# Patient Record
Sex: Male | Born: 1935 | Race: White | Hispanic: No | Marital: Married | Smoking: Never smoker
Health system: Southern US, Community
[De-identification: ages and names within clinical notes are randomized; demographics above are authoritative.]

## PROBLEM LIST (undated history)

## (undated) DIAGNOSIS — I639 Cerebral infarction, unspecified: Secondary | ICD-10-CM

## (undated) DIAGNOSIS — E119 Type 2 diabetes mellitus without complications: Secondary | ICD-10-CM

## (undated) DIAGNOSIS — C61 Malignant neoplasm of prostate: Secondary | ICD-10-CM

## (undated) DIAGNOSIS — C801 Malignant (primary) neoplasm, unspecified: Secondary | ICD-10-CM

## (undated) DIAGNOSIS — F32A Depression, unspecified: Secondary | ICD-10-CM

## (undated) DIAGNOSIS — F329 Major depressive disorder, single episode, unspecified: Secondary | ICD-10-CM

## (undated) HISTORY — DX: Type 2 diabetes mellitus without complications: E11.9

## (undated) HISTORY — DX: Cerebral infarction, unspecified: I63.9

## (undated) HISTORY — DX: Major depressive disorder, single episode, unspecified: F32.9

## (undated) HISTORY — PX: APPENDECTOMY: SHX54

## (undated) HISTORY — PX: CHOLECYSTECTOMY: SHX55

## (undated) HISTORY — DX: Malignant (primary) neoplasm, unspecified: C80.1

## (undated) HISTORY — PX: PROSTATECTOMY: SHX69

## (undated) HISTORY — DX: Depression, unspecified: F32.A

---

## 2014-11-19 ENCOUNTER — Encounter: Payer: Self-pay | Admitting: Family Medicine

## 2015-02-02 ENCOUNTER — Encounter: Payer: Self-pay | Admitting: Family Medicine

## 2015-04-13 ENCOUNTER — Encounter: Payer: Self-pay | Admitting: Family Medicine

## 2015-04-13 DIAGNOSIS — E118 Type 2 diabetes mellitus with unspecified complications: Secondary | ICD-10-CM | POA: Diagnosis not present

## 2015-04-13 DIAGNOSIS — Z9181 History of falling: Secondary | ICD-10-CM | POA: Diagnosis not present

## 2015-04-13 LAB — GLUCOSE, POCT (MANUAL RESULT ENTRY): POC Glucose: 130 mg/dl — AB (ref 70–99)

## 2015-05-14 DIAGNOSIS — I7091 Generalized atherosclerosis: Secondary | ICD-10-CM | POA: Diagnosis not present

## 2015-05-14 DIAGNOSIS — L602 Onychogryphosis: Secondary | ICD-10-CM | POA: Diagnosis not present

## 2015-05-14 DIAGNOSIS — B351 Tinea unguium: Secondary | ICD-10-CM | POA: Diagnosis not present

## 2015-05-14 DIAGNOSIS — M79674 Pain in right toe(s): Secondary | ICD-10-CM | POA: Diagnosis not present

## 2015-05-14 DIAGNOSIS — E119 Type 2 diabetes mellitus without complications: Secondary | ICD-10-CM | POA: Diagnosis not present

## 2015-05-14 DIAGNOSIS — M79675 Pain in left toe(s): Secondary | ICD-10-CM | POA: Diagnosis not present

## 2015-06-18 DIAGNOSIS — K1379 Other lesions of oral mucosa: Secondary | ICD-10-CM | POA: Diagnosis not present

## 2015-07-08 ENCOUNTER — Encounter: Payer: Self-pay | Admitting: Family Medicine

## 2015-07-08 DIAGNOSIS — E118 Type 2 diabetes mellitus with unspecified complications: Secondary | ICD-10-CM | POA: Diagnosis not present

## 2015-07-14 DIAGNOSIS — E1165 Type 2 diabetes mellitus with hyperglycemia: Secondary | ICD-10-CM | POA: Diagnosis not present

## 2015-07-14 DIAGNOSIS — I10 Essential (primary) hypertension: Secondary | ICD-10-CM | POA: Diagnosis not present

## 2015-07-16 DIAGNOSIS — L602 Onychogryphosis: Secondary | ICD-10-CM | POA: Diagnosis not present

## 2015-07-16 DIAGNOSIS — I7091 Generalized atherosclerosis: Secondary | ICD-10-CM | POA: Diagnosis not present

## 2015-07-16 DIAGNOSIS — M79674 Pain in right toe(s): Secondary | ICD-10-CM | POA: Diagnosis not present

## 2015-07-16 DIAGNOSIS — E119 Type 2 diabetes mellitus without complications: Secondary | ICD-10-CM | POA: Diagnosis not present

## 2015-07-16 DIAGNOSIS — M79675 Pain in left toe(s): Secondary | ICD-10-CM | POA: Diagnosis not present

## 2015-07-16 DIAGNOSIS — B351 Tinea unguium: Secondary | ICD-10-CM | POA: Diagnosis not present

## 2015-09-18 DIAGNOSIS — K5909 Other constipation: Secondary | ICD-10-CM | POA: Diagnosis not present

## 2015-09-18 DIAGNOSIS — Z86010 Personal history of colonic polyps: Secondary | ICD-10-CM | POA: Diagnosis not present

## 2015-09-18 DIAGNOSIS — Z800 Family history of malignant neoplasm of digestive organs: Secondary | ICD-10-CM | POA: Diagnosis not present

## 2015-09-22 DIAGNOSIS — M79674 Pain in right toe(s): Secondary | ICD-10-CM | POA: Diagnosis not present

## 2015-09-22 DIAGNOSIS — B351 Tinea unguium: Secondary | ICD-10-CM | POA: Diagnosis not present

## 2015-09-22 DIAGNOSIS — L602 Onychogryphosis: Secondary | ICD-10-CM | POA: Diagnosis not present

## 2015-09-22 DIAGNOSIS — M79675 Pain in left toe(s): Secondary | ICD-10-CM | POA: Diagnosis not present

## 2015-09-22 DIAGNOSIS — I7091 Generalized atherosclerosis: Secondary | ICD-10-CM | POA: Diagnosis not present

## 2015-09-22 DIAGNOSIS — E119 Type 2 diabetes mellitus without complications: Secondary | ICD-10-CM | POA: Diagnosis not present

## 2015-10-06 ENCOUNTER — Encounter: Payer: Self-pay | Admitting: Family Medicine

## 2015-10-06 DIAGNOSIS — C61 Malignant neoplasm of prostate: Secondary | ICD-10-CM | POA: Diagnosis not present

## 2015-10-06 DIAGNOSIS — E1165 Type 2 diabetes mellitus with hyperglycemia: Secondary | ICD-10-CM | POA: Diagnosis not present

## 2015-10-13 ENCOUNTER — Encounter: Payer: Self-pay | Admitting: Family Medicine

## 2015-10-13 DIAGNOSIS — R5383 Other fatigue: Secondary | ICD-10-CM | POA: Diagnosis not present

## 2015-10-13 DIAGNOSIS — I10 Essential (primary) hypertension: Secondary | ICD-10-CM | POA: Diagnosis not present

## 2015-10-13 DIAGNOSIS — E1165 Type 2 diabetes mellitus with hyperglycemia: Secondary | ICD-10-CM | POA: Diagnosis not present

## 2015-10-28 DIAGNOSIS — S20379A Other superficial bite of unspecified front wall of thorax, initial encounter: Secondary | ICD-10-CM | POA: Diagnosis not present

## 2015-10-28 DIAGNOSIS — E119 Type 2 diabetes mellitus without complications: Secondary | ICD-10-CM | POA: Diagnosis not present

## 2015-10-28 DIAGNOSIS — Z7982 Long term (current) use of aspirin: Secondary | ICD-10-CM | POA: Diagnosis not present

## 2015-10-28 DIAGNOSIS — S80861A Insect bite (nonvenomous), right lower leg, initial encounter: Secondary | ICD-10-CM | POA: Diagnosis not present

## 2015-10-28 DIAGNOSIS — Z7952 Long term (current) use of systemic steroids: Secondary | ICD-10-CM | POA: Diagnosis not present

## 2015-10-28 DIAGNOSIS — S80862A Insect bite (nonvenomous), left lower leg, initial encounter: Secondary | ICD-10-CM | POA: Diagnosis not present

## 2015-10-29 DIAGNOSIS — S0086XA Insect bite (nonvenomous) of other part of head, initial encounter: Secondary | ICD-10-CM | POA: Diagnosis not present

## 2015-10-29 DIAGNOSIS — W57XXXA Bitten or stung by nonvenomous insect and other nonvenomous arthropods, initial encounter: Secondary | ICD-10-CM | POA: Diagnosis not present

## 2015-10-29 DIAGNOSIS — Z9181 History of falling: Secondary | ICD-10-CM | POA: Diagnosis not present

## 2015-11-17 ENCOUNTER — Encounter: Payer: Self-pay | Admitting: Family Medicine

## 2015-11-17 DIAGNOSIS — K5900 Constipation, unspecified: Secondary | ICD-10-CM | POA: Diagnosis not present

## 2015-11-17 DIAGNOSIS — Z800 Family history of malignant neoplasm of digestive organs: Secondary | ICD-10-CM | POA: Diagnosis not present

## 2015-11-17 DIAGNOSIS — E119 Type 2 diabetes mellitus without complications: Secondary | ICD-10-CM | POA: Diagnosis not present

## 2015-11-17 DIAGNOSIS — Z794 Long term (current) use of insulin: Secondary | ICD-10-CM | POA: Diagnosis not present

## 2015-11-17 DIAGNOSIS — K5730 Diverticulosis of large intestine without perforation or abscess without bleeding: Secondary | ICD-10-CM | POA: Diagnosis not present

## 2015-11-17 DIAGNOSIS — Z86010 Personal history of colonic polyps: Secondary | ICD-10-CM | POA: Diagnosis not present

## 2015-11-24 DIAGNOSIS — B351 Tinea unguium: Secondary | ICD-10-CM | POA: Diagnosis not present

## 2015-11-24 DIAGNOSIS — M79675 Pain in left toe(s): Secondary | ICD-10-CM | POA: Diagnosis not present

## 2015-11-24 DIAGNOSIS — L602 Onychogryphosis: Secondary | ICD-10-CM | POA: Diagnosis not present

## 2015-11-24 DIAGNOSIS — I7091 Generalized atherosclerosis: Secondary | ICD-10-CM | POA: Diagnosis not present

## 2015-11-24 DIAGNOSIS — E119 Type 2 diabetes mellitus without complications: Secondary | ICD-10-CM | POA: Diagnosis not present

## 2015-11-24 DIAGNOSIS — M79674 Pain in right toe(s): Secondary | ICD-10-CM | POA: Diagnosis not present

## 2016-01-13 ENCOUNTER — Encounter: Payer: Self-pay | Admitting: Family Medicine

## 2016-01-13 DIAGNOSIS — E1165 Type 2 diabetes mellitus with hyperglycemia: Secondary | ICD-10-CM | POA: Diagnosis not present

## 2016-01-20 DIAGNOSIS — E1165 Type 2 diabetes mellitus with hyperglycemia: Secondary | ICD-10-CM | POA: Diagnosis not present

## 2016-01-20 DIAGNOSIS — Z794 Long term (current) use of insulin: Secondary | ICD-10-CM | POA: Diagnosis not present

## 2016-01-20 DIAGNOSIS — I10 Essential (primary) hypertension: Secondary | ICD-10-CM | POA: Diagnosis not present

## 2016-01-20 DIAGNOSIS — L821 Other seborrheic keratosis: Secondary | ICD-10-CM | POA: Diagnosis not present

## 2016-01-25 DIAGNOSIS — E1165 Type 2 diabetes mellitus with hyperglycemia: Secondary | ICD-10-CM | POA: Diagnosis not present

## 2016-01-25 DIAGNOSIS — E118 Type 2 diabetes mellitus with unspecified complications: Secondary | ICD-10-CM | POA: Diagnosis not present

## 2016-01-25 DIAGNOSIS — H40003 Preglaucoma, unspecified, bilateral: Secondary | ICD-10-CM | POA: Diagnosis not present

## 2016-01-26 DIAGNOSIS — E119 Type 2 diabetes mellitus without complications: Secondary | ICD-10-CM | POA: Diagnosis not present

## 2016-01-26 DIAGNOSIS — I7091 Generalized atherosclerosis: Secondary | ICD-10-CM | POA: Diagnosis not present

## 2016-01-26 DIAGNOSIS — B351 Tinea unguium: Secondary | ICD-10-CM | POA: Diagnosis not present

## 2016-01-26 DIAGNOSIS — L602 Onychogryphosis: Secondary | ICD-10-CM | POA: Diagnosis not present

## 2016-01-26 DIAGNOSIS — M79675 Pain in left toe(s): Secondary | ICD-10-CM | POA: Diagnosis not present

## 2016-01-26 DIAGNOSIS — M79674 Pain in right toe(s): Secondary | ICD-10-CM | POA: Diagnosis not present

## 2016-04-05 DIAGNOSIS — B351 Tinea unguium: Secondary | ICD-10-CM | POA: Diagnosis not present

## 2016-04-05 DIAGNOSIS — M79675 Pain in left toe(s): Secondary | ICD-10-CM | POA: Diagnosis not present

## 2016-04-05 DIAGNOSIS — L602 Onychogryphosis: Secondary | ICD-10-CM | POA: Diagnosis not present

## 2016-04-05 DIAGNOSIS — I7091 Generalized atherosclerosis: Secondary | ICD-10-CM | POA: Diagnosis not present

## 2016-04-05 DIAGNOSIS — E119 Type 2 diabetes mellitus without complications: Secondary | ICD-10-CM | POA: Diagnosis not present

## 2016-04-05 DIAGNOSIS — M79674 Pain in right toe(s): Secondary | ICD-10-CM | POA: Diagnosis not present

## 2016-04-14 ENCOUNTER — Encounter: Payer: Self-pay | Admitting: Family Medicine

## 2016-04-14 DIAGNOSIS — E1165 Type 2 diabetes mellitus with hyperglycemia: Secondary | ICD-10-CM | POA: Diagnosis not present

## 2016-04-21 DIAGNOSIS — E1165 Type 2 diabetes mellitus with hyperglycemia: Secondary | ICD-10-CM | POA: Diagnosis not present

## 2016-04-21 DIAGNOSIS — E039 Hypothyroidism, unspecified: Secondary | ICD-10-CM | POA: Diagnosis not present

## 2016-06-07 DIAGNOSIS — L602 Onychogryphosis: Secondary | ICD-10-CM | POA: Diagnosis not present

## 2016-06-07 DIAGNOSIS — M79675 Pain in left toe(s): Secondary | ICD-10-CM | POA: Diagnosis not present

## 2016-06-07 DIAGNOSIS — M79674 Pain in right toe(s): Secondary | ICD-10-CM | POA: Diagnosis not present

## 2016-06-07 DIAGNOSIS — E119 Type 2 diabetes mellitus without complications: Secondary | ICD-10-CM | POA: Diagnosis not present

## 2016-06-07 DIAGNOSIS — B351 Tinea unguium: Secondary | ICD-10-CM | POA: Diagnosis not present

## 2016-06-07 DIAGNOSIS — I7091 Generalized atherosclerosis: Secondary | ICD-10-CM | POA: Diagnosis not present

## 2016-06-08 DIAGNOSIS — D485 Neoplasm of uncertain behavior of skin: Secondary | ICD-10-CM | POA: Diagnosis not present

## 2016-06-08 DIAGNOSIS — L659 Nonscarring hair loss, unspecified: Secondary | ICD-10-CM | POA: Diagnosis not present

## 2016-06-08 DIAGNOSIS — L817 Pigmented purpuric dermatosis: Secondary | ICD-10-CM | POA: Diagnosis not present

## 2016-06-08 DIAGNOSIS — L821 Other seborrheic keratosis: Secondary | ICD-10-CM | POA: Diagnosis not present

## 2016-06-08 DIAGNOSIS — D3610 Benign neoplasm of peripheral nerves and autonomic nervous system, unspecified: Secondary | ICD-10-CM | POA: Diagnosis not present

## 2016-06-08 DIAGNOSIS — L918 Other hypertrophic disorders of the skin: Secondary | ICD-10-CM | POA: Diagnosis not present

## 2016-06-08 DIAGNOSIS — L853 Xerosis cutis: Secondary | ICD-10-CM | POA: Diagnosis not present

## 2016-06-08 DIAGNOSIS — L851 Acquired keratosis [keratoderma] palmaris et plantaris: Secondary | ICD-10-CM | POA: Diagnosis not present

## 2016-06-08 DIAGNOSIS — L570 Actinic keratosis: Secondary | ICD-10-CM | POA: Diagnosis not present

## 2016-06-08 DIAGNOSIS — L814 Other melanin hyperpigmentation: Secondary | ICD-10-CM | POA: Diagnosis not present

## 2016-06-08 DIAGNOSIS — L812 Freckles: Secondary | ICD-10-CM | POA: Diagnosis not present

## 2016-06-08 DIAGNOSIS — L578 Other skin changes due to chronic exposure to nonionizing radiation: Secondary | ICD-10-CM | POA: Diagnosis not present

## 2016-07-20 ENCOUNTER — Encounter: Payer: Self-pay | Admitting: Family Medicine

## 2016-07-20 DIAGNOSIS — E1165 Type 2 diabetes mellitus with hyperglycemia: Secondary | ICD-10-CM | POA: Diagnosis not present

## 2016-07-27 DIAGNOSIS — E1165 Type 2 diabetes mellitus with hyperglycemia: Secondary | ICD-10-CM | POA: Diagnosis not present

## 2016-07-27 DIAGNOSIS — N5234 Erectile dysfunction following simple prostatectomy: Secondary | ICD-10-CM | POA: Diagnosis not present

## 2016-07-27 DIAGNOSIS — E039 Hypothyroidism, unspecified: Secondary | ICD-10-CM | POA: Diagnosis not present

## 2016-07-27 DIAGNOSIS — Z794 Long term (current) use of insulin: Secondary | ICD-10-CM | POA: Diagnosis not present

## 2016-08-16 DIAGNOSIS — B351 Tinea unguium: Secondary | ICD-10-CM | POA: Diagnosis not present

## 2016-08-16 DIAGNOSIS — I7091 Generalized atherosclerosis: Secondary | ICD-10-CM | POA: Diagnosis not present

## 2016-08-16 DIAGNOSIS — M79675 Pain in left toe(s): Secondary | ICD-10-CM | POA: Diagnosis not present

## 2016-08-16 DIAGNOSIS — E119 Type 2 diabetes mellitus without complications: Secondary | ICD-10-CM | POA: Diagnosis not present

## 2016-08-16 DIAGNOSIS — M79674 Pain in right toe(s): Secondary | ICD-10-CM | POA: Diagnosis not present

## 2016-08-16 DIAGNOSIS — L602 Onychogryphosis: Secondary | ICD-10-CM | POA: Diagnosis not present

## 2016-09-01 DIAGNOSIS — N5089 Other specified disorders of the male genital organs: Secondary | ICD-10-CM | POA: Diagnosis not present

## 2016-09-09 ENCOUNTER — Encounter: Payer: Self-pay | Admitting: Family Medicine

## 2016-09-09 DIAGNOSIS — N5089 Other specified disorders of the male genital organs: Secondary | ICD-10-CM | POA: Diagnosis not present

## 2016-09-09 DIAGNOSIS — N433 Hydrocele, unspecified: Secondary | ICD-10-CM | POA: Diagnosis not present

## 2016-09-13 DIAGNOSIS — N5231 Erectile dysfunction following radical prostatectomy: Secondary | ICD-10-CM | POA: Diagnosis not present

## 2016-09-13 DIAGNOSIS — Z8546 Personal history of malignant neoplasm of prostate: Secondary | ICD-10-CM | POA: Diagnosis not present

## 2016-10-18 DIAGNOSIS — E119 Type 2 diabetes mellitus without complications: Secondary | ICD-10-CM | POA: Diagnosis not present

## 2016-10-18 DIAGNOSIS — B351 Tinea unguium: Secondary | ICD-10-CM | POA: Diagnosis not present

## 2016-10-18 DIAGNOSIS — M79674 Pain in right toe(s): Secondary | ICD-10-CM | POA: Diagnosis not present

## 2016-10-18 DIAGNOSIS — I7091 Generalized atherosclerosis: Secondary | ICD-10-CM | POA: Diagnosis not present

## 2016-10-18 DIAGNOSIS — M79675 Pain in left toe(s): Secondary | ICD-10-CM | POA: Diagnosis not present

## 2016-10-18 DIAGNOSIS — L602 Onychogryphosis: Secondary | ICD-10-CM | POA: Diagnosis not present

## 2016-11-01 ENCOUNTER — Encounter: Payer: Self-pay | Admitting: Family Medicine

## 2016-11-01 DIAGNOSIS — E1165 Type 2 diabetes mellitus with hyperglycemia: Secondary | ICD-10-CM | POA: Diagnosis not present

## 2016-12-05 DIAGNOSIS — R918 Other nonspecific abnormal finding of lung field: Secondary | ICD-10-CM | POA: Diagnosis not present

## 2016-12-05 DIAGNOSIS — Z9079 Acquired absence of other genital organ(s): Secondary | ICD-10-CM | POA: Diagnosis not present

## 2016-12-05 DIAGNOSIS — C61 Malignant neoplasm of prostate: Secondary | ICD-10-CM | POA: Diagnosis not present

## 2016-12-05 DIAGNOSIS — Z79899 Other long term (current) drug therapy: Secondary | ICD-10-CM | POA: Diagnosis not present

## 2016-12-05 DIAGNOSIS — H409 Unspecified glaucoma: Secondary | ICD-10-CM | POA: Diagnosis not present

## 2016-12-05 DIAGNOSIS — I639 Cerebral infarction, unspecified: Secondary | ICD-10-CM | POA: Diagnosis not present

## 2016-12-05 DIAGNOSIS — E118 Type 2 diabetes mellitus with unspecified complications: Secondary | ICD-10-CM | POA: Diagnosis not present

## 2016-12-05 DIAGNOSIS — R51 Headache: Secondary | ICD-10-CM | POA: Diagnosis not present

## 2016-12-05 DIAGNOSIS — N401 Enlarged prostate with lower urinary tract symptoms: Secondary | ICD-10-CM | POA: Diagnosis not present

## 2016-12-05 DIAGNOSIS — I6782 Cerebral ischemia: Secondary | ICD-10-CM | POA: Diagnosis not present

## 2016-12-05 DIAGNOSIS — Z800 Family history of malignant neoplasm of digestive organs: Secondary | ICD-10-CM | POA: Diagnosis not present

## 2016-12-05 DIAGNOSIS — E119 Type 2 diabetes mellitus without complications: Secondary | ICD-10-CM | POA: Diagnosis not present

## 2016-12-05 DIAGNOSIS — Z8546 Personal history of malignant neoplasm of prostate: Secondary | ICD-10-CM | POA: Diagnosis not present

## 2016-12-05 DIAGNOSIS — Z7984 Long term (current) use of oral hypoglycemic drugs: Secondary | ICD-10-CM | POA: Diagnosis not present

## 2016-12-05 DIAGNOSIS — G459 Transient cerebral ischemic attack, unspecified: Secondary | ICD-10-CM | POA: Diagnosis not present

## 2016-12-05 DIAGNOSIS — I160 Hypertensive urgency: Secondary | ICD-10-CM | POA: Diagnosis not present

## 2016-12-05 DIAGNOSIS — M6281 Muscle weakness (generalized): Secondary | ICD-10-CM | POA: Diagnosis not present

## 2016-12-05 DIAGNOSIS — R55 Syncope and collapse: Secondary | ICD-10-CM | POA: Diagnosis not present

## 2016-12-05 DIAGNOSIS — I6300 Cerebral infarction due to thrombosis of unspecified precerebral artery: Secondary | ICD-10-CM | POA: Diagnosis not present

## 2016-12-05 DIAGNOSIS — I10 Essential (primary) hypertension: Secondary | ICD-10-CM | POA: Diagnosis not present

## 2016-12-05 DIAGNOSIS — Z833 Family history of diabetes mellitus: Secondary | ICD-10-CM | POA: Diagnosis not present

## 2016-12-05 DIAGNOSIS — Z9010 Acquired absence of unspecified breast and nipple: Secondary | ICD-10-CM | POA: Diagnosis not present

## 2016-12-05 DIAGNOSIS — R200 Anesthesia of skin: Secondary | ICD-10-CM | POA: Diagnosis not present

## 2016-12-05 DIAGNOSIS — J90 Pleural effusion, not elsewhere classified: Secondary | ICD-10-CM | POA: Diagnosis not present

## 2016-12-05 DIAGNOSIS — H53461 Homonymous bilateral field defects, right side: Secondary | ICD-10-CM | POA: Diagnosis present

## 2016-12-05 DIAGNOSIS — S2239XS Fracture of one rib, unspecified side, sequela: Secondary | ICD-10-CM | POA: Diagnosis not present

## 2016-12-05 DIAGNOSIS — Z823 Family history of stroke: Secondary | ICD-10-CM | POA: Diagnosis not present

## 2016-12-05 DIAGNOSIS — R29700 NIHSS score 0: Secondary | ICD-10-CM | POA: Diagnosis present

## 2016-12-05 DIAGNOSIS — R4781 Slurred speech: Secondary | ICD-10-CM | POA: Diagnosis not present

## 2016-12-05 DIAGNOSIS — T1490XA Injury, unspecified, initial encounter: Secondary | ICD-10-CM | POA: Diagnosis not present

## 2016-12-05 DIAGNOSIS — Z7982 Long term (current) use of aspirin: Secondary | ICD-10-CM | POA: Diagnosis not present

## 2016-12-05 DIAGNOSIS — R32 Unspecified urinary incontinence: Secondary | ICD-10-CM | POA: Diagnosis present

## 2016-12-06 ENCOUNTER — Encounter: Payer: Self-pay | Admitting: Family Medicine

## 2016-12-09 DIAGNOSIS — H409 Unspecified glaucoma: Secondary | ICD-10-CM | POA: Diagnosis not present

## 2016-12-09 DIAGNOSIS — D691 Qualitative platelet defects: Secondary | ICD-10-CM | POA: Diagnosis not present

## 2016-12-09 DIAGNOSIS — Z9889 Other specified postprocedural states: Secondary | ICD-10-CM | POA: Diagnosis not present

## 2016-12-09 DIAGNOSIS — E785 Hyperlipidemia, unspecified: Secondary | ICD-10-CM | POA: Diagnosis not present

## 2016-12-09 DIAGNOSIS — I63529 Cerebral infarction due to unspecified occlusion or stenosis of unspecified anterior cerebral artery: Secondary | ICD-10-CM | POA: Diagnosis not present

## 2016-12-09 DIAGNOSIS — Z7984 Long term (current) use of oral hypoglycemic drugs: Secondary | ICD-10-CM | POA: Diagnosis not present

## 2016-12-09 DIAGNOSIS — I10 Essential (primary) hypertension: Secondary | ICD-10-CM | POA: Diagnosis not present

## 2016-12-09 DIAGNOSIS — T1490XA Injury, unspecified, initial encounter: Secondary | ICD-10-CM | POA: Diagnosis not present

## 2016-12-09 DIAGNOSIS — R51 Headache: Secondary | ICD-10-CM | POA: Diagnosis not present

## 2016-12-09 DIAGNOSIS — E119 Type 2 diabetes mellitus without complications: Secondary | ICD-10-CM | POA: Diagnosis not present

## 2016-12-09 DIAGNOSIS — C61 Malignant neoplasm of prostate: Secondary | ICD-10-CM | POA: Diagnosis not present

## 2016-12-09 DIAGNOSIS — Z743 Need for continuous supervision: Secondary | ICD-10-CM | POA: Diagnosis not present

## 2016-12-09 DIAGNOSIS — R079 Chest pain, unspecified: Secondary | ICD-10-CM | POA: Diagnosis not present

## 2016-12-09 DIAGNOSIS — Z7902 Long term (current) use of antithrombotics/antiplatelets: Secondary | ICD-10-CM | POA: Diagnosis not present

## 2016-12-09 DIAGNOSIS — I6300 Cerebral infarction due to thrombosis of unspecified precerebral artery: Secondary | ICD-10-CM | POA: Diagnosis not present

## 2016-12-09 DIAGNOSIS — M6281 Muscle weakness (generalized): Secondary | ICD-10-CM | POA: Diagnosis not present

## 2016-12-09 DIAGNOSIS — I639 Cerebral infarction, unspecified: Secondary | ICD-10-CM | POA: Diagnosis not present

## 2016-12-09 DIAGNOSIS — R55 Syncope and collapse: Secondary | ICD-10-CM | POA: Diagnosis not present

## 2016-12-09 DIAGNOSIS — S2239XS Fracture of one rib, unspecified side, sequela: Secondary | ICD-10-CM | POA: Diagnosis not present

## 2016-12-10 DIAGNOSIS — I10 Essential (primary) hypertension: Secondary | ICD-10-CM | POA: Diagnosis not present

## 2016-12-10 DIAGNOSIS — I63529 Cerebral infarction due to unspecified occlusion or stenosis of unspecified anterior cerebral artery: Secondary | ICD-10-CM | POA: Diagnosis not present

## 2016-12-10 DIAGNOSIS — Z7902 Long term (current) use of antithrombotics/antiplatelets: Secondary | ICD-10-CM | POA: Diagnosis not present

## 2016-12-10 DIAGNOSIS — I6300 Cerebral infarction due to thrombosis of unspecified precerebral artery: Secondary | ICD-10-CM | POA: Diagnosis not present

## 2016-12-10 DIAGNOSIS — Z7984 Long term (current) use of oral hypoglycemic drugs: Secondary | ICD-10-CM | POA: Diagnosis not present

## 2016-12-10 DIAGNOSIS — R55 Syncope and collapse: Secondary | ICD-10-CM | POA: Diagnosis not present

## 2016-12-10 DIAGNOSIS — D691 Qualitative platelet defects: Secondary | ICD-10-CM | POA: Diagnosis not present

## 2016-12-10 DIAGNOSIS — R51 Headache: Secondary | ICD-10-CM | POA: Diagnosis not present

## 2016-12-10 DIAGNOSIS — E119 Type 2 diabetes mellitus without complications: Secondary | ICD-10-CM | POA: Diagnosis not present

## 2016-12-10 DIAGNOSIS — Z9889 Other specified postprocedural states: Secondary | ICD-10-CM | POA: Diagnosis not present

## 2016-12-10 DIAGNOSIS — T1490XA Injury, unspecified, initial encounter: Secondary | ICD-10-CM | POA: Diagnosis not present

## 2016-12-13 DIAGNOSIS — E785 Hyperlipidemia, unspecified: Secondary | ICD-10-CM | POA: Diagnosis not present

## 2016-12-13 DIAGNOSIS — I10 Essential (primary) hypertension: Secondary | ICD-10-CM | POA: Diagnosis not present

## 2016-12-13 DIAGNOSIS — I639 Cerebral infarction, unspecified: Secondary | ICD-10-CM | POA: Diagnosis not present

## 2016-12-13 DIAGNOSIS — E119 Type 2 diabetes mellitus without complications: Secondary | ICD-10-CM | POA: Diagnosis not present

## 2016-12-16 DIAGNOSIS — E785 Hyperlipidemia, unspecified: Secondary | ICD-10-CM | POA: Diagnosis not present

## 2016-12-16 DIAGNOSIS — E119 Type 2 diabetes mellitus without complications: Secondary | ICD-10-CM | POA: Diagnosis not present

## 2016-12-16 DIAGNOSIS — I10 Essential (primary) hypertension: Secondary | ICD-10-CM | POA: Diagnosis not present

## 2016-12-16 DIAGNOSIS — I639 Cerebral infarction, unspecified: Secondary | ICD-10-CM | POA: Diagnosis not present

## 2016-12-19 DIAGNOSIS — I639 Cerebral infarction, unspecified: Secondary | ICD-10-CM | POA: Diagnosis not present

## 2016-12-19 DIAGNOSIS — Z9181 History of falling: Secondary | ICD-10-CM | POA: Diagnosis not present

## 2016-12-19 DIAGNOSIS — E1165 Type 2 diabetes mellitus with hyperglycemia: Secondary | ICD-10-CM | POA: Diagnosis not present

## 2016-12-19 DIAGNOSIS — Z794 Long term (current) use of insulin: Secondary | ICD-10-CM | POA: Diagnosis not present

## 2016-12-20 DIAGNOSIS — B351 Tinea unguium: Secondary | ICD-10-CM | POA: Diagnosis not present

## 2016-12-20 DIAGNOSIS — M79674 Pain in right toe(s): Secondary | ICD-10-CM | POA: Diagnosis not present

## 2016-12-20 DIAGNOSIS — L602 Onychogryphosis: Secondary | ICD-10-CM | POA: Diagnosis not present

## 2016-12-20 DIAGNOSIS — I7091 Generalized atherosclerosis: Secondary | ICD-10-CM | POA: Diagnosis not present

## 2016-12-20 DIAGNOSIS — M79675 Pain in left toe(s): Secondary | ICD-10-CM | POA: Diagnosis not present

## 2016-12-20 DIAGNOSIS — E119 Type 2 diabetes mellitus without complications: Secondary | ICD-10-CM | POA: Diagnosis not present

## 2017-01-03 DIAGNOSIS — I639 Cerebral infarction, unspecified: Secondary | ICD-10-CM | POA: Diagnosis not present

## 2017-01-03 DIAGNOSIS — Z794 Long term (current) use of insulin: Secondary | ICD-10-CM | POA: Diagnosis not present

## 2017-01-03 DIAGNOSIS — E1165 Type 2 diabetes mellitus with hyperglycemia: Secondary | ICD-10-CM | POA: Diagnosis not present

## 2017-01-03 DIAGNOSIS — Z23 Encounter for immunization: Secondary | ICD-10-CM | POA: Diagnosis not present

## 2017-01-12 DIAGNOSIS — I69354 Hemiplegia and hemiparesis following cerebral infarction affecting left non-dominant side: Secondary | ICD-10-CM | POA: Diagnosis not present

## 2017-01-18 ENCOUNTER — Encounter: Payer: Self-pay | Admitting: Family Medicine

## 2017-01-18 DIAGNOSIS — E1165 Type 2 diabetes mellitus with hyperglycemia: Secondary | ICD-10-CM | POA: Diagnosis not present

## 2017-02-07 DIAGNOSIS — N393 Stress incontinence (female) (male): Secondary | ICD-10-CM | POA: Diagnosis not present

## 2017-02-07 DIAGNOSIS — R9721 Rising PSA following treatment for malignant neoplasm of prostate: Secondary | ICD-10-CM | POA: Diagnosis not present

## 2017-02-12 ENCOUNTER — Emergency Department
Admission: EM | Admit: 2017-02-12 | Discharge: 2017-02-12 | Disposition: A | Discharge status: Home / Self Care | Payer: Medicare Other | Attending: Emergency Medicine | Admitting: Emergency Medicine

## 2017-02-12 DIAGNOSIS — G629 Polyneuropathy, unspecified: Secondary | ICD-10-CM

## 2017-02-12 DIAGNOSIS — E118 Type 2 diabetes mellitus with unspecified complications: Secondary | ICD-10-CM

## 2017-02-12 DIAGNOSIS — E1140 Type 2 diabetes mellitus with diabetic neuropathy, unspecified: Secondary | ICD-10-CM | POA: Insufficient documentation

## 2017-02-12 DIAGNOSIS — Z9119 Patient's noncompliance with other medical treatment and regimen: Secondary | ICD-10-CM | POA: Insufficient documentation

## 2017-02-12 DIAGNOSIS — E1165 Type 2 diabetes mellitus with hyperglycemia: Secondary | ICD-10-CM | POA: Insufficient documentation

## 2017-02-12 DIAGNOSIS — R739 Hyperglycemia, unspecified: Secondary | ICD-10-CM

## 2017-02-12 DIAGNOSIS — Z91199 Patient's noncompliance with other medical treatment and regimen due to unspecified reason: Secondary | ICD-10-CM

## 2017-02-12 LAB — CBC AND DIFFERENTIAL
Basophils %: 0.3 % (ref 0.0–3.0)
Basophils Absolute: 0 10*3/uL (ref 0.0–0.3)
Eosinophils %: 5.6 % (ref 0.0–7.0)
Eosinophils Absolute: 0.4 10*3/uL (ref 0.0–0.8)
Hematocrit: 38.9 % — ABNORMAL LOW (ref 39.0–52.5)
Hemoglobin: 13.2 gm/dL (ref 13.0–17.5)
Lymphocytes Absolute: 2.1 10*3/uL (ref 0.6–5.1)
Lymphocytes: 26.7 % (ref 15.0–46.0)
MCH: 34 pg (ref 28–35)
MCHC: 34 gm/dL (ref 32–36)
MCV: 101 fL — ABNORMAL HIGH (ref 80–100)
MPV: 7.6 fL (ref 6.0–10.0)
Monocytes Absolute: 0.5 10*3/uL (ref 0.1–1.7)
Monocytes: 6.8 % (ref 3.0–15.0)
Neutrophils %: 60.6 % (ref 42.0–78.0)
Neutrophils Absolute: 4.7 10*3/uL (ref 1.7–8.6)
PLT CT: 159 10*3/uL (ref 130–440)
RBC: 3.86 10*6/uL — ABNORMAL LOW (ref 4.00–5.70)
RDW: 12.4 % (ref 11.0–14.0)
WBC: 7.8 10*3/uL (ref 4.0–11.0)

## 2017-02-12 LAB — COMPREHENSIVE METABOLIC PANEL
ALT: 30 U/L (ref 0–55)
AST (SGOT): 21 U/L (ref 10–42)
Albumin/Globulin Ratio: 0.97 Ratio (ref 0.70–1.50)
Albumin: 3.3 gm/dL — ABNORMAL LOW (ref 3.5–5.0)
Alkaline Phosphatase: 65 U/L (ref 40–145)
Anion Gap: 13.4 mMol/L (ref 7.0–18.0)
BUN / Creatinine Ratio: 17.5 Ratio (ref 10.0–30.0)
BUN: 22 mg/dL (ref 7–22)
Bilirubin, Total: 0.4 mg/dL (ref 0.1–1.2)
CO2: 22.7 mMol/L (ref 20.0–30.0)
Calcium: 9.3 mg/dL (ref 8.5–10.5)
Chloride: 109 mMol/L (ref 98–110)
Creatinine: 1.26 mg/dL (ref 0.80–1.30)
EGFR: 53 mL/min/{1.73_m2} — ABNORMAL LOW (ref 60–150)
Globulin: 3.4 gm/dL (ref 2.0–4.0)
Glucose: 172 mg/dL — ABNORMAL HIGH (ref 71–99)
Osmolality Calc: 289 mOsm/kg (ref 275–300)
Potassium: 4.1 mMol/L (ref 3.5–5.3)
Protein, Total: 6.7 gm/dL (ref 6.0–8.3)
Sodium: 141 mMol/L (ref 136–147)

## 2017-02-12 LAB — VH DEXTROSE STICK GLUCOSE: Glucose POCT: 251 mg/dL — ABNORMAL HIGH (ref 71–99)

## 2017-02-12 MED ORDER — SODIUM CHLORIDE 0.9 % IV BOLUS
1000.0000 mL | Freq: Once | INTRAVENOUS | Status: AC
Start: 2017-02-12 — End: 2017-02-12
  Administered 2017-02-12: 01:00:00 1000 mL via INTRAVENOUS

## 2017-02-12 NOTE — Discharge Instructions (Signed)
Exercise to Manage Your Blood Sugar    Being physically active every day can help you manage your blood sugar. That's because an active lifestyle can improve your body's ability to use insulin. Daily activity can also help delay or prevent complications of diabetes. And it's a great way to relieve stress. If you aren't normally active, be sure to consult your healthcare provider before getting started.  How much activity do you need?  If daily activity is new to you, start slow and steady. Begin with10minutes of activity each day. Then work up to at least 150 minutes a week of physical activity. Don't let more than 2 days go by without being active. When sitting for long periods of time, get up for short sessions of light activity every 30 minutes  Just move!  You don't have to join a gym or own pricey sports equipment. Just get out and walk. Walking is an aerobic exercise that makes your heart and lungs work hard. It helps your heart and blood vessels. Walking needs only a sturdy pair of sneakers and your own two feet. The more you walk, the easier it gets:   Schedule time every day to move your feet.   Make it part of your daily routine.   Walk with a friend or a group to keep it interesting and fun.   Try taking several short walks during the day to meet your daily activity goal.  A pedometer makes every step count  A pedometer is a small device that keeps track of how many steps you take. You can clip it to your belt (or a strap on your arm or leg) and go about your daily routine. "Smartphones" now also have apps to record your walking.At the end of the day, the pedometer shows the total number of steps you took. Use a pedometer to set daily goals for yourself. For instance, if you walk4,000 steps a day, try adding 200 more steps each day. Aim for a goal of7,500. With every step, you're doing a little more to help your body use insulin.   Adding resistance exercise  Resistance exercise (also called  strength training), makes muscles stronger. It also helps muscles use insulin better. Ask your healthcare provider whether this type of exercise is right for you. If it is, your healthcare provider can help you work it in to your activity plan.  Staying safe  Being active may cause blood sugar to drop faster than usual. This is especially true if you take medicine to manage your blood sugar. But there are things you can do to help reduce the risk of accidental lows. Keep these tips in mind:   Always carry identification when you exercise outside your home. Carry a cell phone to use in case of emergency.   If you can, include friends and family in your activities.   Wear a medical ID bracelet that says you have diabetes.   Use the right safety equipment for the activity you do (such as a bicycle helmet when you ride a bicycle outdoors). Wear closed-toed shoes that fit your feet well.   Drink plenty of water before and during activity.   Keep a fast-acting sugar (such as glucose tablets) on hand in case of low blood sugar.   Dress properly for the weather. Wear a hat if it's sunny, or wait until evening if it's too hot.   Avoid being active for long periods in very hot or very cold weather.     Skip activity if you're sick.    Notice how activity affects blood sugar  Physical activity is important when you have diabetes. But you need to keep an eye on your blood sugar level. Check often if you have been active for longer than usual, or if the activity was unplanned. Make it a habit to check your blood sugar before being active. And check again a few hours later. Use your log book to write down how activity affects your numbers. If you take insulin, you may be able to adjust your dose before a planned activity. This can help prevent lows. You may also need to take a small carbohydrate snack before the exercise. Talk to your healthcare provider to learn more.   Date Last Reviewed: 08/27/2014   2000-2018 The  [employer]. [address], [place] [address]. All rights reserved. This information is not intended as a substitute for professional medical care. Always follow your healthcare professional's instructions.          Using a Blood Sugar Log    You have diabetes. This means your body has troubleregulating a sugar called glucose. To help manage your diabetes, you'll need to check your blood sugar level as directed by your healthcare provider. Keeping a log of your blood sugar levels will help you track your blood sugar readings. It's a simple and easy way to see how well you are controlling your diabetes.  Checking your blood sugar level  You can check your blood sugar level with a blood glucose meter. You'll first prick the side of your finger with a tiny lancet to draw a tiny drop of blood onto the test strip. Some glucose meters let you use another place on your body to test. But these other places should not be used in some cases as they may be inaccurate. Follow the instructions for your glucose meter. And talk with your healthcare provider before doing the test on other places.  The strip goes into the meter first, then a drop of blood is placed on the tip of the strip. The meter then shows a reading that tells you the level of your blood sugar. Your readings should be in your target range as often as possible. This means not too high or too low. Staying in this range helps lower your risk for complications. Your healthcare provider will help you figure out the target range that is best for you.  Tracking your readings  Every time you check your blood sugar, use your log to keep track of your readings. Your meter will also probably have a memory feature that your healthcare provider can check at your next visit. You may be advised by your healthcare provider to check your blood sugar in the morning, at bedtime, and before and after meals. Be sure to write down all of your numbers. Also use  your log to record things that might have affected your blood sugar. Some examples include being sick, certain medicines, being physically active, feeling stressed, or skipping meals.  Lessons learned from your readings  Tracking your blood sugar readings helps you see patterns. These patterns tell you how your actions affect your blood sugar. For instance, you may have higher numbers after eating certain foods or lower numbers after exercise. They just help you understand how to stay in your target range more often, so that your diabetes remains in good control.  Sharing your log with your healthcare team  Bring your blood sugar  log and glucose meter with you to all of your healthcare appointments. This can help your healthcare team make changes to your treatment plan, if needed. This may involve making changes in what you eat, what medicines you take, or how much you exercise.  To learn more  The resources below can help you learn more:   American Diabetes Association800-342-2383www.diabetes.org   [employer]800-829-0500www.lighthouse.org   [employer]301-7540094553 [place]   [employer] [phone] www.hormone.org  Date Last Reviewed: 07/27/2014   2000-2018 [employer], [place]. [address] [place], [place] [address]. All rights reserved. This information is not intended as a substitute for professional medical care. Always follow your healthcare professional's instructions.

## 2017-02-12 NOTE — ED Notes (Signed)
Pt provided discharge instructions and education. No further questions, complaints, or concerns from pt. Pt states understanding. Breathing even/unlabored. Color appropriate. A & O X 4. Discharged via wheelchair. Speaking with complete sentences. Pt left ED with all belongings. IV removed and clear on inspection.

## 2017-02-12 NOTE — ED Provider Notes (Signed)
[facility] EMERGENCY DEPARTMENT History and Physical Exam      Patient Name: Devin Sawyer, Devin Sawyer  Encounter Date:  02/12/2017  Attending Physician: Joesphine Bare, MD  PCP: Christa See, MD  Patient DOB:  June 19, 1933  MRN:  [id number]  Room:  N1/N1-A      History of Presenting Illness   "I feel my sugar is up"  Chief complaint: Hyperglycemia    HPI/ROS is limited by: none  HPI/ROS given by: patient and family    Location generalized  Duration: Today  Severity: mild    Devin Sawyer is a 83 y.o. male who presents with concerns that "my sugar is up" patient's traveling from [place] to [place]. The patient is concerned his blood sugars up. His wife states "he never checks it" patient is a type II diabetic. He notes a previous stroke. He denies any focal deficits. At times he has some burning sensation in the right hand but this is unchanged since his stroke. She denies any dysarthria. This been no visual changes no diplopia no scotomata. He's had increased thirst. There's been no history of any odontophagia no dysphagia. No current headache. No chest pain or shortness of breath. He denies any leg pain or leg swelling. He's otherwise been able to perform his activities daily living. He states "it is only get my sugar checked"      Review of Systems     Review of Systems   Constitutional: Negative.  Negative for chills and fever.   HENT: Negative.    Eyes: Negative for blurred vision, double vision and photophobia.   Respiratory: Negative.  Negative for cough and shortness of breath.    Cardiovascular: Negative.  Negative for chest pain.   Gastrointestinal: Negative.  Negative for abdominal pain, blood in stool, constipation and diarrhea.   Genitourinary: Negative.  Negative for dysuria, frequency, hematuria and urgency.   Musculoskeletal: Negative.  Negative for joint pain and myalgias.   Skin: Negative.  Negative for rash.   Neurological: Negative for dizziness, sensory change, focal weakness,  loss of consciousness and headaches.   Endo/Heme/Allergies: Positive for polydipsia.   Psychiatric/Behavioral: Negative.    All other systems reviewed and are negative.      Allergies     [name] allergic to morphine; motrin [ibuprofen]; and sulfa antibiotics.    Medications     No current facility-administered medications for this encounter.   No current outpatient prescriptions on file.   Medications were reviewed by md  Past Medical History     Pt  Past Medical History:   Diagnosis Date   . Diabetes mellitus    . Stroke september 10th 2018     Past medical history was reviewed by md  Past Surgical History     Pt  Past Surgical History:   Procedure Laterality Date   . APPENDECTOMY     . CHOLECYSTECTOMY     . PROSTATE SURGERY       Past surgical history was reviewed by md  Family History   History reviewed. No pertinent family history.  The family history was reviewed by md  Social History     Pt  Social History     Social History   . Marital status: Married     Spouse name: N/A   . Number of children: N/A   . Years of education: N/A     Social History Main Topics   . Smoking status: Never Smoker   . Smokeless  tobacco: Never Used   . Alcohol use No   . Drug use: No   . Sexual activity: Not Asked     Other Topics Concern   . None     Social History Narrative   . None     Social history reviewed by md  Physical Exam     Blood pressure 137/79, pulse 72, temperature 97.5 F (36.4 C), temperature source Tympanic, resp. rate 17, weight 97 kg, SpO2 98 %.    Physical Exam   Constitutional: He is oriented to person, place, and time and well-developed, well-nourished, and in no distress. No distress.   Patient is sitting upright. They make good eye contact. They are interactive. They are conversing without difficulty and in no acute apparent distress.     HENT:   Head: Normocephalic and atraumatic.   Right Ear: External ear normal.   Left Ear: External ear normal.   Nose: Nose normal.   Slightly dry mucous membranes   Eyes:  Pupils are equal, round, and reactive to light. Conjunctivae and EOM are normal.   Neck: Normal range of motion. Neck supple. No JVD present. No tracheal deviation present. No thyromegaly present.   Cardiovascular: Normal rate, regular rhythm, normal heart sounds and intact distal pulses.  Exam reveals no gallop and no friction rub.    No murmur heard.  Regular rhythm without murmur rubs or gallops. No bruits. Good peripheral pulses. Good cap refill. Symmetrical pulses. No abdominal bruits     Pulmonary/Chest: Effort normal and breath sounds normal. No stridor. No respiratory distress. He has no wheezes. He has no rales. He exhibits no tenderness.   Abdominal: Soft. Bowel sounds are normal. He exhibits no distension and no mass. There is no tenderness. There is no rebound and no guarding.   Musculoskeletal: Normal range of motion. He exhibits no edema or tenderness.   Lymphadenopathy:     He has no cervical adenopathy.   Neurological: He is alert and oriented to person, place, and time. No cranial nerve deficit. He exhibits normal muscle tone. Gait normal. Coordination normal. GCS score is 15.   Good grip strength bilaterally. 5 out of 5 muscle tone bilateral upper and lower extremities   Skin: Skin is warm and dry. No rash noted. He is not diaphoretic. No erythema. No pallor.   Psychiatric: Mood, memory, affect and judgment normal.   Nursing note and vitals reviewed.    Orders Placed     Orders Placed This Encounter   Procedures   . CBC   . CMP   . Dextrose Stick Glucose       Diagnostic Results       The results of the diagnostic studies below have been reviewed by myself:    Labs  Results     Procedure Component Value Units Date/Time    CMP [[id number]]  (Abnormal) Collected:  02/12/17 0052    Specimen:  Plasma Updated:  02/12/17 0134     Sodium 141 mMol/L      Potassium 4.1 mMol/L      Chloride 109 mMol/L      CO2 22.7 mMol/L      Calcium 9.3 mg/dL      Glucose 119 (H) mg/dL      Creatinine 1.47 mg/dL      BUN 22  mg/dL      Protein, Total 6.7 gm/dL      Albumin 3.3 (L) gm/dL      Alkaline Phosphatase 65 U/L  ALT 30 U/L      AST (SGOT) 21 U/L      Bilirubin, Total 0.4 mg/dL      Albumin/Globulin Ratio 0.97 Ratio      Anion Gap 13.4 mMol/L      BUN/Creatinine Ratio 17.5 Ratio      EGFR 53 (L) mL/min/1.77m2      Osmolality Calc 289 mOsm/kg      Globulin 3.4 gm/dL     CBC [[id number]]  (Abnormal) Collected:  02/12/17 0052    Specimen:  Blood from Blood Updated:  02/12/17 0116     WBC 7.8 K/cmm      RBC 3.86 (L) M/cmm      Hemoglobin 13.2 gm/dL      Hematocrit 40.9 (L) %      MCV 101 (H) fL      MCH 34 pg      MCHC 34 gm/dL      RDW 81.1 %      PLT CT 159 K/cmm      MPV 7.6 fL      NEUTROPHIL % 60.6 %      Lymphocytes 26.7 %      Monocytes 6.8 %      Eosinophils % 5.6 %      Basophils % 0.3 %      Neutrophils Absolute 4.7 K/cmm      Lymphocytes Absolute 2.1 K/cmm      Monocytes Absolute 0.5 K/cmm      Eosinophils Absolute 0.4 K/cmm      BASO Absolute 0.0 K/cmm     Dextrose Stick Glucose [[id number]]  (Abnormal) Collected:  02/12/17 0030    Specimen:  Blood Updated:  02/12/17 0047     Glucose, POCT 251 (H) mg/dL           Radiologic Studies  Radiology Results (24 Hour)     ** No results found for the last 24 hours. **          EKG: none      MDM / Critical Care     Blood pressure 137/79, pulse 72, temperature 97.5 F (36.4 C), temperature source Tympanic, resp. rate 17, weight 97 kg, SpO2 98 %.    Patient's presentation is concerning for acutehyperglycemia.. In light of the patient's presentation I do feel this is consistent with their diabetes, TYPE 2.  There is no strong evidence to support CVA. No strong evidence to support other infectious etiologies. The patient will be monitored here in the emergency department. He be given IV fluids.. Blood sugar will be reevaluated. The patient is not currently on any oral agents. The blood sugar has been relatively poorly controlled.    Patient did remain stable while here number  department. They're tolerating by mouth fluids and a meal. They were discharged home. They're told to follow-up with her primary care physician for possible adjustment of their blood sugar medications. . The patient/family voice understanding to the instructions. They're told return for new symptoms new concerns worsening symptoms or concerns. Follow blood sugars regularly as prescribed per the physician. We asked if there are any further concerns or cannot addressed and they stated no.      Procedures             Diagnosis / Disposition     Clinical Impression  1. Hyperglycemia    2. Medically noncompliant    3. Type 2 diabetes mellitus with complication, unspecified whether long term insulin use    4.  Neuropathy        Disposition  ED Disposition     ED Disposition Condition Date/Time Comment    Discharge  Sun Feb 12, 2017  2:26 AM Vania Rea discharge to home/self care.    Condition at disposition: Stable          Prescriptions  There are no discharge medications for this patient.                   Joesphine Bare, MD  02/21/17 [phone]

## 2017-03-02 ENCOUNTER — Ambulatory Visit: Payer: Self-pay | Admitting: Family Medicine

## 2017-03-06 ENCOUNTER — Ambulatory Visit: Payer: Self-pay | Admitting: Family Medicine

## 2017-03-15 ENCOUNTER — Ambulatory Visit (INDEPENDENT_AMBULATORY_CARE_PROVIDER_SITE_OTHER): Payer: Medicare Other | Admitting: Family Medicine

## 2017-03-15 ENCOUNTER — Encounter: Payer: Self-pay | Admitting: Family Medicine

## 2017-03-15 VITALS — BP 120/80 | HR 84 | Temp 97.4°F | Ht 70.0 in | Wt 220.2 lb

## 2017-03-15 DIAGNOSIS — Z91199 Patient's noncompliance with other medical treatment and regimen due to unspecified reason: Secondary | ICD-10-CM

## 2017-03-15 DIAGNOSIS — Z9119 Patient's noncompliance with other medical treatment and regimen: Secondary | ICD-10-CM | POA: Insufficient documentation

## 2017-03-15 DIAGNOSIS — N5237 Erectile dysfunction following prostate ablative therapy: Secondary | ICD-10-CM | POA: Diagnosis not present

## 2017-03-15 DIAGNOSIS — E7800 Pure hypercholesterolemia, unspecified: Secondary | ICD-10-CM | POA: Diagnosis not present

## 2017-03-15 DIAGNOSIS — I69311 Memory deficit following cerebral infarction: Secondary | ICD-10-CM | POA: Diagnosis not present

## 2017-03-15 DIAGNOSIS — N3945 Continuous leakage: Secondary | ICD-10-CM | POA: Insufficient documentation

## 2017-03-15 DIAGNOSIS — E118 Type 2 diabetes mellitus with unspecified complications: Secondary | ICD-10-CM | POA: Diagnosis not present

## 2017-03-15 DIAGNOSIS — Z8546 Personal history of malignant neoplasm of prostate: Secondary | ICD-10-CM | POA: Diagnosis not present

## 2017-03-15 DIAGNOSIS — Z8673 Personal history of transient ischemic attack (TIA), and cerebral infarction without residual deficits: Secondary | ICD-10-CM

## 2017-03-15 DIAGNOSIS — Z794 Long term (current) use of insulin: Secondary | ICD-10-CM

## 2017-03-15 NOTE — Progress Notes (Signed)
Subjective:  Patient ID: Jeremiah Mccoy, male    DOB: 21-Feb-1934  Age: 83 y.o. MRN: [id number]  CC: Establish Care   HPI Ledon Weihe presents for establishment of care.  I also saw his wife today for establishment of care.  Her appointment was just prior to his.  They recently moved down here from [place].  Patient sustained a stroke back in September of this year.  The stroke has left him with memory issues and the inability to process numbers.  He was started on Plavix and high-dose atorvastatin but has not been taking these because they make him feel bad he says.  He has a past medical history also of type 2 diabetes.  He says that the [place] gave him Novolin 70/30 and told him to take 15 units twice daily.  Once in the morning and again at night.  He was also given metformin to take as well.  He is not taking either 1 of these medicines.  He is not checking his blood sugars.  He denies blurred vision, weight loss, and increased urinary frequency.  He underwent a radical prostatectomy for cancer back around 2010.  That procedure has left him with PD and urinary incontinence.  The last PSA that he and his wife remember was around 3.  History L[place]lann has a past medical history of Cancer ([place]), Depression, Diabetes mellitus without complication ([place]), and Stroke ([place]).   He has a past surgical history that includes Cholecystectomy and Appendectomy.   His family history includes Cancer in his mother; Diabetes in his brother; Stroke in his father.He reports that  has never smoked. he has never used smokeless tobacco. He reports that he does not drink alcohol or use drugs.  Outpatient Medications Prior to Visit  Medication Sig Dispense Refill  . atorvastatin (LIPITOR) 40 MG tablet Take 40 mg by mouth daily.    . clopidogrel (PLAVIX) 75 MG tablet Take 75 mg by mouth daily.    . Insulin Isophane & Regular Human (HUMULIN 70/30 KWIKPEN) (70-30) 100 UNIT/ML PEN Inject into the skin.    Marland Kitchen  lisinopril (PRINIVIL,ZESTRIL) 2.5 MG tablet Take 2.5 mg by mouth daily.     No facility-administered medications prior to visit.     ROS Review of Systems  Constitutional: Negative for activity change, appetite change, chills, fatigue, fever and unexpected weight change.  HENT: Negative.   Eyes: Negative for photophobia and visual disturbance.  Respiratory: Negative for chest tightness and shortness of breath.   Cardiovascular: Negative for chest pain and palpitations.  Gastrointestinal: Negative for abdominal pain, nausea and vomiting.  Endocrine: Negative for polyphagia and polyuria.  Genitourinary: Negative for decreased urine volume, difficulty urinating and frequency.  Musculoskeletal: Negative for gait problem.  Allergic/Immunologic: Negative for immunocompromised state.  Neurological: Negative for dizziness, speech difficulty, weakness, light-headedness, numbness and headaches.  Hematological: Does not bruise/bleed easily.  Psychiatric/Behavioral: Negative for agitation, behavioral problems, dysphoric mood, self-injury and suicidal ideas.    Objective:  BP 120/80 (BP Location: Left Arm, Patient Position: Sitting, Cuff Size: Normal)   Pulse 84   Temp (!) 97.4 F (36.3 C) (Oral)   Ht 5\' 10"  (1.778 m)   Wt 220 lb 4 oz (99.9 kg)   SpO2 96%   BMI 31.60 kg/m   Physical Exam  Constitutional: He is oriented to person, place, and time. He appears well-developed and well-nourished. No distress.  HENT:  Head: Normocephalic and atraumatic.  Right Ear: External ear normal.  Left Ear: External ear  normal.  Mouth/Throat: Oropharynx is clear and moist. No oropharyngeal exudate.  Eyes: Conjunctivae are normal. Pupils are equal, round, and reactive to light. Right eye exhibits no discharge. Left eye exhibits no discharge. No scleral icterus.  Neck: Neck supple. No JVD present. No tracheal deviation present. No thyromegaly present.  Cardiovascular: Normal rate, regular rhythm and normal  heart sounds.  Pulmonary/Chest: Effort normal and breath sounds normal. No stridor.  Abdominal: Bowel sounds are normal.  Lymphadenopathy:    He has no cervical adenopathy.  Neurological: He is alert and oriented to person, place, and time.  Unable to perform serial sevens.  Skin: Skin is warm and dry. He is not diaphoretic.  Psychiatric: He has a normal mood and affect. His behavior is normal.      Assessment & Plan:   Erling was seen today for establish care.  Diagnoses and all orders for this visit:  Type 2 diabetes mellitus with complication, with long-term current use of insulin (HCC) -     CBC -     Comprehensive metabolic panel -     Urinalysis, Routine w reflex microscopic -     Microalbumin / creatinine urine ratio -     Ambulatory referral to Endocrinology -     Fructosamine  Elevated cholesterol -     Comprehensive metabolic panel -     Lipid panel  History of stroke  Non-compliance  History of prostate cancer -     PSA -     Ambulatory referral to Urology  Continuous leakage of urine -     Ambulatory referral to Urology  Erectile dysfunction following prostate ablative therapy -     Ambulatory referral to Urology   I am having [name] maintain his Insulin Isophane & Regular Human, clopidogrel, atorvastatin, and lisinopril.  No orders of the defined types were placed in this encounter.    Follow-up: No Follow-up on file.  Libby Maw, MD

## 2017-03-15 NOTE — Addendum Note (Signed)
Addended by: Lynnea Ferrier on: 03/15/2017 03:28 PM   Modules accepted: Orders

## 2017-03-15 NOTE — Addendum Note (Signed)
Addended by: Lynnea Ferrier on: 03/15/2017 04:19 PM   Modules accepted: Orders

## 2017-03-16 ENCOUNTER — Other Ambulatory Visit (INDEPENDENT_AMBULATORY_CARE_PROVIDER_SITE_OTHER): Payer: Medicare Other

## 2017-03-16 DIAGNOSIS — E7800 Pure hypercholesterolemia, unspecified: Secondary | ICD-10-CM

## 2017-03-16 DIAGNOSIS — N5237 Erectile dysfunction following prostate ablative therapy: Secondary | ICD-10-CM

## 2017-03-16 DIAGNOSIS — Z794 Long term (current) use of insulin: Secondary | ICD-10-CM

## 2017-03-16 DIAGNOSIS — Z8546 Personal history of malignant neoplasm of prostate: Secondary | ICD-10-CM | POA: Diagnosis not present

## 2017-03-16 DIAGNOSIS — E118 Type 2 diabetes mellitus with unspecified complications: Secondary | ICD-10-CM | POA: Diagnosis not present

## 2017-03-16 LAB — LIPID PANEL
CHOL/HDL RATIO: 5
Cholesterol: 195 mg/dL (ref 0–200)
HDL: 40.7 mg/dL (ref >39.00)
LDL Cholesterol: 134 mg/dL — ABNORMAL HIGH (ref 0–99)
NONHDL: 154.17
Triglycerides: 102 mg/dL (ref 0.0–149.0)
VLDL: 20.4 mg/dL (ref 0.0–40.0)

## 2017-03-16 LAB — URINALYSIS, ROUTINE W REFLEX MICROSCOPIC
Bilirubin Urine: NEGATIVE
HGB URINE DIPSTICK: NEGATIVE
Ketones, ur: NEGATIVE
LEUKOCYTES UA: NEGATIVE
NITRITE: NEGATIVE
RBC / HPF: NONE SEEN (ref 0–?)
Specific Gravity, Urine: 1.02 (ref 1.000–1.030)
TOTAL PROTEIN, URINE-UPE24: NEGATIVE
URINE GLUCOSE: NEGATIVE
Urobilinogen, UA: 0.2 (ref 0.0–1.0)
pH: 5.5 (ref 5.0–8.0)

## 2017-03-16 LAB — COMPREHENSIVE METABOLIC PANEL
ALT: 25 U/L (ref 0–53)
AST: 19 U/L (ref 0–37)
Albumin: 3.7 g/dL (ref 3.5–5.2)
Alkaline Phosphatase: 61 U/L (ref 39–117)
BUN: 18 mg/dL (ref 6–23)
CO2: 25 meq/L (ref 19–32)
Calcium: 8.7 mg/dL (ref 8.4–10.5)
Chloride: 107 mEq/L (ref 96–112)
Creatinine, Ser: 1.04 mg/dL (ref 0.40–1.50)
GFR: 72.7 mL/min (ref >60.00)
GLUCOSE: 154 mg/dL — AB (ref 70–99)
POTASSIUM: 4.8 meq/L (ref 3.5–5.1)
SODIUM: 137 meq/L (ref 135–145)
Total Bilirubin: 0.5 mg/dL (ref 0.2–1.2)
Total Protein: 6.6 g/dL (ref 6.0–8.3)

## 2017-03-16 LAB — PSA: PSA: 0.46 ng/mL (ref 0.10–4.00)

## 2017-03-16 LAB — MICROALBUMIN / CREATININE URINE RATIO
Creatinine,U: 86.4 mg/dL
MICROALB UR: 7.7 mg/dL — AB (ref 0.0–1.9)
MICROALB/CREAT RATIO: 8.9 mg/g (ref 0.0–30.0)

## 2017-03-16 NOTE — Addendum Note (Signed)
Addended by: Lynnea Ferrier on: 03/16/2017 01:35 PM   Modules accepted: Orders

## 2017-03-17 NOTE — Addendum Note (Signed)
Addended by: Lynnea Ferrier on: 03/17/2017 10:50 AM   Modules accepted: Orders

## 2017-03-20 LAB — FRUCTOSAMINE: Fructosamine: 274 umol/L — ABNORMAL HIGH (ref 190–270)

## 2017-03-22 ENCOUNTER — Encounter: Payer: Self-pay | Admitting: Family Medicine

## 2017-03-22 ENCOUNTER — Ambulatory Visit (INDEPENDENT_AMBULATORY_CARE_PROVIDER_SITE_OTHER): Payer: Medicare Other | Admitting: Family Medicine

## 2017-03-22 VITALS — BP 136/76 | HR 70 | Ht 70.0 in | Wt 220.4 lb

## 2017-03-22 DIAGNOSIS — Z794 Long term (current) use of insulin: Secondary | ICD-10-CM | POA: Diagnosis not present

## 2017-03-22 DIAGNOSIS — E118 Type 2 diabetes mellitus with unspecified complications: Secondary | ICD-10-CM | POA: Diagnosis not present

## 2017-03-22 LAB — CBC
HCT: 43.3 % (ref 39.0–52.0)
HEMOGLOBIN: 14.2 g/dL (ref 13.0–17.0)
MCHC: 32.9 g/dL (ref 30.0–36.0)
MCV: 102.5 fl — ABNORMAL HIGH (ref 78.0–100.0)
PLATELETS: 163 10*3/uL (ref 150.0–400.0)
RBC: 4.22 Mil/uL (ref 4.22–5.81)
RDW: 14.3 % (ref 11.5–15.5)
WBC: 7.3 10*3/uL (ref 4.0–10.5)

## 2017-03-22 LAB — HEMOGLOBIN A1C: HEMOGLOBIN A1C: 7.6 % — AB (ref 4.6–6.5)

## 2017-03-22 MED ORDER — METFORMIN HCL 500 MG PO TABS: 500.0000 mg | ORAL_TABLET | Freq: Two times a day (BID) | ORAL | 2 refills | Status: DC

## 2017-03-22 NOTE — Progress Notes (Addendum)
Subjective:  Patient ID: Jeremiah Mccoy, male    DOB: 04-13-1933  Age: 83 y.o. MRN: [id number]  CC: Follow-up   HPI Jeremiah Mccoy presents for follow-up of his diabetes.  We were unable to obtain a hemoglobin A1c at his last visit.  His glucose being was almost in normal range.  He has not been taking insulin for some time now.  His wife is trying to help him with his Glucophage but she herself does not always remember to remind him to take it.  They have been through diabetic counseling and he refuses to follow the recommendations.  Patient's wife does not believe that further counseling will be of benefit.  They have not heard about a endocrinology consul. as of yet.  Outpatient Medications Prior to Visit  Medication Sig Dispense Refill  . atorvastatin (LIPITOR) 40 MG tablet Take 40 mg by mouth daily.    . clopidogrel (PLAVIX) 75 MG tablet Take 75 mg by mouth daily.    . Insulin Isophane & Regular Human (HUMULIN 70/30 KWIKPEN) (70-30) 100 UNIT/ML PEN Inject into the skin.    Marland Kitchen lisinopril (PRINIVIL,ZESTRIL) 2.5 MG tablet Take 2.5 mg by mouth daily.     No facility-administered medications prior to visit.     ROS Review of Systems  Constitutional: Negative.  Negative for chills, fatigue, fever and unexpected weight change.  Endocrine: Negative for polyphagia and polyuria.  Genitourinary: Negative for frequency.  Skin: Negative for pallor and rash.  Neurological: Negative for speech difficulty, weakness and light-headedness.  Psychiatric/Behavioral: Positive for confusion and decreased concentration.    Objective:  BP 136/76 (BP Location: Left Arm, Patient Position: Sitting, Cuff Size: Normal)   Pulse 70   Ht 5\' 10"  (1.778 m)   Wt 220 lb 6 oz (100 kg)   SpO2 90%   BMI 31.62 kg/m   BP Readings from Last 3 Encounters:  03/22/17 136/76  03/15/17 120/80    Wt Readings from Last 3 Encounters:  03/22/17 220 lb 6 oz (100 kg)  03/15/17 220 lb 4 oz (99.9 kg)    Physical Exam   Constitutional: He is oriented to person, place, and time. He appears well-developed and well-nourished. No distress.  HENT:  Head: Normocephalic and atraumatic.  Right Ear: External ear normal.  Left Ear: External ear normal.  Pulmonary/Chest: Effort normal.  Neurological: He is alert and oriented to person, place, and time.  Skin: He is not diaphoretic.  Psychiatric: He has a normal mood and affect. His behavior is normal.    Neuro: He was able to tell me the time.  He was unable to draw a clock face with that time on it.  1 out of 3 words remembered for immediate recall. Lab Results  Component Value Date   WBC 7.3 03/22/2017   HGB 14.2 03/22/2017   HCT 43.3 03/22/2017   PLT 163.0 03/22/2017   GLUCOSE 154 (H) 03/16/2017   CHOL 195 03/16/2017   TRIG 102.0 03/16/2017   HDL 40.70 03/16/2017   LDLCALC 134 (H) 03/16/2017   ALT 25 03/16/2017   AST 19 03/16/2017   NA 137 03/16/2017   K 4.8 03/16/2017   CL 107 03/16/2017   CREATININE 1.04 03/16/2017   BUN 18 03/16/2017   CO2 25 03/16/2017   PSA 0.46 03/16/2017   HGBA1C 7.6 (H) 03/22/2017   MICROALBUR 7.7 (H) 03/16/2017    Patient was never admitted.  Assessment & Plan:   Jeremiah Mccoy was seen today for follow-up.  Diagnoses and  all orders for this visit:  Type 2 diabetes mellitus with complication, with long-term current use of insulin (HCC) -     Hemoglobin A1c -     CBC -     metFORMIN (GLUCOPHAGE) 500 MG tablet; Take 1 tablet (500 mg total) by mouth 2 (two) times daily with a meal.   I am having Jeremiah Mccoy start on metFORMIN. I am also having him maintain his Insulin Isophane & Regular Human, clopidogrel, atorvastatin, and lisinopril.  Meds ordered this encounter  Medications  . metFORMIN (GLUCOPHAGE) 500 MG tablet    Sig: Take 1 tablet (500 mg total) by mouth 2 (two) times daily with a meal.    Dispense:  60 tablet    Refill:  2   At this time for now I have asked his wife to hold his insulin but to be sure to  give him the Glucophage nightly if possible we will try to obtain a hemoglobin A1c today.  He may not need an endocrinology consult.  If he does require insulin we may need visiting home health nurse to help.  Follow-up in 1 month.  Follow-up: Return in about 1 month (around 04/22/2017).  Libby Maw, MD

## 2017-03-22 NOTE — Addendum Note (Signed)
Addended by: Jon Billings on: 03/22/2017 05:02 PM   Modules accepted: Orders

## 2017-04-17 ENCOUNTER — Ambulatory Visit (INDEPENDENT_AMBULATORY_CARE_PROVIDER_SITE_OTHER): Payer: Medicare Other | Admitting: Family Medicine

## 2017-04-17 ENCOUNTER — Encounter: Payer: Self-pay | Admitting: Family Medicine

## 2017-04-17 VITALS — BP 124/70 | HR 106 | Ht 70.0 in | Wt 220.2 lb

## 2017-04-17 DIAGNOSIS — E118 Type 2 diabetes mellitus with unspecified complications: Secondary | ICD-10-CM | POA: Diagnosis not present

## 2017-04-17 DIAGNOSIS — E7800 Pure hypercholesterolemia, unspecified: Secondary | ICD-10-CM

## 2017-04-17 DIAGNOSIS — Z794 Long term (current) use of insulin: Secondary | ICD-10-CM | POA: Diagnosis not present

## 2017-04-17 DIAGNOSIS — Z8546 Personal history of malignant neoplasm of prostate: Secondary | ICD-10-CM

## 2017-04-17 DIAGNOSIS — R972 Elevated prostate specific antigen [PSA]: Secondary | ICD-10-CM | POA: Diagnosis not present

## 2017-04-17 DIAGNOSIS — Z9119 Patient's noncompliance with other medical treatment and regimen: Secondary | ICD-10-CM | POA: Diagnosis not present

## 2017-04-17 DIAGNOSIS — Z8673 Personal history of transient ischemic attack (TIA), and cerebral infarction without residual deficits: Secondary | ICD-10-CM | POA: Diagnosis not present

## 2017-04-17 DIAGNOSIS — Z91199 Patient's noncompliance with other medical treatment and regimen due to unspecified reason: Secondary | ICD-10-CM

## 2017-04-17 NOTE — Addendum Note (Signed)
Addended by: Jon Billings on: 04/17/2017 03:16 PM   Modules accepted: Orders

## 2017-04-17 NOTE — Progress Notes (Addendum)
Subjective:  Patient ID: Jeremiah Mccoy, male    DOB: 07-01-1933  Age: 84 y.o. MRN: [id number]  CC: Follow-up   HPI Jeremiah Mccoy presents for follow-up below listed problems.  He continues to be noncompliant with his medicines.  He does not want to see the urologist.  He remains incontinent and impotent.  He continues to cause his wife a lot of frustration.  Outpatient Medications Prior to Visit  Medication Sig Dispense Refill  . atorvastatin (LIPITOR) 40 MG tablet Take 40 mg by mouth daily.    . clopidogrel (PLAVIX) 75 MG tablet Take 75 mg by mouth daily.    Marland Kitchen lisinopril (PRINIVIL,ZESTRIL) 2.5 MG tablet Take 2.5 mg by mouth daily.    . metFORMIN (GLUCOPHAGE) 500 MG tablet Take 1 tablet (500 mg total) by mouth 2 (two) times daily with a meal. 60 tablet 2  . Insulin Isophane & Regular Human (HUMULIN 70/30 KWIKPEN) (70-30) 100 UNIT/ML PEN Inject into the skin.     No facility-administered medications prior to visit.     ROS Review of Systems  Constitutional: Negative.   HENT: Negative.   Eyes: Negative.   Respiratory: Negative.   Cardiovascular: Negative.   Gastrointestinal: Negative.   Endocrine: Negative for polyphagia and polyuria.  Musculoskeletal: Negative.   Skin: Negative.   Neurological: Negative for speech difficulty, weakness, numbness and headaches.  Psychiatric/Behavioral: Positive for confusion and decreased concentration. Negative for behavioral problems, dysphoric mood, hallucinations, self-injury and sleep disturbance. The patient is not nervous/anxious and is not hyperactive.     Objective:  BP 124/70 (BP Location: Left Arm, Patient Position: Sitting, Cuff Size: Normal)   Pulse (!) 106   Ht 5\' 10"  (1.778 m)   Wt 220 lb 4 oz (99.9 kg)   SpO2 95%   BMI 31.60 kg/m   BP Readings from Last 3 Encounters:  04/17/17 124/70  03/22/17 136/76  03/15/17 120/80    Wt Readings from Last 3 Encounters:  04/17/17 220 lb 4 oz (99.9 kg)  03/22/17 220 lb 6 oz (100  kg)  03/15/17 220 lb 4 oz (99.9 kg)    Physical Exam  Constitutional: He is oriented to person, place, and time. He appears well-developed and well-nourished.  HENT:  Head: Normocephalic and atraumatic.  Right Ear: External ear normal.  Left Ear: External ear normal.  Eyes: Conjunctivae are normal. Right eye exhibits no discharge. Left eye exhibits no discharge. No scleral icterus.  Neck: No JVD present. No tracheal deviation present.  Pulmonary/Chest: Effort normal. No stridor.  Neurological: He is alert and oriented to person, place, and time.  Skin: Skin is warm and dry.  Psychiatric: He has a normal mood and affect. His behavior is normal.    Lab Results  Component Value Date   WBC 7.3 03/22/2017   HGB 14.2 03/22/2017   HCT 43.3 03/22/2017   PLT 163.0 03/22/2017   GLUCOSE 154 (H) 03/16/2017   CHOL 195 03/16/2017   TRIG 102.0 03/16/2017   HDL 40.70 03/16/2017   LDLCALC 134 (H) 03/16/2017   ALT 25 03/16/2017   AST 19 03/16/2017   NA 137 03/16/2017   K 4.8 03/16/2017   CL 107 03/16/2017   CREATININE 1.04 03/16/2017   BUN 18 03/16/2017   CO2 25 03/16/2017   PSA 0.46 03/16/2017   HGBA1C 7.6 (H) 03/22/2017   MICROALBUR 7.7 (H) 03/16/2017    Patient was never admitted.  Assessment & Plan:   Jeremiah Mccoy was seen today for follow-up.  Diagnoses and  all orders for this visit:  Type 2 diabetes mellitus with complication, with long-term current use of insulin (Jeremiah Mccoy) -     Ambulatory referral to Podiatry  Elevated cholesterol  History of stroke  Non-compliance  History of prostate cancer  Elevated PSA, less than 10 ng/ml   I have discontinued Jaycee Dirk's Insulin Isophane & Regular Human. I am also having him maintain his clopidogrel, atorvastatin, lisinopril, and metFORMIN.  No orders of the defined types were placed in this encounter.  Patient does not wish to see the urologist at this time.  We will bring him back in 3 months and recheck his PSA.  I  strongly urged him to take his medicines as directed.  I said that his current medical therapy could go a long ways towards preventing that next stroke and prolonged quality of his life.  I reminded him that if these were to sustain another stroke his wife probably would not be able to care for him at home any longer.  Spent 20 minutes with this patient with more than 50% of the time spent in counseling.  Follow-up: Return in about 3 months (around 07/16/2017).  Libby Maw, MD

## 2017-04-20 ENCOUNTER — Telehealth: Payer: Self-pay | Admitting: Family Medicine

## 2017-04-20 NOTE — Telephone Encounter (Signed)
Spoke with Mr. Mcisaac wife [name]. She stated that her husband will scheduled his wellness appointment when he comes to his visit with Dr. Ethelene Hal on 07/17/17. SF

## 2017-04-25 ENCOUNTER — Telehealth: Payer: Self-pay

## 2017-04-25 NOTE — Telephone Encounter (Signed)
It looks like you tested his PSA back in December.   Copied from [place] [id number]. Topic: General - Other >> Apr 25, 2017 10:33 AM Valla Leaver wrote: Reason for CRM: Wife wants to know when Jeremiah Mccoy will need PSA test?

## 2017-04-26 NOTE — Telephone Encounter (Signed)
He should be scheduled to see me in April.

## 2017-04-26 NOTE — Telephone Encounter (Signed)
Patient's wife is aware that patient is scheduled to be seen in April & that a PSA was done in December.

## 2017-05-18 ENCOUNTER — Telehealth: Payer: Self-pay | Admitting: Family Medicine

## 2017-05-18 MED ORDER — CLOPIDOGREL BISULFATE 75 MG PO TABS: 75.0000 mg | ORAL_TABLET | Freq: Every day | ORAL | 1 refills | Status: DC

## 2017-05-18 NOTE — Telephone Encounter (Signed)
Okay to refill? 

## 2017-05-18 NOTE — Telephone Encounter (Signed)
Rx sent in

## 2017-05-18 NOTE — Telephone Encounter (Signed)
Refill  Request  Plavix  Last filled  - Historical  Provider  LOV  04-17-2017  Pharmacy  [facility]  [address]

## 2017-05-18 NOTE — Telephone Encounter (Signed)
Yes

## 2017-05-18 NOTE — Telephone Encounter (Signed)
Copied from [place]. Topic: Quick Communication - Rx Refill/Question >> May 18, 2017 10:58 AM Clack, Janett Billow D wrote: Medication:  clopidogrel (PLAVIX) 75 MG tablet [[id number]]    Has the patient contacted their pharmacy? Yes.     (Agent: If no, request that the patient contact the pharmacy for the refill.)   Preferred Pharmacy (with phone number or street name): [facility] - [place] AT [facility] [phone] (Phone) [phone] (Fax)  Pt wife [name] states Dr. Ethelene Hal know that he did not order this medication for the pt. But he would call in refills for him.    Agent: Please be advised that RX refills may take up to 3 business days. We ask that you follow-up with your pharmacy.

## 2017-05-19 DIAGNOSIS — H2513 Age-related nuclear cataract, bilateral: Secondary | ICD-10-CM | POA: Diagnosis not present

## 2017-05-19 DIAGNOSIS — H524 Presbyopia: Secondary | ICD-10-CM | POA: Diagnosis not present

## 2017-05-19 DIAGNOSIS — E113293 Type 2 diabetes mellitus with mild nonproliferative diabetic retinopathy without macular edema, bilateral: Secondary | ICD-10-CM | POA: Diagnosis not present

## 2017-05-30 ENCOUNTER — Encounter: Payer: Self-pay | Admitting: Podiatry

## 2017-05-30 ENCOUNTER — Encounter: Payer: Medicare Other | Admitting: Podiatry

## 2017-05-30 NOTE — Progress Notes (Signed)
   Subjective:    Patient ID: Jeremiah Mccoy, male    DOB: 04-02-1933, 84 y.o.   MRN: [id number]  HPI    Review of Systems  All other systems reviewed and are negative.      Objective:   Physical Exam        Assessment & Plan:

## 2017-05-30 NOTE — Progress Notes (Signed)
   Subjective:    Patient ID: Jeremiah Mccoy, male    DOB: [birth year], 84 y.o.   MRN: [id number]  HPI    Review of Systems  All other systems reviewed and are negative.      Objective:   Physical Exam        Assessment & Plan:

## 2017-05-31 NOTE — Progress Notes (Signed)
This encounter was created in error - please disregard.

## 2017-06-22 ENCOUNTER — Encounter: Payer: Self-pay | Admitting: Family Medicine

## 2017-06-22 ENCOUNTER — Ambulatory Visit (INDEPENDENT_AMBULATORY_CARE_PROVIDER_SITE_OTHER): Payer: Medicare Other | Admitting: Family Medicine

## 2017-06-22 VITALS — BP 136/70 | HR 106 | Ht 70.0 in | Wt 217.1 lb

## 2017-06-22 DIAGNOSIS — K219 Gastro-esophageal reflux disease without esophagitis: Secondary | ICD-10-CM | POA: Diagnosis not present

## 2017-06-22 DIAGNOSIS — R079 Chest pain, unspecified: Secondary | ICD-10-CM

## 2017-06-22 MED ORDER — RANITIDINE HCL 300 MG PO TABS: 300.0000 mg | ORAL_TABLET | Freq: Every day | ORAL | 1 refills | Status: DC

## 2017-06-22 NOTE — Progress Notes (Signed)
Subjective:  Patient ID: Jeremiah Mccoy, male    DOB: 09/04/1933  Age: 84 y.o. MRN: [id number]  CC: Gastroesophageal Reflux   HPI Jeremiah Mccoy presents for evaluation of an acute episode of severe in the indigestion that he had experienced last night.  It was finally relieved after taking 5 pounds.  He felt some slight nausea during this.  There was no shortness of breath vomiting or diaphoresis.  Patient has never smoked or drink alcohol.  He has no history of acid reflex reflux or peptic ulcer disease.  No recent weight gain.  He has not always compliant with taking his medications.  He is accompanied by his wife today.  He did not wake her up last night  When this occurred.   Outpatient Medications Prior to Visit  Medication Sig Dispense Refill  . atorvastatin (LIPITOR) 40 MG tablet Take 40 mg by mouth daily.    . clopidogrel (PLAVIX) 75 MG tablet Take 1 tablet (75 mg total) by mouth daily. 90 tablet 1  . lisinopril (PRINIVIL,ZESTRIL) 2.5 MG tablet Take 2.5 mg by mouth daily.    . metFORMIN (GLUCOPHAGE) 500 MG tablet Take 1 tablet (500 mg total) by mouth 2 (two) times daily with a meal. 60 tablet 2   No facility-administered medications prior to visit.     ROS Review of Systems  Constitutional: Negative.   HENT: Negative.   Eyes: Negative.   Respiratory: Negative for chest tightness, shortness of breath and wheezing.   Cardiovascular: Positive for chest pain. Negative for palpitations.  Gastrointestinal: Positive for nausea. Negative for abdominal pain and vomiting.  Musculoskeletal: Negative.  Negative for neck pain.  Allergic/Immunologic: Negative for immunocompromised state.  Psychiatric/Behavioral: Negative.     Objective:  BP 136/70 (BP Location: Left Arm, Patient Position: Sitting, Cuff Size: Normal)   Pulse (!) 106   Ht 5\' 10"  (1.778 m)   Wt 217 lb 2 oz (98.5 kg)   SpO2 97%   BMI 31.15 kg/m   BP Readings from Last 3 Encounters:  06/22/17 136/70  05/30/17 (!)  152/85  04/17/17 124/70    Wt Readings from Last 3 Encounters:  06/22/17 217 lb 2 oz (98.5 kg)  04/17/17 220 lb 4 oz (99.9 kg)  03/22/17 220 lb 6 oz (100 kg)    Physical Exam  Constitutional: He appears well-developed and well-nourished. No distress.  HENT:  Head: Normocephalic and atraumatic.  Right Ear: External ear normal.  Left Ear: External ear normal.  Mouth/Throat: Oropharynx is clear and moist. No oropharyngeal exudate.  Eyes: Pupils are equal, round, and reactive to light. Conjunctivae are normal. Right eye exhibits no discharge. Left eye exhibits no discharge. No scleral icterus.  Neck: Neck supple. No JVD present. No tracheal deviation present. No thyromegaly present.  Cardiovascular: Normal rate, regular rhythm and normal heart sounds.  Pulmonary/Chest: Effort normal and breath sounds normal. No stridor.  Abdominal: Soft. Bowel sounds are normal.  Lymphadenopathy:    He has no cervical adenopathy.  Skin: He is not diaphoretic.    Lab Results  Component Value Date   WBC 7.3 03/22/2017   HGB 14.2 03/22/2017   HCT 43.3 03/22/2017   PLT 163.0 03/22/2017   GLUCOSE 154 (H) 03/16/2017   CHOL 195 03/16/2017   TRIG 102.0 03/16/2017   HDL 40.70 03/16/2017   LDLCALC 134 (H) 03/16/2017   ALT 25 03/16/2017   AST 19 03/16/2017   NA 137 03/16/2017   K 4.8 03/16/2017   CL 107 03/16/2017  CREATININE 1.04 03/16/2017   BUN 18 03/16/2017   CO2 25 03/16/2017   PSA 0.46 03/16/2017   HGBA1C 7.6 (H) 03/22/2017   MICROALBUR 7.7 (H) 03/16/2017    Patient was never admitted.  Assessment & Plan:   Kaizer was seen today for gastroesophageal reflux.  Diagnoses and all orders for this visit:  Chest pain, unspecified type -     EKG 12-Lead -     Ambulatory referral to Cardiology  Gastroesophageal reflux disease, esophagitis presence not specified -     ranitidine (ZANTAC) 300 MG tablet; Take 1 tablet (300 mg total) by mouth at bedtime.   I am having [name]  start on ranitidine. I am also having him maintain his atorvastatin, lisinopril, metFORMIN, and clopidogrel.  His EKG showed regular rate and rhythm without ST segment elevation or inverted P waves.  Stressed the importance of compliance with his Plavix and atorvastatin.  Will add a trial of Zantac high dose.  With further episodes of chest pain or GERD advised him to call 911.  Meds ordered this encounter  Medications  . ranitidine (ZANTAC) 300 MG tablet    Sig: Take 1 tablet (300 mg total) by mouth at bedtime.    Dispense:  30 tablet    Refill:  1     Follow-up: Return in about 1 week (around 06/29/2017), or if symptoms worsen or fail to improve.  Libby Maw, MD

## 2017-06-22 NOTE — Patient Instructions (Signed)

## 2017-06-28 ENCOUNTER — Encounter: Payer: Self-pay | Admitting: Family Medicine

## 2017-06-28 ENCOUNTER — Ambulatory Visit (INDEPENDENT_AMBULATORY_CARE_PROVIDER_SITE_OTHER): Payer: Medicare Other | Admitting: Family Medicine

## 2017-06-28 DIAGNOSIS — K219 Gastro-esophageal reflux disease without esophagitis: Secondary | ICD-10-CM | POA: Diagnosis not present

## 2017-06-28 NOTE — Progress Notes (Signed)
Subjective:  Patient ID: Jeremiah Mccoy, male    DOB: 1933-09-19  Age: 84 y.o. MRN: [id number]  CC: Follow-up   HPI Jeremiah Mccoy presents for follow-up of his indigestion.  He does not seem to think that the Zantac is helping much but he is only taking it  Intermittently much like he does for his other medicines.  He had another episode of indigestion and EMS did come to the house and obtained what looks like a normal EKG. vital signs were stable and his blood sugar was 122.  Outpatient Medications Prior to Visit  Medication Sig Dispense Refill  . atorvastatin (LIPITOR) 40 MG tablet Take 40 mg by mouth daily.    . clopidogrel (PLAVIX) 75 MG tablet Take 1 tablet (75 mg total) by mouth daily. 90 tablet 1  . lisinopril (PRINIVIL,ZESTRIL) 2.5 MG tablet Take 2.5 mg by mouth daily.    . metFORMIN (GLUCOPHAGE) 500 MG tablet Take 1 tablet (500 mg total) by mouth 2 (two) times daily with a meal. 60 tablet 2  . ranitidine (ZANTAC) 300 MG tablet Take 1 tablet (300 mg total) by mouth at bedtime. 30 tablet 1   No facility-administered medications prior to visit.     ROS Review of Systems  Constitutional: Negative.   HENT: Negative.   Eyes: Negative.   Respiratory: Negative.   Cardiovascular: Negative for chest pain.  Gastrointestinal: Negative.     Objective:  BP 130/80 (BP Location: Left Arm, Patient Position: Sitting, Cuff Size: Normal)   Pulse 81   Ht 5\' 10"  (1.778 m)   Wt 217 lb (98.4 kg)   SpO2 98%   BMI 31.14 kg/m   BP Readings from Last 3 Encounters:  06/28/17 130/80  06/22/17 136/70  05/30/17 (!) 152/85    Wt Readings from Last 3 Encounters:  06/28/17 217 lb (98.4 kg)  06/22/17 217 lb 2 oz (98.5 kg)  04/17/17 220 lb 4 oz (99.9 kg)    Physical Exam  Constitutional: He is oriented to person, place, and time. He appears well-developed and well-nourished. No distress.  HENT:  Head: Normocephalic and atraumatic.  Right Ear: External ear normal.  Left Ear: External  ear normal.  Eyes: Right eye exhibits no discharge. Left eye exhibits no discharge. No scleral icterus.  Neck: No JVD present. No tracheal deviation present.  Pulmonary/Chest: Effort normal. No stridor.  Neurological: He is alert and oriented to person, place, and time.  Skin: Skin is warm and dry. He is not diaphoretic.  Psychiatric: He has a normal mood and affect. His behavior is normal.    Lab Results  Component Value Date   WBC 7.3 03/22/2017   HGB 14.2 03/22/2017   HCT 43.3 03/22/2017   PLT 163.0 03/22/2017   GLUCOSE 154 (H) 03/16/2017   CHOL 195 03/16/2017   TRIG 102.0 03/16/2017   HDL 40.70 03/16/2017   LDLCALC 134 (H) 03/16/2017   ALT 25 03/16/2017   AST 19 03/16/2017   NA 137 03/16/2017   K 4.8 03/16/2017   CL 107 03/16/2017   CREATININE 1.04 03/16/2017   BUN 18 03/16/2017   CO2 25 03/16/2017   PSA 0.46 03/16/2017   HGBA1C 7.6 (H) 03/22/2017   MICROALBUR 7.7 (H) 03/16/2017    Patient was never admitted.  Assessment & Plan:   Jeremiah Mccoy was seen today for follow-up.  Diagnoses and all orders for this visit:  Gastroesophageal reflux disease, esophagitis presence not specified   I am having Jeremiah Mccoy maintain his atorvastatin,  lisinopril, metFORMIN, clopidogrel, and ranitidine.  No orders of the defined types were placed in this encounter.   patient will return on the 14th.  Stressed the importance of taking the Zantac daily.  Rather or not he works for him is much needed information.  Stressed the importance of taking the atorvastatin and the Plavix daily in case this may be his presentation of angina.  Follow-up: Return in about 2 weeks (around 07/12/2017).  Jeremiah Maw, MD

## 2017-06-29 ENCOUNTER — Ambulatory Visit: Payer: Medicare Other | Admitting: Family Medicine

## 2017-06-30 ENCOUNTER — Telehealth: Payer: Self-pay | Admitting: Family Medicine

## 2017-06-30 NOTE — Telephone Encounter (Signed)
Spoke with patient wife concerning a bill Mr. Leitzel received. Dawn with [employer] coding stated everything looks correct our office/billing have 2 insurances filed for patient and the bill was his copay.

## 2017-07-13 ENCOUNTER — Encounter: Payer: Self-pay | Admitting: Podiatry

## 2017-07-13 ENCOUNTER — Ambulatory Visit (INDEPENDENT_AMBULATORY_CARE_PROVIDER_SITE_OTHER): Payer: Medicare Other | Admitting: Podiatry

## 2017-07-13 DIAGNOSIS — B351 Tinea unguium: Secondary | ICD-10-CM | POA: Diagnosis not present

## 2017-07-13 DIAGNOSIS — E1151 Type 2 diabetes mellitus with diabetic peripheral angiopathy without gangrene: Secondary | ICD-10-CM

## 2017-07-13 NOTE — Progress Notes (Signed)
  Subjective:  Patient ID: Jeremiah Mccoy, male    DOB: 1934/03/18,  MRN: [id number]  Chief Complaint  Patient presents with  . Nail Problem    left second toenail, ripped off yesterday   84 y.o. male returns for diabetic foot care. Last AMBS was unknown. Reports he hit his L 2nd toe yesterday and the nail started to rip off. Denies numbness and tingling in their feet. Denies cramping in legs and thighs.  Objective:  There were no vitals filed for this visit. General AA&O x3. Normal mood and affect.  Vascular Dorsalis pedis pulses present 1+ bilaterally  Posterior tibial pulses absent bilaterally  Capillary refill normal to all digits. Pedal hair growth diminished.  Neurologic Epicritic sensation present bilaterally. Protective sensation present bilat.  Dermatologic No open lesions. Interspaces clear of maceration.  Normal skin temperature and turgor. Hyperkeratotic lesions: none bilaterally. Nails: L 2nd toenail with lysis, partial avulsion, scant bleeding. No signs of infection.. Remainder of nails dystrophic  Orthopedic: No history of amputation. MMT 5/5 in dorsiflexion, plantarflexion, inversion, and eversion. Normal lower extremity joint ROM without pain or crepitus.   Assessment & Plan:  Patient was evaluated and treated and all questions answered.  Diabetes with PAD, Onychomycosis; Traumatic Lysis of toenail L 2nd toe -Educated on diabetic footcare. Diabetic risk level 1 -At risk foot care provided as below. -Abx ointment and band-aid applied to L 2nd toe. Educated on continued soaking. Watch for signs of ingrown nail.  Procedure: Nail Debridement Rationale: Patient meets criteria for routine foot care due to 1 Class B, 2 Class C findings. Type of Debridement: manual, sharp debridement. Instrumentation: Nail nipper, rotary burr. Number of Nails: 10  Return in about 3 months (around 10/12/2017) for Diabetic Foot Care.

## 2017-07-17 ENCOUNTER — Ambulatory Visit (INDEPENDENT_AMBULATORY_CARE_PROVIDER_SITE_OTHER): Payer: Medicare Other | Admitting: Family Medicine

## 2017-07-17 ENCOUNTER — Encounter: Payer: Self-pay | Admitting: Family Medicine

## 2017-07-17 VITALS — BP 128/80 | HR 103 | Ht 70.0 in | Wt 210.1 lb

## 2017-07-17 DIAGNOSIS — Z794 Long term (current) use of insulin: Secondary | ICD-10-CM | POA: Diagnosis not present

## 2017-07-17 DIAGNOSIS — R972 Elevated prostate specific antigen [PSA]: Secondary | ICD-10-CM | POA: Diagnosis not present

## 2017-07-17 DIAGNOSIS — Z8546 Personal history of malignant neoplasm of prostate: Secondary | ICD-10-CM

## 2017-07-17 DIAGNOSIS — E118 Type 2 diabetes mellitus with unspecified complications: Secondary | ICD-10-CM

## 2017-07-17 DIAGNOSIS — D7589 Other specified diseases of blood and blood-forming organs: Secondary | ICD-10-CM | POA: Diagnosis not present

## 2017-07-17 NOTE — Progress Notes (Signed)
Subjective:  Patient ID: Jeremiah Mccoy, male    DOB: 16-Oct-1933  Age: 84 y.o. MRN: [id number]  CC: Follow-up   HPI Jeremiah Mccoy presents for follow-up of his diabetes and measurable PSA status post prostatectomy for prostate cancer.  He has declined urology referral in the past.  Consult with cardiology for possible stress test is tomorrow.  As far as I can tell his compliance with his medications is still plus or minus.  Outpatient Medications Prior to Visit  Medication Sig Dispense Refill  . atorvastatin (LIPITOR) 40 MG tablet Take 40 mg by mouth daily.    . clopidogrel (PLAVIX) 75 MG tablet Take 1 tablet (75 mg total) by mouth daily. 90 tablet 1  . lisinopril (PRINIVIL,ZESTRIL) 2.5 MG tablet Take 2.5 mg by mouth daily.    . metFORMIN (GLUCOPHAGE) 500 MG tablet Take 1 tablet (500 mg total) by mouth 2 (two) times daily with a meal. 60 tablet 2  . ranitidine (ZANTAC) 300 MG tablet Take 1 tablet (300 mg total) by mouth at bedtime. 30 tablet 1   No facility-administered medications prior to visit.     ROS Review of Systems  Constitutional: Negative.   HENT: Negative.   Eyes: Negative.   Respiratory: Negative.   Cardiovascular: Negative.   Gastrointestinal: Negative.   Endocrine: Negative for polyphagia and polyuria.  Genitourinary: Negative for difficulty urinating and hematuria.  Allergic/Immunologic: Negative for immunocompromised state.  Neurological: Negative for weakness and headaches.  Hematological: Does not bruise/bleed easily.  Psychiatric/Behavioral: Negative.     Objective:  BP 128/80 (BP Location: Left Arm, Patient Position: Sitting, Cuff Size: Normal)   Pulse (!) 103   Ht 5\' 10"  (1.778 m)   Wt 210 lb 2 oz (95.3 kg)   SpO2 96%   BMI 30.15 kg/m   BP Readings from Last 3 Encounters:  07/17/17 128/80  06/28/17 130/80  06/22/17 136/70    Wt Readings from Last 3 Encounters:  07/17/17 210 lb 2 oz (95.3 kg)  06/28/17 217 lb (98.4 kg)  06/22/17 217 lb 2  oz (98.5 kg)    Physical Exam  Constitutional: He appears well-developed and well-nourished. No distress.  HENT:  Head: Normocephalic and atraumatic.  Right Ear: External ear normal.  Left Ear: External ear normal.  Eyes: Right eye exhibits no discharge. Left eye exhibits no discharge. No scleral icterus.  Neck: No JVD present. No tracheal deviation present.  Pulmonary/Chest: Effort normal.  Skin: Skin is warm and dry. He is not diaphoretic.  Psychiatric: He has a normal mood and affect. His behavior is normal.   Diabetic Foot Exam - Simple   Simple Foot Form  Visual Inspection  No deformities, no ulcerations, no other skin breakdown bilaterally: Yes  Sensation Testing  Intact to touch and monofilament testing bilaterally: Yes  Pulse Check  Posterior Tibialis and Dorsalis pulse intact bilaterally: Yes  Comments woody discoloration of shins consistent of a diagnosis of dm.  Lab Results  Component Value Date   WBC 7.3 03/22/2017   HGB 14.2 03/22/2017   HCT 43.3 03/22/2017   PLT 163.0 03/22/2017   GLUCOSE 154 (H) 03/16/2017   CHOL 195 03/16/2017   TRIG 102.0 03/16/2017   HDL 40.70 03/16/2017   LDLCALC 134 (H) 03/16/2017   ALT 25 03/16/2017   AST 19 03/16/2017   NA 137 03/16/2017   K 4.8 03/16/2017   CL 107 03/16/2017   CREATININE 1.04 03/16/2017   BUN 18 03/16/2017   CO2 25 03/16/2017  PSA 0.46 03/16/2017   HGBA1C 7.6 (H) 03/22/2017   MICROALBUR 7.7 (H) 03/16/2017    Patient was never admitted.  Assessment & Plan:   Jeremiah Mccoy was seen today for follow-up.  Diagnoses and all orders for this visit:  Type 2 diabetes mellitus with complication, with long-term current use of insulin ([place]) -     Hemoglobin A1c  History of prostate cancer -     PSA  Elevated PSA, less than 10 ng/ml -     PSA  Macrocytosis -     CBC   I am having Jeremiah Mccoy maintain his atorvastatin, lisinopril, metFORMIN, clopidogrel, and ranitidine.  No orders of the defined types  were placed in this encounter.    Follow-up: Return in about 3 months (around 10/16/2017), or if symptoms worsen or fail to improve.  Jeremiah Maw, MD

## 2017-07-18 ENCOUNTER — Ambulatory Visit (INDEPENDENT_AMBULATORY_CARE_PROVIDER_SITE_OTHER): Payer: Medicare Other | Admitting: Adult Health

## 2017-07-18 ENCOUNTER — Encounter: Payer: Self-pay | Admitting: Adult Health

## 2017-07-18 VITALS — BP 126/72 | HR 67 | Ht 70.0 in | Wt 210.0 lb

## 2017-07-18 DIAGNOSIS — R9431 Abnormal electrocardiogram [ECG] [EKG]: Secondary | ICD-10-CM | POA: Diagnosis not present

## 2017-07-18 DIAGNOSIS — I639 Cerebral infarction, unspecified: Secondary | ICD-10-CM | POA: Diagnosis not present

## 2017-07-18 DIAGNOSIS — I2510 Atherosclerotic heart disease of native coronary artery without angina pectoris: Secondary | ICD-10-CM

## 2017-07-18 DIAGNOSIS — E7800 Pure hypercholesterolemia, unspecified: Secondary | ICD-10-CM

## 2017-07-18 LAB — CBC
HCT: 42.3 % (ref 39.0–52.0)
HEMOGLOBIN: 14.3 g/dL (ref 13.0–17.0)
MCHC: 33.7 g/dL (ref 30.0–36.0)
MCV: 99.5 fl (ref 78.0–100.0)
PLATELETS: 162 10*3/uL (ref 150.0–400.0)
RBC: 4.25 Mil/uL (ref 4.22–5.81)
RDW: 13.9 % (ref 11.5–15.5)
WBC: 8.5 10*3/uL (ref 4.0–10.5)

## 2017-07-18 LAB — PSA: PSA: 0.52 ng/mL (ref 0.10–4.00)

## 2017-07-18 LAB — HEMOGLOBIN A1C: Hgb A1c MFr Bld: 7.7 % — ABNORMAL HIGH (ref 4.6–6.5)

## 2017-07-18 NOTE — Progress Notes (Signed)
Cardiology Office Note   Date:  07/18/2017   ID:  Jeremiah Mccoy, DOB 18-Jan-1934, MRN [id number]  PCP:  Libby Maw, MD  Cardiologist: [place] Chief Complaint  Patient presents with  . Cerebrovascular Accident  . New Patient (Initial Visit)     History of Present Illness: Jeremiah Mccoy is a friendly 84 y.o. male who presents for cardiology evaluation at the request of Dr. Ethelene Hal, for patient complaints of chest discomfort, other history includes type 2 diabetes, history of prostate cancer status post prostatectomy, the patient has had episodes of indigestion occurring intermittently. Due to cardiovascular risk factors request stress test was made on referral.  The patient is a retired Psychologist, educational, he is also a English as a second language teacher.  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  He is married, his daughter is a Public house manager in pediatrics at [employer], Her name is Jeremiah Luster, NP.  He has a family history of CVA, colon cancer, and diabetes.  His wife states that in the past he has been medically noncompliant as he does not like to take medications on a regular basis.  The patient does not drink alcohol or smoke nor has he had a history of same.  The patient was admitted in September 2018 to [facility] in [place] in the setting of acute CVA, nonhemorrhagic.  He was treated with what he describes although I do not have records, with TPA therapy, and also spent time in rehabilitation for strengthening post CVA.  Since having CVA he has been medically compliant as his wife provides him with his medications.  He is not very active, but does do some yard work and is not exactly sedentary.  He does not exercise regularly however.  The patient is currently residing in [place] after retiring.  The patient is being followed by PCP and due to multiple cardiovascular risk  factors and some complaints of vague chest discomfort he was referred to cardiology for further workup and also because of abnormal EKG which revealed some lateral T wave abnormalities and a incomplete right bundle branch block.  Past Medical History:  Diagnosis Date  . Cancer ([place])   . Depression   . Diabetes mellitus without complication ([place])   . Stroke [facility])     Past Surgical History:  Procedure Laterality Date  . APPENDECTOMY    . CHOLECYSTECTOMY       Current Outpatient Medications  Medication Sig Dispense Refill  . atorvastatin (LIPITOR) 40 MG tablet Take 40 mg by mouth daily.    . clopidogrel (PLAVIX) 75 MG tablet Take 1 tablet (75 mg total) by mouth daily. 90 tablet 1  . lisinopril (PRINIVIL,ZESTRIL) 2.5 MG tablet Take 2.5 mg by mouth daily.    . metFORMIN (GLUCOPHAGE) 500 MG tablet Take 1 tablet (500 mg total) by mouth 2 (two) times daily with a meal. 60 tablet 2  . ranitidine (ZANTAC) 300 MG tablet Take 1 tablet (300 mg total) by mouth at bedtime. 30 tablet 1   No current facility-administered medications for this visit.     Allergies:   Morphine and related; Motrin [ibuprofen]; and Sulfa antibiotics    Social History:  The patient  reports that he has never smoked. He has never used smokeless tobacco. He reports that he does not drink alcohol or use drugs.   Family History:  The patient's family history includes Cancer in his mother; Diabetes in  his brother; Stroke in his father.    ROS: All other systems are reviewed and negative. Unless otherwise mentioned in H&P    PHYSICAL EXAM: VS:  BP 126/72   Pulse 67   Ht 5\' 10"  (1.778 m)   Wt 210 lb (95.3 kg)   BMI 30.13 kg/m  , BMI Body mass index is 30.13 kg/m. GEN: Well nourished, well developed, in no acute distress obese HEENT: normal  Neck: no JVD, carotid bruits, or masses Cardiac: RRR; 1/6 systolic murmurs, rubs, or gallops,no edema  Respiratory:  Clear to auscultation bilaterally, normal work of  breathing GI: soft, nontender, nondistended, + BS MS: no deformity or atrophy  Skin: warm and dry, no rash Neuro:  Strength and sensation are intact Psych: euthymic mood, full affect   EKG: Normal sinus rhythm, incomplete right bundle branch block with a nonspecific T wave abnormality laterally, heart rate of 67 bpm.  Recent Labs: 03/16/2017: ALT 25; BUN 18; Creatinine, Ser 1.04; Potassium 4.8; Sodium 137 07/17/2017: Hemoglobin 14.3; Platelets 162.0    Lipid Panel    Component Value Date/Time   CHOL 195 03/16/2017 1353   TRIG 102.0 03/16/2017 1353   HDL 40.70 03/16/2017 1353   CHOLHDL 5 03/16/2017 1353   VLDL 20.4 03/16/2017 1353   LDLCALC 134 (H) 03/16/2017 1353      Wt Readings from Last 3 Encounters:  07/18/17 210 lb (95.3 kg)  07/17/17 210 lb 2 oz (95.3 kg)  06/28/17 217 lb (98.4 kg)      Other studies Reviewed: We are requesting records from [facility], [place], [place].  We have contacted them by phone and also faxed request.  ASSESSMENT AND PLAN:  1.  History of nonhemorrhagic CVA: The patient continues on Plavix.  He has not been followed by neurology since being retired here in [place].  He is followed by his primary care and has had no further symptoms.  I am requesting records from hospitalization in September 2018 where he was treated for CVA.  At that time an echocardiogram was completed, and records are requested concerning this as well.  If we do not receive records in a timely manner, we will plan to repeat echo.  2.  Abnormal EKG: Due to multiple cardiovascular risk factors of diabetes, hypertension, CVA, obesity, will plan a Lexiscan Myoview for diagnostic prognostic purposes.  I have explained to the patient that if his stress test is abnormal we will need to discuss further interventional testing to include cardiac catheterization.  He verbalizes understanding and is ready to proceed with any further testing if necessary.  3.  Diabetes:  Followed by PCP.  Had not been well controlled in the past.  Most recent hemoglobin A1c dated 07/17/2017 was 7.7.  4. Hypercholesterolemia: The patient is currently on atorvastatin 40 mg daily.  Labs per PCP.  Most recent labs were on 03/16/2017 with a total cholesterol of 195, LDL 134, triglycerides of 102, HDL of 40.7.  On next office visit we will need to repeat these labs with an LDL goal of less than 70.  Will likely need to increase atorvastatin to 80 mg daily.  Current medicines are reviewed at length with the patient today.  I have also discussed this patient with Dr. Ellyn Hack, who is DOD in the office today.  He is in agreement with my assessment and plan.  Labs/ tests ordered today include: Lexiscan Myoview   Phill Myron. West Pugh, ANP, AACC   07/18/2017 5:26 PM  [facility] [address] [place], [place] [address] Phone: [phone]; Fax: [phone]

## 2017-07-18 NOTE — Patient Instructions (Addendum)
Testing/Procedures: Your physician has requested that you have a lexiscan myoview. A cardiac stress test is a cardiological test that measures the heart's ability to respond to external stress in a controlled clinical environment. The stress response is induced byintravenous pharmacological stimulation. For further information please visit [facility]. Please follow instructions below. How to prepare for your Myocardial Perfusion Test:  Hold: nothing to hold the day of the testing  Do not eat or drink 3 hours prior to your test, except you may have water.  Do not consume products containing caffeine (regular or decaffeinated) 12 hours prior to your test. (ex: coffee, chocolate, sodas, tea).  Do wear comfortable clothes (no dresses or overalls) and walking shoes, tennis shoes preferred (No heels or open toe shoes are allowed).  Do NOT wear cologne, perfume, aftershave, or lotions (deodorant is allowed).  If these instructions are not followed, your test will have to be rescheduled.  If you have questions or concerns about your appointment, you can call the Nuclear Lab at [phone].  Follow-Up: Your physician wants you to follow-up in: after testing with Dr Ellyn Hack -OR- KATHRYN LAWRENCE ([place]), DNP,AACC IF PRIMARY CARDIOLOGIST IS UNAVAILABLE.    Thank you for choosing [facility] at [facility]!!

## 2017-07-20 ENCOUNTER — Other Ambulatory Visit: Payer: Self-pay | Admitting: Family Medicine

## 2017-07-20 DIAGNOSIS — Z794 Long term (current) use of insulin: Principal | ICD-10-CM

## 2017-07-20 DIAGNOSIS — E118 Type 2 diabetes mellitus with unspecified complications: Secondary | ICD-10-CM

## 2017-07-24 ENCOUNTER — Telehealth: Payer: Self-pay | Admitting: Adult Health

## 2017-07-24 NOTE — Telephone Encounter (Signed)
New Message   Patients wife called because her husband wanted to cancel the stress test that was schedule. Reason being is because he does not want the chemical induce stress test. She wants to know if he can do the exercise tolerance test walking on the treadmill. Please call to discuss.

## 2017-07-24 NOTE — Telephone Encounter (Signed)
Message sent to scheduling to call back and reschedule

## 2017-07-24 NOTE — Addendum Note (Signed)
Addended by: Waylan Rocher on: 07/24/2017 02:21 PM   Modules accepted: Orders

## 2017-07-25 ENCOUNTER — Telehealth: Payer: Self-pay | Admitting: *Deleted

## 2017-07-25 NOTE — Telephone Encounter (Signed)
Called to schedule exercise myoview -----Jeremiah Mccoy states her husband is refusing to have any testing done.

## 2017-07-28 ENCOUNTER — Encounter (HOSPITAL_COMMUNITY): Payer: Medicare Other

## 2017-07-28 NOTE — Telephone Encounter (Signed)
Call was returned to pt he states that he does not want to have any procedure done. Jeremiah Mccoy notified she will talk to him aga at scheduled appt

## 2017-08-14 NOTE — Progress Notes (Deleted)
Cardiology Office Note   Date:  08/14/2017   ID:  Severn Goddard, DOB 06-Sep-1933, MRN [id number]  PCP:  Libby Maw, MD  Cardiologist: Dr. Ellyn Hack   No chief complaint on file.    History of Present Illness: Jeremiah Mccoy is a 84 y.o. male who presents for ongoing assessment and management of hx of non-hemorrhagic CVA,who was seen for the first time in our practice 07/18/2017, due to multiple CVRF and abnormal EKG. He was scheduled for a NM stress test for diagnostic/prognostic purposes. He has refused this test.     Past Medical History:  Diagnosis Date  . Cancer ([place])   . Depression   . Diabetes mellitus without complication ([place])   . Stroke [facility])     Past Surgical History:  Procedure Laterality Date  . APPENDECTOMY    . CHOLECYSTECTOMY       Current Outpatient Medications  Medication Sig Dispense Refill  . atorvastatin (LIPITOR) 40 MG tablet Take 40 mg by mouth daily.    . clopidogrel (PLAVIX) 75 MG tablet Take 1 tablet (75 mg total) by mouth daily. 90 tablet 1  . lisinopril (PRINIVIL,ZESTRIL) 2.5 MG tablet Take 2.5 mg by mouth daily.    . metFORMIN (GLUCOPHAGE) 500 MG tablet TAKE 1 TABLET BY MOUTH TWICE DAILY WITH MEAL 60 tablet 2  . ranitidine (ZANTAC) 300 MG tablet Take 1 tablet (300 mg total) by mouth at bedtime. 30 tablet 1   No current facility-administered medications for this visit.     Allergies:   Morphine and related; Motrin [ibuprofen]; and Sulfa antibiotics    Social History:  The patient  reports that he has never smoked. He has never used smokeless tobacco. He reports that he does not drink alcohol or use drugs.   Family History:  The patient's family history includes Cancer in his mother; Diabetes in his brother; Stroke in his father.    ROS: All other systems are reviewed and negative. Unless otherwise mentioned in H&P    PHYSICAL EXAM: VS:  There were no vitals taken for this visit. , BMI There is no height or weight on file to  calculate BMI. GEN: Well nourished, well developed, in no acute distress  HEENT: normal  Neck: no JVD, carotid bruits, or masses Cardiac: ***RRR; no murmurs, rubs, or gallops,no edema  Respiratory:  clear to auscultation bilaterally, normal work of breathing GI: soft, nontender, nondistended, + BS MS: no deformity or atrophy  Skin: warm and dry, no rash Neuro:  Strength and sensation are intact Psych: euthymic mood, full affect   EKG:  EKG {ACTION; IS/IS EPP:[id number]} ordered today. The ekg ordered today demonstrates ***   Recent Labs: 03/16/2017: ALT 25; BUN 18; Creatinine, Ser 1.04; Potassium 4.8; Sodium 137 07/17/2017: Hemoglobin 14.3; Platelets 162.0    Lipid Panel    Component Value Date/Time   CHOL 195 03/16/2017 1353   TRIG 102.0 03/16/2017 1353   HDL 40.70 03/16/2017 1353   CHOLHDL 5 03/16/2017 1353   VLDL 20.4 03/16/2017 1353   LDLCALC 134 (H) 03/16/2017 1353      Wt Readings from Last 3 Encounters:  07/18/17 210 lb (95.3 kg)  07/17/17 210 lb 2 oz (95.3 kg)  06/28/17 217 lb (98.4 kg)      Other studies Reviewed: Additional studies/ records that were reviewed today include: ***. Review of the above records demonstrates: ***   ASSESSMENT AND PLAN:  1.  ***   Current medicines are reviewed at length with the patient today.  Labs/ tests ordered today include: *** Phill Myron. West Pugh, ANP, AACC   08/14/2017 4:15 PM    [facility] [address] [place], [place] [address] Phone: [phone]; Fax: [phone]

## 2017-08-16 ENCOUNTER — Ambulatory Visit: Payer: Medicare Other | Admitting: Adult Health

## 2017-09-21 DIAGNOSIS — N393 Stress incontinence (female) (male): Secondary | ICD-10-CM | POA: Diagnosis not present

## 2017-09-21 DIAGNOSIS — R9721 Rising PSA following treatment for malignant neoplasm of prostate: Secondary | ICD-10-CM | POA: Diagnosis not present

## 2017-09-21 DIAGNOSIS — C61 Malignant neoplasm of prostate: Secondary | ICD-10-CM | POA: Diagnosis not present

## 2017-09-25 ENCOUNTER — Other Ambulatory Visit: Payer: Self-pay | Admitting: Urology

## 2017-09-25 DIAGNOSIS — C61 Malignant neoplasm of prostate: Secondary | ICD-10-CM

## 2017-10-05 ENCOUNTER — Encounter: Payer: Self-pay | Admitting: Family Medicine

## 2017-10-10 ENCOUNTER — Encounter (HOSPITAL_COMMUNITY)
Admission: RE | Admit: 2017-10-10 | Discharge: 2017-10-10 | Disposition: A | Discharge status: Home / Self Care | Payer: Medicare Other | Source: Ambulatory Visit | Attending: Urology | Admitting: Urology

## 2017-10-10 DIAGNOSIS — C61 Malignant neoplasm of prostate: Secondary | ICD-10-CM | POA: Diagnosis not present

## 2017-10-12 ENCOUNTER — Encounter: Payer: Self-pay | Admitting: Podiatry

## 2017-10-12 ENCOUNTER — Ambulatory Visit (INDEPENDENT_AMBULATORY_CARE_PROVIDER_SITE_OTHER): Payer: Medicare Other | Admitting: Podiatry

## 2017-10-12 DIAGNOSIS — B351 Tinea unguium: Secondary | ICD-10-CM | POA: Diagnosis not present

## 2017-10-12 DIAGNOSIS — E1151 Type 2 diabetes mellitus with diabetic peripheral angiopathy without gangrene: Secondary | ICD-10-CM

## 2017-10-16 NOTE — Progress Notes (Signed)
  Subjective:  Patient ID: Jeremiah Mccoy, male    DOB: [birth year],  MRN: [id number]  Chief Complaint  Patient presents with  . Nail Problem    i have some toenails that need to be trimmed up    84 y.o. male returns for diabetic foot care.  States he lost the left great toenail in September 2018 after falling from a stroke but it grew back.  Objective:  There were no vitals filed for this visit. General AA&O x3. Normal mood and affect.  Vascular Dorsalis pedis pulses present 1+ bilaterally  Posterior tibial pulses absent bilaterally  Capillary refill normal to all digits. Pedal hair growth diminished.  Neurologic Epicritic sensation present bilaterally. Protective sensation present bilat.  Dermatologic No open lesions. Interspaces clear of maceration.  Normal skin temperature and turgor. Hyperkeratotic lesions: none bilaterally. Nails: Elongated dystrophic  Orthopedic: No history of amputation. MMT 5/5 in dorsiflexion, plantarflexion, inversion, and eversion. Normal lower extremity joint ROM without pain or crepitus.   Assessment & Plan:  Patient was evaluated and treated and all questions answered.  Diabetes with PAD, Onychomycosis  -Educated on diabetic footcare. Diabetic risk level 1 -At risk foot care provided as below.  Procedure: Nail Debridement Rationale: Patient meets criteria for routine foot care due to 1 class B, 2 class C findings. Type of Debridement: manual, sharp debridement. Instrumentation: Nail nipper, rotary burr. Number of Nails: 10     Return in about 3 months (around 01/12/2018) for Diabetic Foot Care.

## 2017-10-19 ENCOUNTER — Ambulatory Visit (INDEPENDENT_AMBULATORY_CARE_PROVIDER_SITE_OTHER): Payer: Medicare Other | Admitting: Family Medicine

## 2017-10-19 ENCOUNTER — Encounter: Payer: Self-pay | Admitting: Family Medicine

## 2017-10-19 VITALS — BP 128/80 | HR 77 | Ht 70.0 in | Wt 207.4 lb

## 2017-10-19 DIAGNOSIS — Z91199 Patient's noncompliance with other medical treatment and regimen due to unspecified reason: Secondary | ICD-10-CM

## 2017-10-19 DIAGNOSIS — Z8673 Personal history of transient ischemic attack (TIA), and cerebral infarction without residual deficits: Secondary | ICD-10-CM

## 2017-10-19 DIAGNOSIS — E118 Type 2 diabetes mellitus with unspecified complications: Secondary | ICD-10-CM | POA: Diagnosis not present

## 2017-10-19 DIAGNOSIS — Z794 Long term (current) use of insulin: Secondary | ICD-10-CM | POA: Diagnosis not present

## 2017-10-19 DIAGNOSIS — Z9119 Patient's noncompliance with other medical treatment and regimen: Secondary | ICD-10-CM | POA: Diagnosis not present

## 2017-10-19 DIAGNOSIS — S301XXA Contusion of abdominal wall, initial encounter: Secondary | ICD-10-CM | POA: Diagnosis not present

## 2017-10-19 DIAGNOSIS — S3011XA Contusion of abdominal wall, initial encounter: Secondary | ICD-10-CM

## 2017-10-19 NOTE — Progress Notes (Signed)
Subjective:  Patient ID: Jeremiah Mccoy, male    DOB: 1933-07-31  Age: 84 y.o. MRN: [id number]  CC: Follow-up   HPI Kelsey Edman presents for evaluation of a lump in his abdominal wall status post falling against the corner of the TV.  Patient's wife reports that he is only taking his metformin.  He is actually taking it intermittently.  The rest of his medicines he is discontinued.  He did follow-up with the urologist and is scheduled to see the oncologist for follow-up of his prostate cancer.  He is status post prostatectomy.  Review of chart shows a negative pet scan regarding recurrence of prostate cancer.  Over the last several months he has had a mild increase in his PSA from 0.47-0.52.  His wife tells me that it is been close to 0 in the past.  Patient admits that he would forego treatment even if disease were found.  Outpatient Medications Prior to Visit  Medication Sig Dispense Refill  . atorvastatin (LIPITOR) 40 MG tablet Take 40 mg by mouth daily.    . clopidogrel (PLAVIX) 75 MG tablet Take 1 tablet (75 mg total) by mouth daily. 90 tablet 1  . lisinopril (PRINIVIL,ZESTRIL) 2.5 MG tablet Take 2.5 mg by mouth daily.    . metFORMIN (GLUCOPHAGE) 500 MG tablet TAKE 1 TABLET BY MOUTH TWICE DAILY WITH MEAL 60 tablet 2  . ranitidine (ZANTAC) 300 MG tablet Take 1 tablet (300 mg total) by mouth at bedtime. 30 tablet 1   No facility-administered medications prior to visit.     ROS Review of Systems  Constitutional: Negative.   Respiratory: Negative.   Cardiovascular: Negative.   Gastrointestinal: Negative.   Skin: Negative.     Objective:  BP 128/80   Pulse 77   Ht 5\' 10"  (1.778 m)   Wt 207 lb 6 oz (94.1 kg)   SpO2 95%   BMI 29.76 kg/m   BP Readings from Last 3 Encounters:  10/19/17 128/80  07/18/17 126/72  07/17/17 128/80    Wt Readings from Last 3 Encounters:  10/19/17 207 lb 6 oz (94.1 kg)  07/18/17 210 lb (95.3 kg)  07/17/17 210 lb 2 oz (95.3 kg)    Physical  Exam  Lab Results  Component Value Date   WBC 8.5 07/17/2017   HGB 14.3 07/17/2017   HCT 42.3 07/17/2017   PLT 162.0 07/17/2017   GLUCOSE 154 (H) 03/16/2017   CHOL 195 03/16/2017   TRIG 102.0 03/16/2017   HDL 40.70 03/16/2017   LDLCALC 134 (H) 03/16/2017   ALT 25 03/16/2017   AST 19 03/16/2017   NA 137 03/16/2017   K 4.8 03/16/2017   CL 107 03/16/2017   CREATININE 1.04 03/16/2017   BUN 18 03/16/2017   CO2 25 03/16/2017   PSA 0.52 07/17/2017   HGBA1C 7.7 (H) 07/17/2017   MICROALBUR 7.7 (H) 03/16/2017    Nm Pet (axumin) Skull Base To Mid Thigh  Result Date: 10/11/2017 CLINICAL DATA:  Prostate carcinoma with biochemical recurrence. Status post prostatectomy. PSA equal 0.52 on 07/17/2017 EXAM: NUCLEAR MEDICINE PET SKULL BASE TO THIGH TECHNIQUE: 10.5 mCi F-18 Fluciclovine was injected intravenously. Full-ring PET imaging was performed from the skull base to thigh after the radiotracer. CT data was obtained and used for attenuation correction and anatomic localization. COMPARISON:  None. FINDINGS: NECK No radiotracer activity in neck lymph nodes. Incidental CT finding: None CHEST No radiotracer accumulation within mediastinal or hilar lymph nodes. No suspicious pulmonary nodules on the CT  scan. Loculated LEFT pleural effusion with thin rim in the LEFT lower lobe. Effusion measures 2.8 cm in depth posteriorly. Mild round atelectasis in the LEFT lower lobe. Incidental CT finding: Coronary artery calcification and aortic atherosclerotic calcification. ABDOMEN/PELVIS Prostate: No focal activity in the prostate bed. Lymph nodes: No abnormal radiotracer accumulation within pelvic or abdominal nodes. Liver: No evidence of liver metastasis Incidental CT finding: None SKELETON No focal  activity to suggest skeletal metastasis. IMPRESSION: 1. No evidence of prostate cancer local recurrence in the prostate bed. 2. No evidence metastatic adenopathy in the abdomen pelvis. 3. No evidence of distant  metastatic disease or skeletal disease. 4. Loculated pleural fluid in the LEFT lower lobe with associated rounded atelectasis. Electronically Signed   By: Suzy Bouchard M.D.   On: 10/11/2017 08:19    Assessment & Plan:   Random was seen today for follow-up.  Diagnoses and all orders for this visit:  Type 2 diabetes mellitus with complication, with long-term current use of insulin ([facility])  History of stroke  Non-compliance  Traumatic ecchymosis of abdominal wall, initial encounter   I am having [name] maintain his atorvastatin, lisinopril, clopidogrel, ranitidine, and metFORMIN.  No orders of the defined types were placed in this encounter.  Advised patient and his wife that the abdominal wall ecchymosis with hematoma will resolve over time.  No treatment is needed.  Reminded him that the Plavix and atorvastatin are approved prescribed to prevent another stroke.   Follow-up: Return in about 3 months (around 01/19/2018).  Libby Maw, MD

## 2017-10-24 ENCOUNTER — Encounter: Payer: Self-pay | Admitting: Radiation Oncology

## 2017-10-24 DIAGNOSIS — C61 Malignant neoplasm of prostate: Secondary | ICD-10-CM | POA: Insufficient documentation

## 2017-10-24 NOTE — Progress Notes (Signed)
GU Location of Tumor / Histology: biochemical recurrent prostatic adenocarcinoma s/p 2011 prostatectomy. Gleason Score is (3 + 3).  07/17/2017 PSA  0.52 01/2017 PSA  0.46    Past/Anticipated interventions by urology, if any: prostatectomy by Dr. Burton Apley in [place], PSA, [place] PET (negative), referral to radiation oncology for consideration of salvage radiation therapy  Past/Anticipated interventions by medical oncology, if any: no  Weight changes, if any: no  Bowel/Bladder complaints, if any: Urinary frequency, urinary urgency, nocturia, urinary leakage, and ED. Denies dysuria or hematuria. IPSS 13  Nausea/Vomiting, if any: no  Pain issues, if any:  Denies new pain.  SAFETY ISSUES:  Prior radiation? no  Pacemaker/ICD? no  Possible current pregnancy? no  Is the patient on methotrexate? no  Current Complaints / other details:  84 year old male. Retired. Married. Mother with hx of colon ca. Patient fell and hit the side of the TV a little more than a week ago and has a hematoma on his right side.

## 2017-10-25 ENCOUNTER — Encounter: Payer: Self-pay | Admitting: Radiation Oncology

## 2017-10-25 ENCOUNTER — Ambulatory Visit
Admission: RE | Admit: 2017-10-25 | Discharge: 2017-10-25 | Disposition: A | Discharge status: Home / Self Care | Payer: Medicare Other | Source: Ambulatory Visit | Attending: Radiation Oncology | Admitting: Radiation Oncology

## 2017-10-25 ENCOUNTER — Other Ambulatory Visit: Payer: Self-pay

## 2017-10-25 DIAGNOSIS — Z79899 Other long term (current) drug therapy: Secondary | ICD-10-CM | POA: Insufficient documentation

## 2017-10-25 DIAGNOSIS — Z9079 Acquired absence of other genital organ(s): Secondary | ICD-10-CM | POA: Diagnosis not present

## 2017-10-25 DIAGNOSIS — Z882 Allergy status to sulfonamides status: Secondary | ICD-10-CM | POA: Insufficient documentation

## 2017-10-25 DIAGNOSIS — C61 Malignant neoplasm of prostate: Secondary | ICD-10-CM | POA: Diagnosis not present

## 2017-10-25 DIAGNOSIS — E119 Type 2 diabetes mellitus without complications: Secondary | ICD-10-CM | POA: Diagnosis not present

## 2017-10-25 DIAGNOSIS — Z885 Allergy status to narcotic agent status: Secondary | ICD-10-CM | POA: Diagnosis not present

## 2017-10-25 DIAGNOSIS — Z7984 Long term (current) use of oral hypoglycemic drugs: Secondary | ICD-10-CM | POA: Insufficient documentation

## 2017-10-25 DIAGNOSIS — Z7902 Long term (current) use of antithrombotics/antiplatelets: Secondary | ICD-10-CM | POA: Diagnosis not present

## 2017-10-25 DIAGNOSIS — Z8673 Personal history of transient ischemic attack (TIA), and cerebral infarction without residual deficits: Secondary | ICD-10-CM | POA: Diagnosis not present

## 2017-10-25 DIAGNOSIS — Z886 Allergy status to analgesic agent status: Secondary | ICD-10-CM | POA: Diagnosis not present

## 2017-10-25 DIAGNOSIS — R9721 Rising PSA following treatment for malignant neoplasm of prostate: Secondary | ICD-10-CM | POA: Diagnosis not present

## 2017-10-25 HISTORY — DX: Malignant neoplasm of prostate: C61

## 2017-10-25 NOTE — Progress Notes (Signed)
See progress note under physician encounter. 

## 2017-10-25 NOTE — Progress Notes (Signed)
Radiation Oncology         [phone] ________________________________  Initial outpatient Consultation  Name: Jeremiah Mccoy MRN: [id number]  Date: 10/25/2017  DOB: 1933/06/24  PR:FFMBWG, Mortimer Fries, MD  Lucas Mallow, MD   REFERRING PHYSICIAN: Lucas Mallow, MD  DIAGNOSIS: 84 y.o. gentleman with a rising, detectable PSA of 0.52 s/p RALP for cT1c, Gleason 4+4 adenocarcinoma of the prostate with preop PSA of 8.08.     ICD-10-CM   1. Recurrent prostate cancer ([place]) C61     HISTORY OF PRESENT ILLNESS: Jeremiah Mccoy is a 84 y.o. male with a rising, detectable PSA of 0.52 s/p RALP for cT1c, Gleason 4+4 adenocarcinoma of the prostate with pretreatment PSA of 8.08.  His prostate cancer was initially diagnosed and treated by Dr. Burton Apley in [place], [place] in 01/2010.  He reports that his initial postop PSA was undetectable.  Since that time, he and his wife have relocated to [place] in 2018 and established care with a local primary care physician, Dr. Ethelene Hal.  PSA report from 10/06/2015 noted a PSA of 0.15.  He had a repeat PSA with Dr. Ethelene Hal in December 2018 which was 0.46 and more recently, had a PSA on 07/17/2017 which was 0.52.  He was therefore referred to Dr. Gloriann Loan for further urologic evaluation.  An Axumin PET scan was performed on 10/10/2017 and failed to reveal any evidence of prostate cancer local recurrence in the prostate bed, evidence of metastatic adenopathy in the abdomen or pelvis or distant metastatic or skeletal disease.    The patient reviewed the PSA and imaging results with his urologist and he has kindly been referred today for discussion of potential salvage radiation treatment options.   PREVIOUS RADIATION THERAPY: No  PAST MEDICAL HISTORY:  Past Medical History:  Diagnosis Date  . Cancer ([place])   . Depression   . Diabetes mellitus without complication ([place])   . Prostate cancer ([place])   . Stroke [facility])       PAST SURGICAL HISTORY: Past  Surgical History:  Procedure Laterality Date  . APPENDECTOMY    . CHOLECYSTECTOMY    . PROSTATECTOMY      FAMILY HISTORY:  Family History  Problem Relation Age of Onset  . Colon cancer Mother   . Stroke Father   . Diabetes Brother   . Breast cancer Neg Hx   . Prostate cancer Neg Hx   . Pancreatic cancer Neg Hx     SOCIAL HISTORY:  Social History   Socioeconomic History  . Marital status: Married    Spouse name: Not on file  . Number of children: Not on file  . Years of education: Not on file  . Highest education level: Not on file  Occupational History  . Occupation: retired    Comment: traffic cop for 22 years  Social Needs  . Financial resource strain: Not on file  . Food insecurity:    Worry: Not on file    Inability: Not on file  . Transportation needs:    Medical: Not on file    Non-medical: Not on file  Tobacco Use  . Smoking status: Never Smoker  . Smokeless tobacco: Never Used  Substance and Sexual Activity  . Alcohol use: No    Frequency: Never  . Drug use: No  . Sexual activity: Never  Lifestyle  . Physical activity:    Days per week: Not on file    Minutes per session: Not on file  .  Stress: Not on file  Relationships  . Social connections:    Talks on phone: Not on file    Gets together: Not on file    Attends religious service: Not on file    Active member of club or organization: Not on file    Attends meetings of clubs or organizations: Not on file    Relationship status: Not on file  . Intimate partner violence:    Fear of current or ex partner: Not on file    Emotionally abused: Not on file    Physically abused: Not on file    Forced sexual activity: Not on file  Other Topics Concern  . Not on file  Social History Narrative   Married for 57 years (2019). Moved to [place] in November 2018 to be closer to their daughter. Patient from [facility]. 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     ALLERGIES: Morphine and related; Motrin  [ibuprofen]; and Sulfa antibiotics  MEDICATIONS:  Current Outpatient Medications  Medication Sig Dispense Refill  . atorvastatin (LIPITOR) 40 MG tablet Take 40 mg by mouth daily.    . clopidogrel (PLAVIX) 75 MG tablet Take 1 tablet (75 mg total) by mouth daily. 90 tablet 1  . lisinopril (PRINIVIL,ZESTRIL) 2.5 MG tablet Take 2.5 mg by mouth daily.    . metFORMIN (GLUCOPHAGE) 500 MG tablet TAKE 1 TABLET BY MOUTH TWICE DAILY WITH MEAL 60 tablet 2  . ranitidine (ZANTAC) 300 MG tablet Take 1 tablet (300 mg total) by mouth at bedtime. 30 tablet 1   No current facility-administered medications for this encounter.     REVIEW OF SYSTEMS:  On review of systems, the patient reports that he is doing well overall. He denies any chest pain, shortness of breath, cough, fevers, chills, night sweats, unintended weight changes. He notes urinary frequency, urinary urgency, nocturia, urinary leakage, and ED. He denies dysuria, hematuria, abdominal pain, nausea or vomiting. He denies any new musculoskeletal or joint aches or pains. His IPSS was 13, indicating moderate urinary symptoms. He is unable to complete sexual activity with most attempts. A complete review of systems is obtained and is otherwise negative.    PHYSICAL EXAM:  Wt Readings from Last 3 Encounters:  10/25/17 206 lb 12.8 oz (93.8 kg)  10/19/17 207 lb 6 oz (94.1 kg)  07/18/17 210 lb (95.3 kg)   Temp Readings from Last 3 Encounters:  10/25/17 97.8 F (36.6 C) (Oral)  03/15/17 (!) 97.4 F (36.3 C) (Oral)   BP Readings from Last 3 Encounters:  10/25/17 135/79  10/19/17 128/80  07/18/17 126/72   Pulse Readings from Last 3 Encounters:  10/25/17 72  10/19/17 77  07/18/17 67   Pain Assessment Pain Score: 0-No pain/10  In general this is a well appearing Caucasian man in no acute distress. He presents in a wheelchair. He is alert and oriented x4 and appropriate throughout the examination. HEENT reveals that the patient is normocephalic,  atraumatic. EOMs are intact. PERRLA. Skin is intact without any evidence of gross lesions. Cardiovascular exam reveals a regular rate and rhythm, no clicks rubs or murmurs are auscultated. Chest is clear to auscultation bilaterally. Lymphatic assessment is performed and does not reveal any adenopathy in the cervical, supraclavicular, axillary, or inguinal chains. Abdomen has active bowel sounds in all quadrants and is intact. The abdomen is soft, non tender, non distended. Lower extremities are negative for pretibial pitting edema, deep calf tenderness, cyanosis or clubbing.   KPS = 80  100 - Normal; no complaints; no evidence of disease. 90   - Able to carry on normal activity; minor signs or symptoms of disease. 80   - Normal activity with effort; some signs or symptoms of disease. 72   - Cares for self; unable to carry on normal activity or to do active work. 60   - Requires occasional assistance, but is able to care for most of his personal needs. 50   - Requires considerable assistance and frequent medical care. 21   - Disabled; requires special care and assistance. 98   - Severely disabled; hospital admission is indicated although death not imminent. 36   - Very sick; hospital admission necessary; active supportive treatment necessary. 10   - Moribund; fatal processes progressing rapidly. 0     - Dead  Karnofsky DA, Abelmann [place], Craver LS and Burchenal [facility] [phone]) The use of the nitrogen mustards in the palliative treatment of carcinoma: with particular reference to bronchogenic carcinoma Cancer 1 634-56  LABORATORY DATA:  Lab Results  Component Value Date   WBC 8.5 07/17/2017   HGB 14.3 07/17/2017   HCT 42.3 07/17/2017   MCV 99.5 07/17/2017   PLT 162.0 07/17/2017   Lab Results  Component Value Date   NA 137 03/16/2017   K 4.8 03/16/2017   CL 107 03/16/2017   CO2 25 03/16/2017   Lab Results  Component Value Date   ALT 25 03/16/2017   AST 19 03/16/2017   ALKPHOS 61  03/16/2017   BILITOT 0.5 03/16/2017     RADIOGRAPHY: Nm Pet (axumin) Skull Base To Mid Thigh  Result Date: 10/11/2017 CLINICAL DATA:  Prostate carcinoma with biochemical recurrence. Status post prostatectomy. PSA equal 0.52 on 07/17/2017 EXAM: NUCLEAR MEDICINE PET SKULL BASE TO THIGH TECHNIQUE: 10.5 mCi F-18 Fluciclovine was injected intravenously. Full-ring PET imaging was performed from the skull base to thigh after the radiotracer. CT data was obtained and used for attenuation correction and anatomic localization. COMPARISON:  None. FINDINGS: NECK No radiotracer activity in neck lymph nodes. Incidental CT finding: None CHEST No radiotracer accumulation within mediastinal or hilar lymph nodes. No suspicious pulmonary nodules on the CT scan. Loculated LEFT pleural effusion with thin rim in the LEFT lower lobe. Effusion measures 2.8 cm in depth posteriorly. Mild round atelectasis in the LEFT lower lobe. Incidental CT finding: Coronary artery calcification and aortic atherosclerotic calcification. ABDOMEN/PELVIS Prostate: No focal activity in the prostate bed. Lymph nodes: No abnormal radiotracer accumulation within pelvic or abdominal nodes. Liver: No evidence of liver metastasis Incidental CT finding: None SKELETON No focal  activity to suggest skeletal metastasis. IMPRESSION: 1. No evidence of prostate cancer local recurrence in the prostate bed. 2. No evidence metastatic adenopathy in the abdomen pelvis. 3. No evidence of distant metastatic disease or skeletal disease. 4. Loculated pleural fluid in the LEFT lower lobe with associated rounded atelectasis. Electronically Signed   By: Suzy Bouchard M.D.   On: 10/11/2017 08:19      IMPRESSION/PLAN: 1. 84 y.o. gentleman with a rising, detectable PSA of 0.52 s/p RALP for cT1c, Gleason 4+4 adenocarcinoma of the prostate with pretreatment PSA of 8.08.  Today, we talked to the patient and his wife about the findings and workup thus far. We discussed the  natural history of prostate carcinoma and general treatment, highlighting the role of salvage radiotherapy in the management. We discussed the available radiation techniques, and focused on the details of logistics and delivery. We reviewed the anticipated acute and late sequelae  associated with radiation in this setting. We also discussed the role of ADT in combination with salvage radiotherapy and outlined the associated side effects that could be expected with this therapy.  The recommendation is for ST-ADT combined with a 7.5 week course of daily treatment radiotherapy to the prostate fossa.  The patient was encouraged to ask questions that were answered to his satisfaction.  At the conclusion of our conversation, the patient elects to proceed with ST-ADT in combination with salvage radiotherapy to the prostate fossa as recommended.  He would like to start ADT in the near future and delay the start of salvage radiotherapy until they return from selling their house in [place] which is felt to be perfectly acceptable. We will share our discussion with Dr. Gloriann Loan and coordinate a follow-up visit in his office within the next week to initiate ADT.  I have provided the patient with my business card and he will contact our office once they return from [place] and are ready to start radiotherapy.  At that time, we will coordinate for CT simulation prior to starting salvage radiotherapy.  He appears to have a good understanding of his disease and our recommendations and is in agreement with the stated plan.  He knows to call at anytime with questions or concerns.  We enjoyed meeting the patient and his wife today and look forward to participating in his care.  We spent 60 minutes minutes face to face with the patient and more than 50% of that time was spent in counseling and/or coordination of care.   Nicholos Johns, PA-C    Tyler Pita, MD  [facility] Direct Dial: [phone]   Fax: [phone] [email or web address]  Skype  [email or web address]  This document serves as a record of services personally performed by Tyler Pita, MD and Freeman Caldron, PA-C. It was created on their behalf by Wilburn Mylar, a trained medical scribe. The creation of this record is based on the scribe's personal observations and the provider's statements to them. This document has been checked and approved by the attending provider.

## 2017-10-26 ENCOUNTER — Encounter: Payer: Self-pay | Admitting: Medical Oncology

## 2017-11-13 DIAGNOSIS — C61 Malignant neoplasm of prostate: Secondary | ICD-10-CM | POA: Diagnosis not present

## 2017-11-13 DIAGNOSIS — Z5111 Encounter for antineoplastic chemotherapy: Secondary | ICD-10-CM | POA: Diagnosis not present

## 2017-11-20 ENCOUNTER — Telehealth: Payer: Self-pay

## 2017-11-20 NOTE — Telephone Encounter (Signed)
I called and spoke with patient's wife. She is needing someone who can come stay with patient during the times that she has to go out and do stuff such as grocery shopping. I advised her that I would reach out to [facility] and see if they can help with this or see if they know of someone who can. I have sent a message to our rep, Darlina Guys, and am waiting for a response back. Patient's wife is aware that I will update her once I have some new information.    Copied from [place] [phone]. Topic: General - Other >> Nov 20, 2017  1:09 PM Yvette Rack wrote: Reason for CRM: Pt wife [name] states she needs to speak with a nurse or medical assistant about getting pt an aide or some type of helper as she is the only caregiver and it is starting to be a lot for her to handle. Cb# [phone] or [phone]

## 2017-11-22 NOTE — Telephone Encounter (Signed)
I left patient's wife a voicemail to call the office back. PEC - okay to provide patient's wife with the phone number to [facility] [phone]) they offer things such as coming to stay with patient while she goes to run errands.

## 2017-11-23 NOTE — Telephone Encounter (Signed)
I called and spoke with patient's wife. She was unable to write the phone number down, so I called her home phone and left the phone number for her. Nothing further needed.

## 2017-12-07 ENCOUNTER — Other Ambulatory Visit: Payer: Self-pay | Admitting: Family Medicine

## 2017-12-07 DIAGNOSIS — E118 Type 2 diabetes mellitus with unspecified complications: Secondary | ICD-10-CM

## 2017-12-07 DIAGNOSIS — Z794 Long term (current) use of insulin: Principal | ICD-10-CM

## 2017-12-07 DIAGNOSIS — Z23 Encounter for immunization: Secondary | ICD-10-CM | POA: Diagnosis not present

## 2018-01-04 ENCOUNTER — Other Ambulatory Visit: Payer: Self-pay

## 2018-01-04 ENCOUNTER — Encounter (HOSPITAL_COMMUNITY): Payer: Self-pay | Admitting: Radiology

## 2018-01-04 ENCOUNTER — Emergency Department (HOSPITAL_COMMUNITY): Payer: Medicare Other

## 2018-01-04 ENCOUNTER — Observation Stay (HOSPITAL_COMMUNITY)
Admission: EM | Admit: 2018-01-04 | Discharge: 2018-01-05 | Disposition: A | Discharge status: Home / Self Care | Payer: Medicare Other | Attending: Internal Medicine | Admitting: Internal Medicine

## 2018-01-04 ENCOUNTER — Ambulatory Visit: Payer: Medicare Other | Admitting: Podiatry

## 2018-01-04 ENCOUNTER — Observation Stay (HOSPITAL_COMMUNITY): Payer: Medicare Other

## 2018-01-04 DIAGNOSIS — R55 Syncope and collapse: Secondary | ICD-10-CM

## 2018-01-04 DIAGNOSIS — N179 Acute kidney failure, unspecified: Secondary | ICD-10-CM | POA: Diagnosis not present

## 2018-01-04 DIAGNOSIS — E118 Type 2 diabetes mellitus with unspecified complications: Secondary | ICD-10-CM | POA: Diagnosis not present

## 2018-01-04 DIAGNOSIS — Z7984 Long term (current) use of oral hypoglycemic drugs: Secondary | ICD-10-CM | POA: Diagnosis not present

## 2018-01-04 DIAGNOSIS — R000 Tachycardia, unspecified: Secondary | ICD-10-CM | POA: Diagnosis not present

## 2018-01-04 DIAGNOSIS — E785 Hyperlipidemia, unspecified: Secondary | ICD-10-CM | POA: Diagnosis not present

## 2018-01-04 DIAGNOSIS — R2681 Unsteadiness on feet: Secondary | ICD-10-CM | POA: Insufficient documentation

## 2018-01-04 DIAGNOSIS — R41841 Cognitive communication deficit: Secondary | ICD-10-CM | POA: Diagnosis not present

## 2018-01-04 DIAGNOSIS — D696 Thrombocytopenia, unspecified: Secondary | ICD-10-CM

## 2018-01-04 DIAGNOSIS — E119 Type 2 diabetes mellitus without complications: Secondary | ICD-10-CM | POA: Insufficient documentation

## 2018-01-04 DIAGNOSIS — R42 Dizziness and giddiness: Secondary | ICD-10-CM

## 2018-01-04 DIAGNOSIS — Z794 Long term (current) use of insulin: Secondary | ICD-10-CM

## 2018-01-04 DIAGNOSIS — R0902 Hypoxemia: Secondary | ICD-10-CM | POA: Diagnosis not present

## 2018-01-04 DIAGNOSIS — I639 Cerebral infarction, unspecified: Secondary | ICD-10-CM | POA: Diagnosis not present

## 2018-01-04 LAB — CBC WITH DIFFERENTIAL/PLATELET
ABS IMMATURE GRANULOCYTES: 0.02 10*3/uL (ref 0.00–0.07)
Basophils Absolute: 0.1 10*3/uL (ref 0.0–0.1)
Basophils Relative: 1 %
Eosinophils Absolute: 0.4 10*3/uL (ref 0.0–0.5)
Eosinophils Relative: 6 %
HEMATOCRIT: 41.4 % (ref 39.0–52.0)
Hemoglobin: 13.3 g/dL (ref 13.0–17.0)
IMMATURE GRANULOCYTES: 0 %
LYMPHS ABS: 1.2 10*3/uL (ref 0.7–4.0)
Lymphocytes Relative: 18 %
MCH: 32.2 pg (ref 26.0–34.0)
MCHC: 32.1 g/dL (ref 30.0–36.0)
MCV: 100.2 fL — AB (ref 80.0–100.0)
MONOS PCT: 5 %
Monocytes Absolute: 0.4 10*3/uL (ref 0.1–1.0)
Neutro Abs: 4.9 10*3/uL (ref 1.7–7.7)
Neutrophils Relative %: 70 %
Platelets: 146 10*3/uL — ABNORMAL LOW (ref 150–400)
RBC: 4.13 MIL/uL — ABNORMAL LOW (ref 4.22–5.81)
RDW: 13.1 % (ref 11.5–15.5)
WBC: 7 10*3/uL (ref 4.0–10.5)
nRBC: 0 % (ref 0.0–0.2)

## 2018-01-04 LAB — COMPREHENSIVE METABOLIC PANEL
ALT: 38 U/L (ref 0–44)
AST: 31 U/L (ref 15–41)
Albumin: 3.3 g/dL — ABNORMAL LOW (ref 3.5–5.0)
Alkaline Phosphatase: 54 U/L (ref 38–126)
Anion gap: 8 (ref 5–15)
BILIRUBIN TOTAL: 0.6 mg/dL (ref 0.3–1.2)
BUN: 22 mg/dL (ref 8–23)
CALCIUM: 9.2 mg/dL (ref 8.9–10.3)
CO2: 23 mmol/L (ref 22–32)
CREATININE: 1.41 mg/dL — AB (ref 0.61–1.24)
Chloride: 109 mmol/L (ref 98–111)
GFR calc Af Amer: 52 mL/min — ABNORMAL LOW (ref >60)
GFR calc non Af Amer: 45 mL/min — ABNORMAL LOW (ref >60)
Glucose, Bld: 220 mg/dL — ABNORMAL HIGH (ref 70–99)
Potassium: 4.2 mmol/L (ref 3.5–5.1)
Sodium: 140 mmol/L (ref 135–145)
TOTAL PROTEIN: 6.3 g/dL — AB (ref 6.5–8.1)

## 2018-01-04 LAB — I-STAT TROPONIN, ED
TROPONIN I, POC: 0 ng/mL (ref 0.00–0.08)
Troponin i, poc: 0 ng/mL (ref 0.00–0.08)

## 2018-01-04 MED ORDER — ASPIRIN EC 81 MG PO TBEC
81.0000 mg | DELAYED_RELEASE_TABLET | Freq: Every day | ORAL | Status: DC
  Administered 2018-01-05: 81 mg via ORAL
  Filled 2018-01-04: qty 1

## 2018-01-04 MED ORDER — INSULIN ASPART 100 UNIT/ML ~~LOC~~ SOLN
0.0000 [IU] | Freq: Three times a day (TID) | SUBCUTANEOUS | Status: DC
  Administered 2018-01-05: 1 [IU] via SUBCUTANEOUS

## 2018-01-04 MED ORDER — CLOPIDOGREL BISULFATE 75 MG PO TABS
75.0000 mg | ORAL_TABLET | Freq: Every day | ORAL | Status: DC
  Administered 2018-01-04 – 2018-01-05 (×2): 75 mg via ORAL
  Filled 2018-01-04 (×2): qty 1

## 2018-01-04 MED ORDER — SODIUM CHLORIDE 0.9 % IV SOLN
INTRAVENOUS | Status: DC
  Administered 2018-01-04 – 2018-01-05 (×2): via INTRAVENOUS

## 2018-01-04 MED ORDER — ACETAMINOPHEN 325 MG PO TABS
650.0000 mg | ORAL_TABLET | ORAL | Status: DC | PRN
  Administered 2018-01-05: 650 mg via ORAL
  Filled 2018-01-04: qty 2

## 2018-01-04 MED ORDER — ACETAMINOPHEN 160 MG/5ML PO SOLN: 650.0000 mg | ORAL | Status: DC | PRN

## 2018-01-04 MED ORDER — SODIUM CHLORIDE 0.9 % IV BOLUS
1000.0000 mL | Freq: Once | INTRAVENOUS | Status: AC
  Administered 2018-01-04: 1000 mL via INTRAVENOUS

## 2018-01-04 MED ORDER — ENOXAPARIN SODIUM 40 MG/0.4ML ~~LOC~~ SOLN
40.0000 mg | SUBCUTANEOUS | Status: DC
  Administered 2018-01-04: 40 mg via SUBCUTANEOUS
  Filled 2018-01-04: qty 0.4

## 2018-01-04 MED ORDER — STROKE: EARLY STAGES OF RECOVERY BOOK
Freq: Once | Status: AC
  Administered 2018-01-05: 01:00:00
  Filled 2018-01-04 (×2): qty 1

## 2018-01-04 MED ORDER — ACETAMINOPHEN 650 MG RE SUPP: 650.0000 mg | RECTAL | Status: DC | PRN

## 2018-01-04 NOTE — H&P (Signed)
History and Physical    Jeremiah Mccoy HAL:[id number] DOB: 02/28/1934 DOA: 01/04/2018  PCP: Libby Maw, MD Patient coming from: Home  Chief Complaint: Near syncope/dizziness  HPI: Jeremiah Mccoy is a 84 y.o. 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  Patient states he was walking and all of a sudden felt lightheaded and tired and had to sit down on the curbside.  Neighbors called EMS.  Daughter present at bedside states patient's speech is slow at baseline and he has problems with fine motor skills due to his previous stroke a year ago.  Patient denies having any slurring of speech, motor weakness, or numbness today.  He denies having any chest pain, shortness of breath, palpitations, or headache.  Does mention that 2 days ago he had diarrhea all day but it has now resolved.  Reports having chills at home.  States his appetite is good.  Home medications include Plavix but daughter states that patient is noncompliant with his medications.  ED Course: Vitals stable on arrival.  Blood glucose 220.  Creatinine 1.4 (baseline 1.0).  I-STAT troponin negative.  CT had showing no acute abnormality.  MRI brain showing a small acute white matter infarct in the anterior right frontal lobe.  Patient was seen by neurology Dr. Rory Percy who recommended ruling out cardioembolic etiology and doing a stroke risk factor work-up.  TR[facility] paged to admit.  Review of Systems: As per HPI otherwise 10 point review of systems negative.  Past Medical History:  Diagnosis Date  . Cancer ([place])   . Depression   . Diabetes mellitus without complication ([place])   . Prostate cancer ([place])   . Stroke [facility])     Past Surgical History:  Procedure Laterality Date  . APPENDECTOMY    . CHOLECYSTECTOMY    . PROSTATECTOMY        reports that he has never smoked. He has never used smokeless tobacco. He reports that he does not drink alcohol or use drugs.  Allergies  Allergen Reactions  . Morphine And Related Other (See Comments)  . Motrin [Ibuprofen] Other (See Comments)    Dry heaves  . Sulfa Antibiotics Other (See Comments)    Dry heaves    Family History  Problem Relation Age of Onset  . Colon cancer Mother   . Stroke Father   . Diabetes Brother   . Breast cancer Neg Hx   . Prostate cancer Neg Hx   . Pancreatic cancer Neg Hx     Prior to Admission medications   Medication Sig Start Date End Date Taking? Authorizing Provider  metFORMIN (GLUCOPHAGE) 500 MG tablet TAKE 1 TABLET BY MOUTH TWICE DAILY WITH MEAL Patient taking differently: Take 500 mg by mouth daily with breakfast.  12/08/17  Yes Libby Maw, MD  clopidogrel (PLAVIX) 75 MG tablet Take 1 tablet (75 mg total) by mouth daily. Patient not taking: Reported on 01/04/2018 05/18/17   Libby Maw, MD  ranitidine (ZANTAC) 300 MG tablet Take 1 tablet (300 mg total) by mouth at bedtime. Patient not taking: Reported on 01/04/2018 06/22/17   Libby Maw, MD    Physical Exam: Vitals:   01/04/18 2030 01/04/18 2101 01/04/18 2139 01/04/18 2340  BP: (!) 141/69 (!) 143/76 138/77 134/70  Pulse: 67 66 69 63  Resp: 11 20 20 20   Temp:  98.2 F (  36.8 C)  97.6 F (36.4 C)  TempSrc:  Oral  Oral  SpO2: 97% 98% 97% 95%  Weight:  102.2 kg    Height:  5\' 11"  (1.803 m)      Physical Exam  Constitutional: He appears well-developed and well-nourished. No distress.  Sitting up in a hospital stretcher eating dinner  HENT:  Head: Normocephalic and atraumatic.  Mouth/Throat: Oropharynx is clear and moist.  Eyes: Pupils are equal, round, and reactive to light. EOM are normal. Right eye exhibits no discharge. Left eye exhibits no discharge.  Neck: Neck supple. No tracheal deviation present.  Cardiovascular: Normal rate, regular  rhythm and intact distal pulses.  Pulmonary/Chest: Effort normal and breath sounds normal. No respiratory distress. He has no wheezes. He has no rales.  Abdominal: Soft. Bowel sounds are normal. He exhibits no distension. There is no tenderness.  Musculoskeletal: He exhibits no edema.  Neurological: He is alert. No cranial nerve deficit. Coordination normal.  Oriented to self and situation (baseline per daughter).  Answering questions appropriately and has no problem recalling events. Strength 5 out of 5 in bilateral upper and lower extremities.  Skin: Skin is warm and dry.  Psychiatric: He has a normal mood and affect. His behavior is normal.     Labs on Admission: I have personally reviewed following labs and imaging studies  CBC: Recent Labs  Lab 01/04/18 1210  WBC 7.0  NEUTROABS 4.9  HGB 13.3  HCT 41.4  MCV 100.2*  PLT 765*   Basic Metabolic Panel: Recent Labs  Lab 01/04/18 1210  NA 140  K 4.2  CL 109  CO2 23  GLUCOSE 220*  BUN 22  CREATININE 1.41*  CALCIUM 9.2   GFR: Estimated Creatinine Clearance: 49.2 mL/min (A) (by C-G formula based on SCr of 1.41 mg/dL (H)). Liver Function Tests: Recent Labs  Lab 01/04/18 1210  AST 31  ALT 38  ALKPHOS 54  BILITOT 0.6  PROT 6.3*  ALBUMIN 3.3*   No results for input(s): LIPASE, AMYLASE in the last 168 hours. No results for input(s): AMMONIA in the last 168 hours. Coagulation Profile: No results for input(s): INR, PROTIME in the last 168 hours. Cardiac Enzymes: No results for input(s): CKTOTAL, CKMB, CKMBINDEX, TROPONINI in the last 168 hours. BNP (last 3 results) No results for input(s): PROBNP in the last 8760 hours. HbA1C: No results for input(s): HGBA1C in the last 72 hours. CBG: No results for input(s): GLUCAP in the last 168 hours. Lipid Profile: No results for input(s): CHOL, HDL, LDLCALC, TRIG, CHOLHDL, LDLDIRECT in the last 72 hours. Thyroid Function Tests: No results for input(s): TSH, T4TOTAL, FREET4,  T3FREE, THYROIDAB in the last 72 hours. Anemia Panel: No results for input(s): VITAMINB12, FOLATE, FERRITIN, TIBC, IRON, RETICCTPCT in the last 72 hours. Urine analysis:    Component Value Date/Time   COLORURINE YELLOW 03/16/2017 [place] 03/16/2017 1353   LABSPEC 1.020 03/16/2017 1353   PHURINE 5.5 03/16/2017 1353   GLUCOSEU NEGATIVE 03/16/2017 1353   HGBUR NEGATIVE 03/16/2017 1353   BILIRUBINUR NEGATIVE 03/16/2017 1353   KETONESUR NEGATIVE 03/16/2017 1353   UROBILINOGEN 0.2 03/16/2017 1353   NITRITE NEGATIVE 03/16/2017 1353   LEUKOCYTESUR NEGATIVE 03/16/2017 1353    Radiological Exams on Admission: Ct Head Wo Contrast  Result Date: 01/04/2018 CLINICAL DATA:  Acute onset dizziness today. EXAM: CT HEAD WITHOUT CONTRAST TECHNIQUE: Contiguous axial images were obtained from the base of the skull through the vertex without intravenous contrast. COMPARISON:  None. FINDINGS:  Brain: No evidence of acute infarction, hemorrhage, hydrocephalus, extra-axial collection or mass lesion/mass effect. The brain is atrophic with chronic microvascular ischemic change and remote right parietal and PCA territory infarcts. Small, remote left parietal subcortical infarct is also seen. There is also a small lacunar infarction in right cerebellar hemisphere. Vascular: No hyperdense vessel or unexpected calcification. Skull: Normal. Negative for fracture or focal lesion. Sinuses/Orbits: Negative. Other: None. IMPRESSION: No acute abnormality. Atrophy, chronic microvascular ischemic change and remote infarcts as described above. Electronically Signed   By: Inge Rise M.D.   On: 01/04/2018 13:13   [name] Head Wo Contrast  Result Date: 01/04/2018 CLINICAL DATA:  Initial evaluation for acute stroke. EXAM: MRA HEAD WITHOUT CONTRAST MRA NECK WITHOUT CONTRAST TECHNIQUE: Angiographic images of the Circle of Willis were obtained using MRA technique without intravenous contrast. Angiographic images  of the neck were obtained using MRA technique without intravenous contrast. Carotid stenosis measurements (when applicable) are obtained utilizing NASCET criteria, using the distal internal carotid diameter as the denominator. COMPARISON:  Limited brain MRI performed earlier the same day. FINDINGS: MRA HEAD FINDINGS Distal cervical segments of the internal carotid arteries are patent with antegrade flow. Petrous, cavernous, and supraclinoid segments patent without hemodynamically significant stenosis. Origin of the ophthalmic arteries patent bilaterally. ICA termini widely patent. A1 segments, anterior communicating artery common anterior cerebral arteries widely patent and normal. No M1 stenosis or occlusion. Normal MCA bifurcations. Distal MCA branches well perfused and symmetric. Mild distal small vessel atheromatous irregularity. Vertebral arteries widely patent to the vertebrobasilar junction without stenosis. Posterior inferior cerebral arteries patent bilaterally. Basilar artery widely patent to its distal aspect. Superior cerebellar and posterior cerebral arteries widely patent bilaterally. Mild distal small vessel atheromatous irregularity. No aneurysm. MRA NECK FINDINGS Examination technically limited by lack of IV contrast. Aortic arch and origin of the great vessels not well assessed on this examination. Visualized right common carotid artery widely patent to the bifurcation without stenosis. Mild atheromatous narrowing about the proximal right ICA without significant stenosis. Right ICA otherwise widely patent to the skull base without flow-limiting stenosis or occlusion. Visualized left common carotid artery widely patent to the bifurcation. Mild smooth atheromatous narrowing at the origin of the left ICA. Left ICA otherwise widely patent to the skull base without stenosis or occlusion. Origin of the vertebral arteries not well assessed on this exam. Vertebral arteries otherwise widely patent to the  vertebrobasilar junction without stenosis or occlusion. IMPRESSION: MRA HEAD IMPRESSION: 1. Negative intracranial MRA. No large vessel occlusion. No hemodynamically significant or correctable stenosis. 2. Mild distal small vessel atheromatous irregularity. MRA NECK IMPRESSION: Negative MRA of the neck. No vascular occlusion or hemodynamically significant stenosis identified. Electronically Signed   By: Jeannine Boga M.D.   On: 01/04/2018 22:21   [name] Neck Wo Contrast  Result Date: 01/04/2018 CLINICAL DATA:  Initial evaluation for acute stroke. EXAM: MRA HEAD WITHOUT CONTRAST MRA NECK WITHOUT CONTRAST TECHNIQUE: Angiographic images of the Circle of Willis were obtained using MRA technique without intravenous contrast. Angiographic images of the neck were obtained using MRA technique without intravenous contrast. Carotid stenosis measurements (when applicable) are obtained utilizing NASCET criteria, using the distal internal carotid diameter as the denominator. COMPARISON:  Limited brain MRI performed earlier the same day. FINDINGS: MRA HEAD FINDINGS Distal cervical segments of the internal carotid arteries are patent with antegrade flow. Petrous, cavernous, and supraclinoid segments patent without hemodynamically significant stenosis. Origin of the ophthalmic arteries patent bilaterally. ICA termini widely patent. A1  segments, anterior communicating artery common anterior cerebral arteries widely patent and normal. No M1 stenosis or occlusion. Normal MCA bifurcations. Distal MCA branches well perfused and symmetric. Mild distal small vessel atheromatous irregularity. Vertebral arteries widely patent to the vertebrobasilar junction without stenosis. Posterior inferior cerebral arteries patent bilaterally. Basilar artery widely patent to its distal aspect. Superior cerebellar and posterior cerebral arteries widely patent bilaterally. Mild distal small vessel atheromatous irregularity. No aneurysm. MRA  NECK FINDINGS Examination technically limited by lack of IV contrast. Aortic arch and origin of the great vessels not well assessed on this examination. Visualized right common carotid artery widely patent to the bifurcation without stenosis. Mild atheromatous narrowing about the proximal right ICA without significant stenosis. Right ICA otherwise widely patent to the skull base without flow-limiting stenosis or occlusion. Visualized left common carotid artery widely patent to the bifurcation. Mild smooth atheromatous narrowing at the origin of the left ICA. Left ICA otherwise widely patent to the skull base without stenosis or occlusion. Origin of the vertebral arteries not well assessed on this exam. Vertebral arteries otherwise widely patent to the vertebrobasilar junction without stenosis or occlusion. IMPRESSION: MRA HEAD IMPRESSION: 1. Negative intracranial MRA. No large vessel occlusion. No hemodynamically significant or correctable stenosis. 2. Mild distal small vessel atheromatous irregularity. MRA NECK IMPRESSION: Negative MRA of the neck. No vascular occlusion or hemodynamically significant stenosis identified. Electronically Signed   By: Jeannine Boga M.D.   On: 01/04/2018 22:21   Mr Brain Wo Contrast  Result Date: 01/04/2018 CLINICAL DATA:  84 year old male with altered mental status, dizziness, near-syncope. EXAM: MRI HEAD WITHOUT CONTRAST TECHNIQUE: Multiplanar, multiecho pulse sequences of the brain and surrounding structures were obtained without intravenous contrast. COMPARISON:  Head CT 1252 hours today. FINDINGS: The examination was discontinued prior to completion by patient request. Only axial diffusion weighted imaging was obtained. There is a small 2-3 millimeter focus of restricted diffusion in the right anterior frontal lobe subcortical white matter on series 3, image 34. No other restricted diffusion or acute infarction. Chronic encephalomalacia in the left superior frontal  gyrus, bilateral parietal lobes, right occipital pole, and posterior inferior left temporal lobe. These areas demonstrate facilitated diffusion. Evidence of small chronic right cerebellar infarcts was more apparent by CT. Stable ventricle size and configuration. No intracranial mass effect. IMPRESSION: 1. Positive for a small acute white matter infarct in the anterior right frontal lobe. 2. The examination was discontinued prior to completion by patient request and only axial diffusion weighted imaging was obtained. 3. Scattered chronic cortical infarcts in both hemispheres with encephalomalacia. Electronically Signed   By: Genevie Ann M.D.   On: 01/04/2018 16:11    EKG: Independently reviewed.  Sinus/ectopic atrial rhythm.  Not suggestive of ACS.  Assessment/Plan Principal Problem:   Acute CVA (cerebrovascular accident) ([place]) Active Problems:   Type 2 diabetes mellitus with complication, with long-term current use of insulin ([facility])   Thrombocytopenia ([facility])   AKI (acute kidney injury) ([place])  Acute ischemic stroke of the anterior right frontal lobe -Symptoms included lightheadedness and near syncope -MRI brain showing a small acute white matter infarct in the anterior right frontal lobe.  Patient was seen by neurology Dr. Rory Percy who recommended ruling out cardioembolic etiology and doing a stroke risk factor work-up. -Telemetry -Aspirin and Plavix -Echo -Fasting lipid panel -A1c -Frequent neurochecks -MRA of head and neck without contrast and lower extremity Dopplers per neurology recommendation -PT, OT, and SLP -Check orthostatics -Permissive hypertension up to 220/120 -Ongoing short-term memory loss:  Patient will need outpatient neurology follow-up. Check TSH, B12, and RPR per neurology recommendation  Acute kidney injury -Creatinine 1.4 (baseline 1.0).  Likely prerenal due to dehydration. -Fluid resuscitation -Repeat BMP in a.m.  Borderline thrombocytopenia Platelets 146,000; previously  normal. -Repeat CBC in a.m. to confirm  Type 2 diabetes -Blood glucose 220 on arrival. -A1c pending -Sliding scale insulin sensitive -CBG checks  DVT prophylaxis: Lovenox Code Status: Patient wishes to be full code. Family Communication: Daughters at bedside updated. Disposition Plan: Anticipate discharge in 1 to 2 days to home. Consults called: Neurology (Dr. Rory Percy) Admission status: Observation   Shela Leff MD [facility] Pager [phone]  If 7PM-7AM, please contact night-coverage [email or web address] Password [email or web address]  01/05/2018, 1:16 AM

## 2018-01-04 NOTE — ED Notes (Signed)
Ordered diet tray for pt per rn

## 2018-01-04 NOTE — ED Triage Notes (Signed)
Patient was out walking and felt dry mouth, diaphoretic and sat down because he felt dizzy. Stated he could not stand back up.

## 2018-01-04 NOTE — ED Notes (Signed)
Patient transported to MRI 

## 2018-01-04 NOTE — ED Provider Notes (Signed)
  Physical Exam  BP 133/74   Pulse 66   Temp 98 F (36.7 C) (Oral)   Resp 20   Ht 5\' 11"  (1.803 m)   Wt 102.1 kg   SpO2 98%   BMI 31.38 kg/m   Physical Exam  ED Course/Procedures     Procedures  MDM   Assuming care of patient from Dr. Darl Householder   Patient in the ED for near syncope event / dizziness. He has  Hx of TIA/Stroke and DM. Workup thus far shows no concerning findings.  Important pending results are MRI to r/o stroke/  According to Dr. Darl Householder, plan is to d/c if the MRI is neg.   Patient had no complains, no concerns from the nursing side. Will continue to monitor.       Varney Biles, MD 01/04/18 1556

## 2018-01-04 NOTE — ED Provider Notes (Signed)
[facility] EMERGENCY DEPARTMENT Provider Note   CSN: [id number] Arrival date & time: 01/04/18  1132     History   Chief Complaint Chief Complaint  Patient presents with  . Near Syncope    HPI Jeremiah Mccoy is a 84 y.o. male hx of depression, DM, previous stroke here with dizziness, diaphoresis.  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  He states that he has sudden onset of dizziness.  He states that he was lightheaded and felt like he was going to pass out.  He states that he had a hard time getting up so had to lay down but did not hit his head.  Patient states that he has previous history of mini stroke back in [place].  He is not compliant with his Plavix.  Per patient and his wife, patient has baseline slurred speech that is unchanged. No chest pain or shortness of breath.   The history is provided by the patient.    Past Medical History:  Diagnosis Date  . Cancer ([place])   . Depression   . Diabetes mellitus without complication ([place])   . Prostate cancer ([place])   . Stroke [facility])     Patient Active Problem List   Diagnosis Date Noted  . Recurrent prostate cancer ([place]) 10/24/2017  . Traumatic ecchymosis of abdominal wall 10/19/2017  . Macrocytosis without anemia 07/17/2017  . Macrocytosis 07/17/2017  . GERD (gastroesophageal reflux disease) 06/28/2017  . Chest pain 06/22/2017  . Elevated PSA, less than 10 ng/ml 04/17/2017  . Type 2 diabetes mellitus with complication, with long-term current use of insulin ([place]) 03/15/2017  . Elevated cholesterol 03/15/2017  . History of stroke 03/15/2017  . Non-compliance 03/15/2017  . History of prostate cancer 03/15/2017  . Continuous leakage of urine 03/15/2017  . Erectile dysfunction following prostate ablative therapy 03/15/2017    Past Surgical History:  Procedure Laterality Date  . APPENDECTOMY    . CHOLECYSTECTOMY    . PROSTATECTOMY          Home  Medications    Prior to Admission medications   Medication Sig Start Date End Date Taking? Authorizing Provider  metFORMIN (GLUCOPHAGE) 500 MG tablet TAKE 1 TABLET BY MOUTH TWICE DAILY WITH MEAL Patient taking differently: Take 500 mg by mouth daily with breakfast.  12/08/17  Yes Libby Maw, MD  clopidogrel (PLAVIX) 75 MG tablet Take 1 tablet (75 mg total) by mouth daily. Patient not taking: Reported on 01/04/2018 05/18/17   Libby Maw, MD  ranitidine (ZANTAC) 300 MG tablet Take 1 tablet (300 mg total) by mouth at bedtime. Patient not taking: Reported on 01/04/2018 06/22/17   Libby Maw, MD    Family History Family History  Problem Relation Age of Onset  . Colon cancer Mother   . Stroke Father   . Diabetes Brother   . Breast cancer Neg Hx   . Prostate cancer Neg Hx   . Pancreatic cancer Neg Hx     Social History Social History   Tobacco Use  . Smoking status: Never Smoker  . Smokeless tobacco: Never Used  Substance Use Topics  . Alcohol use: No    Frequency: Never  . Drug use: No     Allergies   Morphine and related; Motrin [ibuprofen]; and Sulfa antibiotics   Review of Systems Review of Systems  Cardiovascular: Positive for near-syncope.  Neurological: Positive for dizziness and light-headedness.  All other systems reviewed and  are negative.    Physical Exam Updated Vital Signs BP 133/74   Pulse 66   Temp 98 F (36.7 C) (Oral)   Resp 20   Ht 5\' 11"  (1.803 m)   Wt 102.1 kg   SpO2 98%   BMI 31.38 kg/m   Physical Exam  Constitutional: He is oriented to person, place, and time. He appears well-developed and well-nourished.  Well appearing for age   HENT:  Head: Normocephalic.  Mouth/Throat: Oropharynx is clear and moist.  Eyes: Pupils are equal, round, and reactive to light. Conjunctivae and EOM are normal.  Neck: Normal range of motion. Neck supple.  Cardiovascular: Normal rate, regular rhythm and normal heart sounds.   Pulmonary/Chest: Effort normal and breath sounds normal. No stridor. No respiratory distress.  Abdominal: Soft. Bowel sounds are normal. He exhibits no distension. There is no tenderness.  Musculoskeletal: Normal range of motion.  Neurological: He is alert and oriented to person, place, and time. No cranial nerve deficit. Coordination normal.  CN 2- 12 intact. Nl strength throughout. Nl finger to nose. Able to ambulate with cane (baseline)   Skin: Skin is warm. Capillary refill takes less than 2 seconds.  Psychiatric: He has a normal mood and affect.  Nursing note and vitals reviewed.    ED Treatments / Results  Labs (all labs ordered are listed, but only abnormal results are displayed) Labs Reviewed  CBC WITH DIFFERENTIAL/PLATELET - Abnormal; Notable for the following components:      Result Value   RBC 4.13 (*)    MCV 100.2 (*)    Platelets 146 (*)    All other components within normal limits  COMPREHENSIVE METABOLIC PANEL - Abnormal; Notable for the following components:   Glucose, Bld 220 (*)    Creatinine, Ser 1.41 (*)    Total Protein 6.3 (*)    Albumin 3.3 (*)    GFR calc non Af Amer 45 (*)    GFR calc Af Amer 52 (*)    All other components within normal limits  I-STAT TROPONIN, ED  I-STAT TROPONIN, ED    EKG EKG Interpretation  Date/Time:  Thursday January 04 2018 12:07:51 EDT Ventricular Rate:  78 PR Interval:    QRS Duration: 110 QT Interval:  371 QTC Calculation: 423 R Axis:   19 Text Interpretation:  Sinus or ectopic atrial rhythm Short PR interval Low voltage, extremity and precordial leads No previous ECGs available Confirmed by Wandra Arthurs [phone]) on 01/04/2018 12:26:28 PM   Radiology Ct Head Wo Contrast  Result Date: 01/04/2018 CLINICAL DATA:  Acute onset dizziness today. EXAM: CT HEAD WITHOUT CONTRAST TECHNIQUE: Contiguous axial images were obtained from the base of the skull through the vertex without intravenous contrast. COMPARISON:  None.  FINDINGS: Brain: No evidence of acute infarction, hemorrhage, hydrocephalus, extra-axial collection or mass lesion/mass effect. The brain is atrophic with chronic microvascular ischemic change and remote right parietal and PCA territory infarcts. Small, remote left parietal subcortical infarct is also seen. There is also a small lacunar infarction in right cerebellar hemisphere. Vascular: No hyperdense vessel or unexpected calcification. Skull: Normal. Negative for fracture or focal lesion. Sinuses/Orbits: Negative. Other: None. IMPRESSION: No acute abnormality. Atrophy, chronic microvascular ischemic change and remote infarcts as described above. Electronically Signed   By: Inge Rise M.D.   On: 01/04/2018 13:13    Procedures Procedures (including critical care time)  Medications Ordered in ED Medications  sodium chloride 0.9 % bolus 1,000 mL (1,000 mLs Intravenous New Bag/Given  01/04/18 1231)     Initial Impression / Assessment and Plan / ED Course  I have reviewed the triage vital signs and the nursing notes.  Pertinent labs & imaging results that were available during my care of the patient were reviewed by me and considered in my medical decision making (see chart for details).    Jeremiah Mccoy is a 84 y.o. male here with dizziness. Had previous TIA in the past. Nl neuro exam now. He is not compliant with his plavix. Will get labs, MRI brain, Trop x 2.   4:04 PM Trop neg x 1. MRI brain pending. Signed out to Dr. Mariane Masters. If MRI negative and trop neg, anticipate dc home.    Final Clinical Impressions(s) / ED Diagnoses   Final diagnoses:  None    ED Discharge Orders    None       Drenda Freeze, MD 01/04/18 1606

## 2018-01-04 NOTE — Consult Note (Addendum)
Neurology Consultation  Reason for Consult: Stroke Referring Physician: Dr. Kathrynn Humble  CC: Lightheadedness, near-syncope  History is obtained from: Patient, wife  HPI: Jeremiah Mccoy is a 84 y.o. male past medical history of hypertension, diabetes, prostate cancer, stroke in 2018 with residual right sided paresthesias and aphasia for which she was hospitalized in [place] and since has moved to [place], was in usual state of health this morning around 11 AM when he was taking a walk and had a near syncopal episode and was brought in for evaluation. According to him and his wife, he usually is not very ambulatory and goes from bed to chair and chair to bed and does not walk around much but is a decided to take a walk.  As he was walking he became lightheaded and felt like he is going to pass out and lowered himself to the ground.  No head injury. He denies any acute speech changes at the time or feelings of room spinning. Denies any double vision at that time. He has had a stroke in 2018, while in [place], when he had difficulty using his right hand and his speech became garbled- best guess left MCA infarct, from which she recovered reasonably well with some residual tingling numbness in the right hand and occasional word finding difficulties that are off and on according to his wife. He is also non-compliant with his medications.  He was prescribed insulin at the [facility] while they were in [place] or [place] but he refuses to take that.  His primary doctor has prescribed him metformin, which he has questionable compliance with.  He also is not on any antiplatelets -he was prescribed Plavix after his last stroke which \\hhe  is not taking. Denies any current chest pain nausea vomiting shortness of breath. Wife reports significant short-term memory impairment that has been ongoing for a while. He also has history of prostate cancer and recurrence of that cancer right now for which  she is being treated at the cancer center.  LKW: 11 AM on 01/04/2018 tpa given?: no, presentation was consistent with syncope, stroke on MRI might be incidental Premorbid modified Rankin scale (mRS): 2  ROS: ROS was performed and is negative except as noted in the HPI.   Past Medical History:  Diagnosis Date  . Cancer ([place])   . Depression   . Diabetes mellitus without complication ([place])   . Prostate cancer ([place])   . Stroke [facility])    Family History  Problem Relation Age of Onset  . Colon cancer Mother   . Stroke Father   . Diabetes Brother   . Breast cancer Neg Hx   . Prostate cancer Neg Hx   . Pancreatic cancer Neg Hx    Social History:   reports that he has never smoked. He has never used smokeless tobacco. He reports that he does not drink alcohol or use drugs.  Denies tobacco alcohol or illicit drug use. He is a Solicitor and a retired traffic cop. Lives in [place] with his wife in an apartment.  Medications No current facility-administered medications for this encounter.   Current Outpatient Medications:  .  metFORMIN (GLUCOPHAGE) 500 MG tablet, TAKE 1 TABLET BY MOUTH TWICE DAILY WITH MEAL (Patient taking differently: Take 500 mg by mouth daily with breakfast. ), Disp: 60 tablet, Rfl: 0 .  clopidogrel (PLAVIX) 75 MG tablet, Take 1 tablet (75 mg total) by mouth daily. (Patient not taking: Reported on 01/04/2018), Disp: 90 tablet, Rfl: 1 .  ranitidine (ZANTAC) 300 MG tablet, Take 1 tablet (300 mg total) by mouth at bedtime. (Patient not taking: Reported on 01/04/2018), Disp: 30 tablet, Rfl: 1   Exam: Current vital signs: BP (!) 161/81   Pulse 74   Temp 98 F (36.7 C) (Oral)   Resp (!) 23   Ht 5\' 11"  (1.803 m)   Wt 102.1 kg   SpO2 97%   BMI 31.38 kg/m  Vital signs in last 24 hours: Temp:  [98 F (36.7 C)] 98 F (36.7 C) (10/10 1150) Pulse Rate:  [61-75] 74 (10/10 1445) Resp:  [10-23] 23 (10/10 1445) BP: (111-161)/(66-100) 161/81 (10/10 1445) SpO2:   [97 %-100 %] 97 % (10/10 1445) Weight:  [102.1 kg] 102.1 kg (10/10 1135) GENERAL: Awake, alert in NAD HEENT: - Normocephalic and atraumatic, dry mm, no LN++, no Thyromegally LUNGS - Clear to auscultation bilaterally with no wheezes CV - S1S2 RRR, no m/r/g, equal pulses bilaterally. ABDOMEN - Soft, nontender, nondistended with normoactive BS Ext: warm, well perfused, intact peripheral pulses, no edema  NEURO:  Mental Status: Awake, alert, oriented to self, place-hospital, and month.  Was unable to name the president-that President Obama and in no unpleasant descriptor for the current president but no name-he said he forgot who the current president was.  He could not tell me the name of the hospital but according to his wife he would not know that because they are not from [place] and have recently moved here. Language: speech is non-dysarthric.  Naming, repetition, fluency, and comprehension intact. Cranial Nerves: PERRL. EOMI, visual fields full, no facial asymmetry, facial sensation intact, hearing intact, tongue/uvula/soft palate midline, normal sternocleidomastoid and trapezius muscle strength. No evidence of tongue atrophy or fibrillations Motor: Symmetric antigravity in all 4 with no drift Tone: is normal and bulk is normal Sensation- Intact to light touch bilaterally with no extinction Coordination: FTN intact bilaterally, no ataxia in BLE. Gait- deferred  NIHSS-0   Labs I have reviewed labs in epic and the results pertinent to this consultation are: Creatinine 1.41, GFR 45, glucose 220 CBC    Component Value Date/Time   WBC 7.0 01/04/2018 1210   RBC 4.13 (L) 01/04/2018 1210   HGB 13.3 01/04/2018 1210   HCT 41.4 01/04/2018 1210   PLT 146 (L) 01/04/2018 1210   MCV 100.2 (H) 01/04/2018 1210   MCH 32.2 01/04/2018 1210   MCHC 32.1 01/04/2018 1210   RDW 13.1 01/04/2018 1210   LYMPHSABS 1.2 01/04/2018 1210   MONOABS 0.4 01/04/2018 1210   EOSABS 0.4 01/04/2018 1210    BASOSABS 0.1 01/04/2018 1210  CMP     Component Value Date/Time   NA 140 01/04/2018 1210   K 4.2 01/04/2018 1210   CL 109 01/04/2018 1210   CO2 23 01/04/2018 1210   GLUCOSE 220 (H) 01/04/2018 1210   BUN 22 01/04/2018 1210   CREATININE 1.41 (H) 01/04/2018 1210   CALCIUM 9.2 01/04/2018 1210   PROT 6.3 (L) 01/04/2018 1210   ALBUMIN 3.3 (L) 01/04/2018 1210   AST 31 01/04/2018 1210   ALT 38 01/04/2018 1210   ALKPHOS 54 01/04/2018 1210   BILITOT 0.6 01/04/2018 1210   GFRNONAA 45 (L) 01/04/2018 1210   GFRAA 52 (L) 01/04/2018 1210   Imaging I have reviewed the images obtained: CT-scan of the brain-no acute abnormality.  No bleed MRI examination of the brain-small acute white matter infarct in the anterior right frontal lobe-incomplete MRI study.  Assessment:  84 year old man with past medical history of  recurrent prostate cancer, hypertension, diabetes, stroke in 2018 with residual aphasia/word finding difficulty and intermittent right sided paresthesias presented for evaluation of an episode of near syncope which she described as dizziness. Detailed description of the event was negative for any spinning sensation.  Exam does not reveal any nystagmus or any other abnormality with an NIH stroke scale of 0. Patient's MRI did reveal a punctate stroke in the right frontal lobe, which could be incidental but given the fact that he has had an old stroke, the MRI today shows a stroke and he was lightheaded-cardioembolic etiology should be pursued and a stroke risk factor work-up including telemetry and echo should be performed.  Impression: Acute ischemic stroke-punctate right anterior frontal stroke Evaluate for underlying arrhythmia Evaluate for underlying hypercoagulable state due to the recent prostate cancer  Recommendations: --Hospitalist for observation admission --Telemetry --Echo --Fasting lipid panel --Hemoglobin A1c --Frequent neurochecks --We need to complete the MRI of the  brain.  Might require some sedation.  --MRA of the head and neck without contrast should be be performed to look at the head and neck vasculature as well. --PT OT and speech therapy --N.p.o. unless cleared by bedside swallow evaluation   --Orthostatic vitals -given the history of lightheadedness and near syncope  --Regarding ongoing short-term memory problems, outpatient work-up with outpatient neurology follow-up was recommended. --Can check TSH, B12 and RPR for reversible risk factor work-up for memory.  Stroke team will follow with you.  -- Amie [place], MD [facility] Pager: [phone] If 7pm to 7am, please call on call as listed on AMION.

## 2018-01-05 ENCOUNTER — Encounter (HOSPITAL_COMMUNITY): Payer: Self-pay

## 2018-01-05 ENCOUNTER — Observation Stay (HOSPITAL_BASED_OUTPATIENT_CLINIC_OR_DEPARTMENT_OTHER): Payer: Medicare Other

## 2018-01-05 DIAGNOSIS — I951 Orthostatic hypotension: Secondary | ICD-10-CM | POA: Diagnosis not present

## 2018-01-05 DIAGNOSIS — R413 Other amnesia: Secondary | ICD-10-CM

## 2018-01-05 DIAGNOSIS — N179 Acute kidney failure, unspecified: Secondary | ICD-10-CM

## 2018-01-05 DIAGNOSIS — Z794 Long term (current) use of insulin: Secondary | ICD-10-CM | POA: Diagnosis not present

## 2018-01-05 DIAGNOSIS — I340 Nonrheumatic mitral (valve) insufficiency: Secondary | ICD-10-CM

## 2018-01-05 DIAGNOSIS — E118 Type 2 diabetes mellitus with unspecified complications: Secondary | ICD-10-CM

## 2018-01-05 DIAGNOSIS — I639 Cerebral infarction, unspecified: Secondary | ICD-10-CM | POA: Diagnosis not present

## 2018-01-05 DIAGNOSIS — E785 Hyperlipidemia, unspecified: Secondary | ICD-10-CM | POA: Diagnosis not present

## 2018-01-05 DIAGNOSIS — D696 Thrombocytopenia, unspecified: Secondary | ICD-10-CM

## 2018-01-05 DIAGNOSIS — Z8673 Personal history of transient ischemic attack (TIA), and cerebral infarction without residual deficits: Secondary | ICD-10-CM | POA: Diagnosis not present

## 2018-01-05 LAB — LIPID PANEL
Cholesterol: 178 mg/dL (ref 0–200)
HDL: 38 mg/dL — ABNORMAL LOW (ref >40)
LDL Cholesterol: 112 mg/dL — ABNORMAL HIGH (ref 0–99)
TRIGLYCERIDES: 142 mg/dL (ref <150)
Total CHOL/HDL Ratio: 4.7 RATIO
VLDL: 28 mg/dL (ref 0–40)

## 2018-01-05 LAB — ECHOCARDIOGRAM COMPLETE
Height: 71 in
Weight: 3604.96 oz

## 2018-01-05 LAB — GLUCOSE, CAPILLARY: Glucose-Capillary: 134 mg/dL — ABNORMAL HIGH (ref 70–99)

## 2018-01-05 LAB — HEMOGLOBIN A1C
HEMOGLOBIN A1C: 7.2 % — AB (ref 4.8–5.6)
MEAN PLASMA GLUCOSE: 159.94 mg/dL

## 2018-01-05 LAB — CBC
HCT: 37 % — ABNORMAL LOW (ref 39.0–52.0)
Hemoglobin: 12.4 g/dL — ABNORMAL LOW (ref 13.0–17.0)
MCH: 33.2 pg (ref 26.0–34.0)
MCHC: 33.5 g/dL (ref 30.0–36.0)
MCV: 98.9 fL (ref 80.0–100.0)
NRBC: 0 % (ref 0.0–0.2)
Platelets: 137 10*3/uL — ABNORMAL LOW (ref 150–400)
RBC: 3.74 MIL/uL — ABNORMAL LOW (ref 4.22–5.81)
RDW: 13.2 % (ref 11.5–15.5)
WBC: 8 10*3/uL (ref 4.0–10.5)

## 2018-01-05 LAB — RPR: RPR: NONREACTIVE

## 2018-01-05 LAB — TSH: TSH: 5.474 u[IU]/mL — AB (ref 0.350–4.500)

## 2018-01-05 LAB — BASIC METABOLIC PANEL
ANION GAP: 8 (ref 5–15)
BUN: 25 mg/dL — ABNORMAL HIGH (ref 8–23)
CALCIUM: 8.4 mg/dL — AB (ref 8.9–10.3)
CO2: 23 mmol/L (ref 22–32)
CREATININE: 1.37 mg/dL — AB (ref 0.61–1.24)
Chloride: 110 mmol/L (ref 98–111)
GFR calc non Af Amer: 46 mL/min — ABNORMAL LOW (ref >60)
GFR, EST AFRICAN AMERICAN: 54 mL/min — AB (ref >60)
Glucose, Bld: 129 mg/dL — ABNORMAL HIGH (ref 70–99)
Potassium: 3.7 mmol/L (ref 3.5–5.1)
SODIUM: 141 mmol/L (ref 135–145)

## 2018-01-05 LAB — VITAMIN B12: VITAMIN B 12: 211 pg/mL (ref 180–914)

## 2018-01-05 MED ORDER — ASPIRIN 81 MG PO TBEC: 81.0000 mg | DELAYED_RELEASE_TABLET | Freq: Every day | ORAL | 0 refills | Status: DC

## 2018-01-05 MED ORDER — CYANOCOBALAMIN 1000 MCG PO TABS: 1000.0000 ug | ORAL_TABLET | Freq: Every day | ORAL | 0 refills | Status: AC

## 2018-01-05 MED ORDER — ATORVASTATIN CALCIUM 40 MG PO TABS: 40.0000 mg | ORAL_TABLET | Freq: Every day | ORAL | Status: DC

## 2018-01-05 MED ORDER — CLOPIDOGREL BISULFATE 75 MG PO TABS: 75.0000 mg | ORAL_TABLET | Freq: Every day | ORAL | 0 refills | Status: DC

## 2018-01-05 MED ORDER — CYANOCOBALAMIN 1000 MCG/ML IJ SOLN: 1000.0000 ug | Freq: Once | INTRAMUSCULAR | Status: DC

## 2018-01-05 MED ORDER — VITAMIN B-12 1000 MCG PO TABS: 1000.0000 ug | ORAL_TABLET | Freq: Every day | ORAL | Status: DC

## 2018-01-05 MED ORDER — PERFLUTREN LIPID MICROSPHERE
1.0000 mL | INTRAVENOUS | Status: AC | PRN
  Administered 2018-01-05: 2 mL via INTRAVENOUS
  Filled 2018-01-05: qty 10

## 2018-01-05 MED ORDER — ATORVASTATIN CALCIUM 40 MG PO TABS: 40.0000 mg | ORAL_TABLET | Freq: Every day | ORAL | 0 refills | Status: DC

## 2018-01-05 NOTE — Care Management Note (Signed)
Case Management Note  Patient Details  Name: Jeremiah Mccoy MRN: [id number] Date of Birth: 03/02/1934  Subjective/Objective:   Pt admitted with a stroke. He is from home with his spouse. Pt has: walker, cane, transfer chair and lift chair at home. Pt's spouse denies any issues with transportation and states they dont have issues obtaining his medications but she has trouble getting him to take his meds.     Wife has just set up for [facility] to assist her with care of her spouse at home.              Action/Plan: Pt discharging home with orders for [facility] services. CM provided choice and they selected [facility]. Dorian Pod with [facility] notified and accepted the referral.  Wife states she can provide transport home.  Expected Discharge Date:  01/06/18               Expected Discharge Plan:  [place]  In-House Referral:     Discharge planning Services  CM Consult  Post Acute Care Choice:  Home Health Choice offered to:  Patient  DME Arranged:    DME Agency:     HH Arranged:  PT, OT HH Agency:  [facility]  Status of Service:  Completed, signed off  If discussed at [place] of Stay Meetings, dates discussed:    Additional Comments:  Pollie Friar, RN 01/05/2018, 2:11 PM

## 2018-01-05 NOTE — Progress Notes (Signed)
  Echocardiogram 2D Echocardiogram has been performed with [facility].  Jeremiah Mccoy 01/05/2018, 10:29 AM

## 2018-01-05 NOTE — Progress Notes (Signed)
  Speech Language Pathology  Patient Details Name: Jeremiah Mccoy MRN: [id number] DOB: 04/10/1933 Today's Date: 01/05/2018 Time:  -       Attempted cognitive assessment earlier this am however pt out of room. Will continue efforts.     Orbie Pyo [place].Ed Risk analyst [phone] Office [phone]

## 2018-01-05 NOTE — Discharge Summary (Signed)
Physician Discharge Summary  Jeremiah Mccoy EXB:[id number] DOB: March 17, 1934 DOA: 01/04/2018  PCP: Libby Maw, MD  Admit date: 01/04/2018 Discharge date: 01/05/2018  Admitted From: home Discharge disposition: home   Recommendations for Outpatient Follow-Up:   1. Home health 2. ASA 81 mg daily and Plavix 75 mg daily. Continue DAPT for 3 weeks and then plavix alone 3. Monitor B12 levels 4. encourage compliance with medications 5. TED hose-- encouraged slow movements when changing positions 6. Needs 30 day event monitor to r/o a fib   Discharge Diagnosis:   Principal Problem:   Acute CVA (cerebrovascular accident) [facility]) Active Problems:   Type 2 diabetes mellitus with complication, with long-term current use of insulin (HCC)   Thrombocytopenia (HCC)   AKI (acute kidney injury) ([place])    Discharge Condition: Improved.  Diet recommendation: Low sodium, heart healthy.  Carbohydrate-modified.  Wound care: None.  Code status: Full.   History of Present Illness:   Jeremiah Mccoy is a 84 y.o. 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  Patient states he was walking and all of a sudden felt lightheaded and tired and had to sit down on the curbside.  Neighbors called EMS.  Daughter present at bedside states patient's speech is slow at baseline and he has problems with fine motor skills due to his previous stroke a year ago.  Patient denies having any slurring of speech, motor weakness, or numbness today.  He denies having any chest pain, shortness of breath, palpitations, or headache.  Does mention that 2 days ago he had diarrhea all day but it has now resolved.  Reports having chills at home.  States his appetite is good.  Home medications include Plavix but daughter  states that patient is noncompliant with his medications.   Hospital Course by Problem:   Stroke:  Punctate infarct anterior right frontal lobe - embolic source unknown vs. Small vessel disease  CT head - remote infarcts including right MCA, MCA/PCA, left parietal   MRI head - Positive for a punctate acute white matter infarct in the anterior right frontal lobe.   MRA head H&N - negative  B LE venous dopplers - neg for DVT  2D Echo - EF 60-65%  LDL - 112- on statin  HgbA1c - 7.2  ASA 81 mg daily and Plavix 75 mg daily. Continue DAPT for 3 weeks and then plavix alone  Home health  Orthostatic hypotension  Near syncope or syncope on admission  Orthostatic vital showed significant BP drop on standing  Likely the cause of near syncope  Place TED hose  Slow on standing up with supervision  Treatment as per primary team  Hx of stroke  11/2016 - right arm weakness with dysarthria - consider stroke at OSH - resolved in 3 hr  07/2017 - expressive aphasia - lasting 30min  CT head showed old right MCA, MCA/PCA, and left parietal infarcts  Given the recurrent stroke like episodes and old infarcts on CT/MRI, recommend 30 day cardiac event monitoring to rule out afib as outpt  Pt has cardiology follow up with Dr. Ellyn Hack  Hypertension  Stable  Long-term BP goal normotensive  Hyperlipidemia  LDL 112, goal < 70  Current lipid lowering medication: lipitor 40  Diabetes  HgbA1c 7.2, goal < 7.0  Uncontrolled  Encouraged diabetic diet as well as  medication compliance  Obesity Body mass index is 31.42 kg/m.    Medical Consultants:      Discharge Exam:   Vitals:   01/05/18 0305 01/05/18 0724  BP: 130/64 (!) 147/88  Pulse: 67 (!) 58  Resp: 18 20  Temp: 97.9 F (36.6 C) 98.2 F (36.8 C)  SpO2: 98% 97%   Vitals:   01/04/18 2139 01/04/18 2340 01/05/18 0305 01/05/18 0724  BP: 138/77 134/70 130/64 (!) 147/88  Pulse: 69 63 67 (!) 58  Resp: 20  20 18 20   Temp:  97.6 F (36.4 C) 97.9 F (36.6 C) 98.2 F (36.8 C)  TempSrc:  Oral Oral Oral  SpO2: 97% 95% 98% 97%  Weight:      Height:        General exam: Appears calm and comfortable.    The results of significant diagnostics from this hospitalization (including imaging, microbiology, ancillary and laboratory) are listed below for reference.     Procedures and Diagnostic Studies:   Ct Head Wo Contrast  Result Date: 01/04/2018 CLINICAL DATA:  Acute onset dizziness today. EXAM: CT HEAD WITHOUT CONTRAST TECHNIQUE: Contiguous axial images were obtained from the base of the skull through the vertex without intravenous contrast. COMPARISON:  None. FINDINGS: Brain: No evidence of acute infarction, hemorrhage, hydrocephalus, extra-axial collection or mass lesion/mass effect. The brain is atrophic with chronic microvascular ischemic change and remote right parietal and PCA territory infarcts. Small, remote left parietal subcortical infarct is also seen. There is also a small lacunar infarction in right cerebellar hemisphere. Vascular: No hyperdense vessel or unexpected calcification. Skull: Normal. Negative for fracture or focal lesion. Sinuses/Orbits: Negative. Other: None. IMPRESSION: No acute abnormality. Atrophy, chronic microvascular ischemic change and remote infarcts as described above. Electronically Signed   By: Inge Rise M.D.   On: 01/04/2018 13:13   [name] Head Wo Contrast  Result Date: 01/04/2018 CLINICAL DATA:  Initial evaluation for acute stroke. EXAM: MRA HEAD WITHOUT CONTRAST MRA NECK WITHOUT CONTRAST TECHNIQUE: Angiographic images of the Circle of Willis were obtained using MRA technique without intravenous contrast. Angiographic images of the neck were obtained using MRA technique without intravenous contrast. Carotid stenosis measurements (when applicable) are obtained utilizing NASCET criteria, using the distal internal carotid diameter as the denominator.  COMPARISON:  Limited brain MRI performed earlier the same day. FINDINGS: MRA HEAD FINDINGS Distal cervical segments of the internal carotid arteries are patent with antegrade flow. Petrous, cavernous, and supraclinoid segments patent without hemodynamically significant stenosis. Origin of the ophthalmic arteries patent bilaterally. ICA termini widely patent. A1 segments, anterior communicating artery common anterior cerebral arteries widely patent and normal. No M1 stenosis or occlusion. Normal MCA bifurcations. Distal MCA branches well perfused and symmetric. Mild distal small vessel atheromatous irregularity. Vertebral arteries widely patent to the vertebrobasilar junction without stenosis. Posterior inferior cerebral arteries patent bilaterally. Basilar artery widely patent to its distal aspect. Superior cerebellar and posterior cerebral arteries widely patent bilaterally. Mild distal small vessel atheromatous irregularity. No aneurysm. MRA NECK FINDINGS Examination technically limited by lack of IV contrast. Aortic arch and origin of the great vessels not well assessed on this examination. Visualized right common carotid artery widely patent to the bifurcation without stenosis. Mild atheromatous narrowing about the proximal right ICA without significant stenosis. Right ICA otherwise widely patent to the skull base without flow-limiting stenosis or occlusion. Visualized left common carotid artery widely patent to the bifurcation. Mild smooth atheromatous narrowing at the origin of the left ICA. Left ICA otherwise  widely patent to the skull base without stenosis or occlusion. Origin of the vertebral arteries not well assessed on this exam. Vertebral arteries otherwise widely patent to the vertebrobasilar junction without stenosis or occlusion. IMPRESSION: MRA HEAD IMPRESSION: 1. Negative intracranial MRA. No large vessel occlusion. No hemodynamically significant or correctable stenosis. 2. Mild distal small vessel  atheromatous irregularity. MRA NECK IMPRESSION: Negative MRA of the neck. No vascular occlusion or hemodynamically significant stenosis identified. Electronically Signed   By: Jeannine Boga M.D.   On: 01/04/2018 22:21   [name] Neck Wo Contrast  Result Date: 01/04/2018 CLINICAL DATA:  Initial evaluation for acute stroke. EXAM: MRA HEAD WITHOUT CONTRAST MRA NECK WITHOUT CONTRAST TECHNIQUE: Angiographic images of the Circle of Willis were obtained using MRA technique without intravenous contrast. Angiographic images of the neck were obtained using MRA technique without intravenous contrast. Carotid stenosis measurements (when applicable) are obtained utilizing NASCET criteria, using the distal internal carotid diameter as the denominator. COMPARISON:  Limited brain MRI performed earlier the same day. FINDINGS: MRA HEAD FINDINGS Distal cervical segments of the internal carotid arteries are patent with antegrade flow. Petrous, cavernous, and supraclinoid segments patent without hemodynamically significant stenosis. Origin of the ophthalmic arteries patent bilaterally. ICA termini widely patent. A1 segments, anterior communicating artery common anterior cerebral arteries widely patent and normal. No M1 stenosis or occlusion. Normal MCA bifurcations. Distal MCA branches well perfused and symmetric. Mild distal small vessel atheromatous irregularity. Vertebral arteries widely patent to the vertebrobasilar junction without stenosis. Posterior inferior cerebral arteries patent bilaterally. Basilar artery widely patent to its distal aspect. Superior cerebellar and posterior cerebral arteries widely patent bilaterally. Mild distal small vessel atheromatous irregularity. No aneurysm. MRA NECK FINDINGS Examination technically limited by lack of IV contrast. Aortic arch and origin of the great vessels not well assessed on this examination. Visualized right common carotid artery widely patent to the bifurcation without  stenosis. Mild atheromatous narrowing about the proximal right ICA without significant stenosis. Right ICA otherwise widely patent to the skull base without flow-limiting stenosis or occlusion. Visualized left common carotid artery widely patent to the bifurcation. Mild smooth atheromatous narrowing at the origin of the left ICA. Left ICA otherwise widely patent to the skull base without stenosis or occlusion. Origin of the vertebral arteries not well assessed on this exam. Vertebral arteries otherwise widely patent to the vertebrobasilar junction without stenosis or occlusion. IMPRESSION: MRA HEAD IMPRESSION: 1. Negative intracranial MRA. No large vessel occlusion. No hemodynamically significant or correctable stenosis. 2. Mild distal small vessel atheromatous irregularity. MRA NECK IMPRESSION: Negative MRA of the neck. No vascular occlusion or hemodynamically significant stenosis identified. Electronically Signed   By: Jeannine Boga M.D.   On: 01/04/2018 22:21   Mr Brain Wo Contrast  Result Date: 01/04/2018 CLINICAL DATA:  84 year old male with altered mental status, dizziness, near-syncope. EXAM: MRI HEAD WITHOUT CONTRAST TECHNIQUE: Multiplanar, multiecho pulse sequences of the brain and surrounding structures were obtained without intravenous contrast. COMPARISON:  Head CT 1252 hours today. FINDINGS: The examination was discontinued prior to completion by patient request. Only axial diffusion weighted imaging was obtained. There is a small 2-3 millimeter focus of restricted diffusion in the right anterior frontal lobe subcortical white matter on series 3, image 34. No other restricted diffusion or acute infarction. Chronic encephalomalacia in the left superior frontal gyrus, bilateral parietal lobes, right occipital pole, and posterior inferior left temporal lobe. These areas demonstrate facilitated diffusion. Evidence of small chronic right cerebellar infarcts was more apparent by CT.  Stable  ventricle size and configuration. No intracranial mass effect. IMPRESSION: 1. Positive for a small acute white matter infarct in the anterior right frontal lobe. 2. The examination was discontinued prior to completion by patient request and only axial diffusion weighted imaging was obtained. 3. Scattered chronic cortical infarcts in both hemispheres with encephalomalacia. Electronically Signed   By: Genevie Ann M.D.   On: 01/04/2018 16:11     Labs:   Basic Metabolic Panel: Recent Labs  Lab 01/04/18 1210 01/05/18 0217  NA 140 141  K 4.2 3.7  CL 109 110  CO2 23 23  GLUCOSE 220* 129*  BUN 22 25*  CREATININE 1.41* 1.37*  CALCIUM 9.2 8.4*   GFR Estimated Creatinine Clearance: 50.6 mL/min (A) (by C-G formula based on SCr of 1.37 mg/dL (H)). Liver Function Tests: Recent Labs  Lab 01/04/18 1210  AST 31  ALT 38  ALKPHOS 54  BILITOT 0.6  PROT 6.3*  ALBUMIN 3.3*   No results for input(s): LIPASE, AMYLASE in the last 168 hours. No results for input(s): AMMONIA in the last 168 hours. Coagulation profile No results for input(s): INR, PROTIME in the last 168 hours.  CBC: Recent Labs  Lab 01/04/18 1210 01/05/18 0217  WBC 7.0 8.0  NEUTROABS 4.9  --   HGB 13.3 12.4*  HCT 41.4 37.0*  MCV 100.2* 98.9  PLT 146* 137*   Cardiac Enzymes: No results for input(s): CKTOTAL, CKMB, CKMBINDEX, TROPONINI in the last 168 hours. BNP: Invalid input(s): POCBNP CBG: Recent Labs  Lab 01/05/18 1115  GLUCAP 134*   D-Dimer No results for input(s): DDIMER in the last 72 hours. Hgb A1c Recent Labs    01/05/18 0217  HGBA1C 7.2*   Lipid Profile Recent Labs    01/05/18 0217  CHOL 178  HDL 38*  LDLCALC 112*  TRIG 142  CHOLHDL 4.7   Thyroid function studies Recent Labs    01/05/18 0217  TSH 5.474*   Anemia work up Recent Labs    01/05/18 [place] 211   Microbiology No results found for this or any previous visit (from the past 240 hour(s)).   Discharge Instructions:    Discharge Instructions    Ambulatory referral to Neurology   Complete by:  As directed    Follow up with stroke clinic NP (Jessica Vanschaick or Cecille Rubin, if both not available, consider Zachery Dauer, or [facility]) at [facility] in about 4 weeks. Thanks.   Diet - low sodium heart healthy   Complete by:  As directed    Diet Carb Modified   Complete by:  As directed    Discharge instructions   Complete by:  As directed    ASA 81 mg daily and Plavix 75 mg daily. Continue DAPT for 3 weeks and then plavix alone 30 day event monitor with cardiology   Increase activity slowly   Complete by:  As directed      Allergies as of 01/05/2018      Reactions   Morphine And Related Other (See Comments)   Motrin [ibuprofen] Other (See Comments)   Dry heaves   Sulfa Antibiotics Other (See Comments)   Dry heaves      Medication List    STOP taking these medications   ranitidine 300 MG tablet Commonly known as:  ZANTAC     TAKE these medications   aspirin 81 MG EC tablet Take 1 tablet (81 mg total) by mouth daily. Start taking on:  01/06/2018   atorvastatin 40  MG tablet Commonly known as:  LIPITOR Take 1 tablet (40 mg total) by mouth daily at 6 PM.   clopidogrel 75 MG tablet Commonly known as:  PLAVIX Take 1 tablet (75 mg total) by mouth daily.   cyanocobalamin 1000 MCG tablet Take 1 tablet (1,000 mcg total) by mouth daily. Start taking on:  01/06/2018   metFORMIN 500 MG tablet Commonly known as:  GLUCOPHAGE TAKE 1 TABLET BY MOUTH TWICE DAILY WITH MEAL What changed:  See the new instructions.      Follow-up Information    [facility]. Schedule an appointment as soon as possible for a visit in 4 week(s).   Specialty:  Neurology Contact information: [address] [place], [facility] Follow up.   Specialty:  [place] Why:  they will contact you for the first visit Contact  information: [address] [place] [address] [phone]        Libby Maw, MD Follow up in 1 week(s).   Specialty:  Family Medicine Contact information: [place] [address] [phone]            Time coordinating discharge: 25 min  Signed:  Geradine Girt  [facility] 01/05/2018, 2:17 PM

## 2018-01-05 NOTE — Progress Notes (Signed)
OT Cancellation Note  Patient Details Name: Jeremiah Mccoy MRN: [id number] DOB: [birth year]   Cancelled Treatment:    Reason Eval/Treat Not Completed: Patient at procedure or test/ unavailable.  Will reattempt.  Lucille Passy, OTR/L Acute Rehabilitation Services Pager [phone] Office [phone]   Lucille Passy M 01/05/2018, 10:04 AM

## 2018-01-05 NOTE — Progress Notes (Signed)
Patient arrived in the unit at 2045pm in a bed escorted by ED nurse, pt seems very pleasant, alert and oriented but difficulty remembering date and time, denies of any discomfort, resting in a bed, set up tele monitor device, running SR, bed is on lowest position, call bell and personal belongings are within reach, and will continue to monitor closely.

## 2018-01-05 NOTE — Discharge Instructions (Signed)
Wear TED hose when getting up

## 2018-01-05 NOTE — Progress Notes (Signed)
Preliminary notes--Bilateral lower extremities venous duplex exam completed. Negative for DVT. Jeremiah Mccoy (RDMS RVT) 01/05/18 9:05 AM

## 2018-01-05 NOTE — Progress Notes (Signed)
Patient aware that he is being discharge today and wanted to leave now taking off TELE monitor and PIV. RN explained to him that he still needs to see PT and their recommendation. RN noted pt verbally disrespect wife and dressed, walking out of room. Pt discharge to home per order and leave per his request. RN reviewed discharge instruction with wife/pt.  Ave Filter, RN

## 2018-01-05 NOTE — Progress Notes (Signed)
STROKE TEAM PROGRESS NOTE   SUBJECTIVE (INTERVAL HISTORY) His wife is at the bedside.  [name] back to baseline, lying in chair without distress. He can not remember much of what happened yesterday. Wife recounted HPI with me. Pt was outside yesterday taking to person who was working on the light pool outside. When he tried to go back home, he had dizziness, sweating, nearly passed out and he was lying on the ground. Neighbor saw him and called 911. Wife said pt 72min aphasia back in 07/2017 and right side weakness with slurry speech in 11/2016. Since then he had significant cognitive impairment and very bad short memory loss.  Pt denies any heart palpitation  On this admission, orthostatic vital showed significant BP drop on standing up.   OBJECTIVE Vitals:   01/04/18 2139 01/04/18 2340 01/05/18 0305 01/05/18 0724  BP: 138/77 134/70 130/64 (!) 147/88  Pulse: 69 63 67 (!) 58  Resp: 20 20 18 20   Temp:  97.6 F (36.4 C) 97.9 F (36.6 C) 98.2 F (36.8 C)  TempSrc:  Oral Oral Oral  SpO2: 97% 95% 98% 97%  Weight:      Height:        CBC:  Recent Labs  Lab 01/04/18 1210 01/05/18 0217  WBC 7.0 8.0  NEUTROABS 4.9  --   HGB 13.3 12.4*  HCT 41.4 37.0*  MCV 100.2* 98.9  PLT 146* 137*    Basic Metabolic Panel:  Recent Labs  Lab 01/04/18 1210 01/05/18 0217  NA 140 141  K 4.2 3.7  CL 109 110  CO2 23 23  GLUCOSE 220* 129*  BUN 22 25*  CREATININE 1.41* 1.37*  CALCIUM 9.2 8.4*    Lipid Panel:     Component Value Date/Time   CHOL 178 01/05/2018 0217   TRIG 142 01/05/2018 0217   HDL 38 (L) 01/05/2018 0217   CHOLHDL 4.7 01/05/2018 0217   VLDL 28 01/05/2018 0217   LDLCALC 112 (H) 01/05/2018 0217   HgbA1c:  Lab Results  Component Value Date   HGBA1C 7.2 (H) 01/05/2018   Urine Drug Screen: No results found for: LABOPIA, COCAINSCRNUR, LABBENZ, AMPHETMU, THCU, LABBARB  Alcohol Level No results found for: ETH  IMAGING   Ct Head Wo Contrast 01/04/2018 IMPRESSION:  No acute  abnormality. Atrophy, chronic microvascular ischemic change and remote infarcts as described above.   [name] Head Wo Contrast 01/04/2018 IMPRESSION:   MRA HEAD IMPRESSION:  1. Negative intracranial MRA. No large vessel occlusion. No hemodynamically significant or correctable stenosis.  2. Mild distal small vessel atheromatous irregularity.  MRA NECK  IMPRESSION:  Negative MRA of the neck. No vascular occlusion or hemodynamically significant stenosis identified.     Mr Brain Wo Contrast 01/04/2018 IMPRESSION:  1. Positive for a small acute white matter infarct in the anterior right frontal lobe.  2. The examination was discontinued prior to completion by patient request and only axial diffusion weighted imaging was obtained.  3. Scattered chronic cortical infarcts in both hemispheres with encephalomalacia.    Bilateral LE Venous Dopplers  01/05/2018 Preliminary notes--Bilateral lower extremities venous duplex exam completed. Negative for DVT.     PHYSICAL EXAM  Temp:  [97.6 F (36.4 C)-98.2 F (36.8 C)] 98.2 F (36.8 C) (10/11 0724) Pulse Rate:  [58-74] 58 (10/11 0724) Resp:  [11-23] 20 (10/11 0724) BP: (112-171)/(64-100) 147/88 (10/11 0724) SpO2:  [95 %-100 %] 97 % (10/11 0724) Weight:  [102.2 kg] 102.2 kg (10/10 2101)  General - Well nourished,  well developed, in no apparent distress.  Ophthalmologic - fundi not visualized due to noncooperation.  Cardiovascular - Regular rate and rhythm.  Mental Status -  Level of arousal and orientation to people and place, but not orientated to age, time or situation. Language including expression, naming, repetition, comprehension was assessed and found intact. Attention span and concentration were impaired Recent and remote memory were significant impaired.  Cranial Nerves II - XII - II - Visual field intact OU. III, IV, VI - Extraocular movements intact. V - Facial sensation intact bilaterally. VII - Facial movement  intact bilaterally. VIII - Hearing & vestibular intact bilaterally. X - Palate elevates symmetrically. XI - Chin turning & shoulder shrug intact bilaterally. XII - Tongue protrusion intact.  Motor Strength - The patient's strength was normal in all extremities and pronator drift was absent.  Bulk was normal and fasciculations were absent.   Motor Tone - Muscle tone was assessed at the neck and appendages and was normal.  Reflexes - The patient's reflexes were symmetrical in all extremities and he had no pathological reflexes.  Sensory - Light touch, temperature/pinprick were assessed and were symmetrical.    Coordination - The patient had normal movements in the hands and feet with no ataxia or dysmetria.  Tremor was absent.  Gait and Station - deferred.    ASSESSMENT/PLAN [name] is a 84 y.o. male with history of hypertension, diabetes, prostate cancer, stroke in 2018 with residual right sided paresthesias and aphasia, medical non compliance, and short term memory problems  presenting with syncope. He did not receive IV t-PA due to no focal deficits.  Stroke:  Punctate infarct anterior right frontal lobe - embolic source unknown vs. Small vessel disease  Resultant  Back to baseline  CT head - remote infarcts including right MCA, MCA/PCA, left parietal    MRI head - Positive for a punctate acute white matter infarct in the anterior right frontal lobe.   MRA head H&N - negative  B LE venous dopplers - neg for DVT  2D Echo - EF 60-65%  LDL - 112  HgbA1c - 7.2  VTE prophylaxis - Lovenox  Diet - Heart healthy / carb modified with thin liquids.  Pt had been prescribed Plavix but was not taking prior to admission, now on ASA 81 mg daily and Plavix 75 mg daily. Continue DAPT for 3 weeks and then plavix alone  Patient counseled to be compliant with his antithrombotic medications  Ongoing aggressive stroke risk factor management  Therapy recommendations:   pending  Disposition:  Pending  Orthostatic hypotension  Near syncope or syncope on admission  Orthostatic vital showed significant BP drop on standing  Likely the cause of near syncope  Place TED hose  Slow on standing up with supervision  Treatment as per primary team  Hx of stroke  11/2016 - right arm weakness with dysarthria - consider stroke at OSH - resolved in 3 hr  07/2017 - expressive aphasia - lasting 63min  CT head showed old right MCA, MCA/PCA, and left parietal infarcts  Given the recurrent stroke like episodes and old infarcts on CT/MRI, recommend 30 day cardiac event monitoring to rule out afib as outpt  Pt has cardiology follow up with Dr. Ellyn Hack  Hypertension  Stable . Long-term BP goal normotensive  Hyperlipidemia  Lipid lowering medication PTA:  none  LDL 112, goal < 70  Current lipid lowering medication: lipitor 40  Continue statin at discharge  Diabetes  HgbA1c 7.2, goal <  7.0  Uncontrolled  Not compliant with meds at home - educated on medication compliance  SSI  CBG monitoring  Other Stroke Risk Factors  Advanced age  Obesity, Body mass index is 31.42 kg/m., recommend weight loss, diet and exercise as appropriate   Family hx stroke (father)  Medical non compliance  Other Active Problems  Creatinine elevated - 1.41->1.37  prostate cancer s/p sx 8 years ago - recent elevation of PSA - on hormonal therapy now  Short memory deficit - outpt neuro follow up  [facility] day # 0  Neurology will sign off. Please call with questions. Pt will follow up with stroke clinic NP at [facility] in about 4 weeks. Thanks for the consult.  Rosalin Hawking, MD PhD Stroke Neurology 01/05/2018 1:54 PM   To contact Stroke Continuity provider, please refer to [email or web address] After hours, contact General Neurology

## 2018-01-05 NOTE — Evaluation (Signed)
Occupational Therapy Evaluation Patient Details Name: Jeremiah Mccoy MRN: [id number] DOB: 1933/10/09 Today's Date: 01/05/2018    History of Present Illness This 84 y.o. male admitted after near syncopal event while taking a walk.  He lowered himself to the ground.  MRI of the brain showed small acute white matter infarct in the anterior Rt frontal lobe.  PMH includes:  recurrent prostate CA, DM, h/o CVA with Rt sided parasthesias,  h/o signficant memory deficits   Clinical Impression   the patient admitted with the above, and he demonstrates the below listed deficits.   He demonstrates impaired balance, decreased safety awareness, cognitive deficits including deficits with attention, problem solving, and memory.   He requires supervision - min A for ADLs.  He lives with spouse, who reports he is at, or close to baseline, but he had a stroke ~1 year ago, that caused many of the deficits he is currently demonstrating and he did not undergo therapy at that time.  [name] stubborn and does things his way.  After discussion re: benefits, both pt and wife agreeable to [facility] and PT.  No DME recommended.  All further OT needs can be addressed by [facility].      Follow Up Recommendations  Home health OT;Supervision/Assistance - 24 hour    Equipment Recommendations  None recommended by OT    Recommendations for Other Services       Precautions / Restrictions Precautions Precautions: Fall Precaution Comments: wife reports pt has had 3 falls since they moved to [place] in November 2018      Mobility Bed Mobility               General bed mobility comments: Pt up ambulating in the room   Transfers Overall transfer level: Needs assistance   Transfers: Sit to/from Stand;Stand Pivot Transfers Sit to Stand: Supervision Stand pivot transfers: Supervision       General transfer comment: supervision for safety.  Pt often carries SPC and furniture walks     Balance Overall balance  assessment: Needs assistance Sitting-balance support: No upper extremity supported Sitting balance-Leahy Scale: Good     Standing balance support: During functional activity;Single extremity supported Standing balance-Leahy Scale: Fair Standing balance comment: able to maintain static standing with supervision                            ADL either performed or assessed with clinical judgement   ADL Overall ADL's : Needs assistance/impaired Eating/Feeding: Independent   Grooming: Wash/dry hands;Wash/dry face;Oral care;Brushing hair;Supervision/safety;Standing Grooming Details (indicate cue type and reason): verbal cues to complete task as he self distracts  Upper Body Bathing: Supervision/ safety;Sitting   Lower Body Bathing: Supervison/ safety;Sit to/from stand Lower Body Bathing Details (indicate cue type and reason): verbal cues for sequencing and thoroughness  Upper Body Dressing : Set up;Supervision/safety;Sitting   Lower Body Dressing: Supervision/safety;Sit to/from stand   Toilet Transfer: Supervision/safety;Ambulation;Comfort height toilet(SPC)           Functional mobility during ADLs: Supervision/safety(within the room )       Vision Baseline Vision/History: Wears glasses Wears Glasses: At all times Patient Visual Report: No change from baseline Vision Assessment?: No apparent visual deficits     Perception Perception Perception Tested?: Yes   Praxis Praxis Praxis tested?: Within functional limits    Pertinent Vitals/Pain Pain Assessment: No/denies pain     Hand Dominance Right   Extremity/Trunk Assessment Upper Extremity Assessment Upper  Extremity Assessment: Overall WFL for tasks assessed   Lower Extremity Assessment Lower Extremity Assessment: Defer to PT evaluation   Cervical / Trunk Assessment Cervical / Trunk Assessment: Kyphotic   Communication Communication Communication: No difficulties   Cognition Arousal/Alertness:  Awake/alert Behavior During Therapy: WFL for tasks assessed/performed;Impulsive Overall Cognitive Status: History of cognitive impairments - at baseline                                 General Comments: wife reports pt is at his baseline level of functioning.  he demonstrates deficits with memory, attention, and safety awareness    General Comments  wife reports pt is close to his baseline, but had a significant decline in function ~1 year ago when he had a previous CVA - she reports balance became impaired, as well as cognition.  He did not recieve any therapy at that time s    Exercises     Shoulder Instructions      Home Living Family/patient expects to be discharged to:: Private residence Living Arrangements: Spouse/significant other Available Help at Discharge: Family Type of Home: Apartment Home Access: Stairs to enter Technical brewer of Steps: 1 Entrance Stairs-Rails: None Home Layout: One level     Bathroom Shower/Tub: Teacher, early years/pre: [place]: Environmental consultant - 2 wheels;Cane - single point;Tub bench          Prior Functioning/Environment Level of Independence: Independent with assistive device(s)        Comments: per wife, pt does things his way.   She reports he ambulates with SPC, but doesn't use it correctly, and often carries it.  She recently bought him a tub transfer bench and instructed him how to use it, but he continues to step over the tub which has caused near falls.  She reports he is mostly sedentary, and walks from bedroom to living room, to bathroom only.  occasionally he will walk to the car.            OT Problem List: Decreased strength;Decreased activity tolerance;Impaired balance (sitting and/or standing);Decreased safety awareness;Decreased knowledge of use of DME or AE;Decreased cognition      OT Treatment/Interventions:      OT Goals(Current goals can be found in the care plan section)  Acute Rehab OT Goals Patient Stated Goal: can you find that doctor so I can go home  OT Goal Formulation: All assessment and education complete, DC therapy  OT Frequency:     Barriers to D/C:            Co-evaluation              AM-PAC PT "6 Clicks" Daily Activity     Outcome Measure Help from another person eating meals?: None Help from another person taking care of personal grooming?: A Little Help from another person toileting, which includes using toliet, bedpan, or urinal?: A Little Help from another person bathing (including washing, rinsing, drying)?: A Little Help from another person to put on and taking off regular upper body clothing?: A Little Help from another person to put on and taking off regular lower body clothing?: A Little 6 Click Score: 19   End of Session Equipment Utilized During Treatment: Other (comment)(SPC ) Nurse Communication: Mobility status  Activity Tolerance: Patient tolerated treatment well Patient left: in chair;with call bell/phone within reach;with family/visitor present  OT Visit Diagnosis: Unsteadiness on  feet (R26.81);Cognitive communication deficit (R41.841) Symptoms and signs involving cognitive functions: Cerebral infarction                Time: 1114-1150 OT Time Calculation (min): 36 min Charges:  OT General Charges $OT Visit: 1 Visit OT Evaluation $OT Eval Moderate Complexity: 1 Mod OT Treatments $Self Care/Home Management : 8-22 mins  Lucille Passy, OTR/L [place] Pager [phone] Office [phone]   Lucille Passy M 01/05/2018, 1:41 PM

## 2018-01-07 DIAGNOSIS — Z7984 Long term (current) use of oral hypoglycemic drugs: Secondary | ICD-10-CM | POA: Diagnosis not present

## 2018-01-07 DIAGNOSIS — Z7902 Long term (current) use of antithrombotics/antiplatelets: Secondary | ICD-10-CM | POA: Diagnosis not present

## 2018-01-07 DIAGNOSIS — F329 Major depressive disorder, single episode, unspecified: Secondary | ICD-10-CM | POA: Diagnosis not present

## 2018-01-07 DIAGNOSIS — K219 Gastro-esophageal reflux disease without esophagitis: Secondary | ICD-10-CM | POA: Diagnosis not present

## 2018-01-07 DIAGNOSIS — I69351 Hemiplegia and hemiparesis following cerebral infarction affecting right dominant side: Secondary | ICD-10-CM | POA: Diagnosis not present

## 2018-01-07 DIAGNOSIS — I69320 Aphasia following cerebral infarction: Secondary | ICD-10-CM | POA: Diagnosis not present

## 2018-01-07 DIAGNOSIS — C61 Malignant neoplasm of prostate: Secondary | ICD-10-CM | POA: Diagnosis not present

## 2018-01-07 DIAGNOSIS — I10 Essential (primary) hypertension: Secondary | ICD-10-CM | POA: Diagnosis not present

## 2018-01-07 DIAGNOSIS — D649 Anemia, unspecified: Secondary | ICD-10-CM | POA: Diagnosis not present

## 2018-01-07 DIAGNOSIS — Z9181 History of falling: Secondary | ICD-10-CM | POA: Diagnosis not present

## 2018-01-07 DIAGNOSIS — E119 Type 2 diabetes mellitus without complications: Secondary | ICD-10-CM | POA: Diagnosis not present

## 2018-01-07 DIAGNOSIS — Z7982 Long term (current) use of aspirin: Secondary | ICD-10-CM | POA: Diagnosis not present

## 2018-01-07 DIAGNOSIS — D696 Thrombocytopenia, unspecified: Secondary | ICD-10-CM | POA: Diagnosis not present

## 2018-01-08 ENCOUNTER — Telehealth: Payer: Self-pay

## 2018-01-08 ENCOUNTER — Telehealth: Payer: Self-pay | Admitting: Behavioral Health

## 2018-01-08 ENCOUNTER — Other Ambulatory Visit: Payer: Self-pay

## 2018-01-08 ENCOUNTER — Telehealth: Payer: Self-pay | Admitting: Family Medicine

## 2018-01-08 DIAGNOSIS — I69351 Hemiplegia and hemiparesis following cerebral infarction affecting right dominant side: Secondary | ICD-10-CM | POA: Diagnosis not present

## 2018-01-08 DIAGNOSIS — I10 Essential (primary) hypertension: Secondary | ICD-10-CM | POA: Diagnosis not present

## 2018-01-08 DIAGNOSIS — I69320 Aphasia following cerebral infarction: Secondary | ICD-10-CM | POA: Diagnosis not present

## 2018-01-08 DIAGNOSIS — C61 Malignant neoplasm of prostate: Secondary | ICD-10-CM | POA: Diagnosis not present

## 2018-01-08 DIAGNOSIS — E119 Type 2 diabetes mellitus without complications: Secondary | ICD-10-CM | POA: Diagnosis not present

## 2018-01-08 DIAGNOSIS — D649 Anemia, unspecified: Secondary | ICD-10-CM | POA: Diagnosis not present

## 2018-01-08 NOTE — Telephone Encounter (Signed)
Verbal given 

## 2018-01-08 NOTE — Telephone Encounter (Signed)
Agreed -

## 2018-01-08 NOTE — Telephone Encounter (Signed)
Copied from [place] [phone]. Topic: Quick Communication - Home Health Verbal Orders >> Jan 08, 2018  9:08 AM Jodie Echevaria wrote: Caller/Agency: Trudee Kuster Number: [phone] Requesting OT/PT/Skilled Nursing/Social Work:  PT Frequency: 2xs week for 5 wks

## 2018-01-08 NOTE — Telephone Encounter (Signed)
Pt's wife [name] called back.

## 2018-01-08 NOTE — Telephone Encounter (Signed)
Copied from [place] [id number]. Topic: Quick Communication - Home Health Verbal Orders >> Jan 08, 2018  4:00 PM Valla Leaver wrote: Caller/Agency: [facility] Number: [phone] Requesting OT/PT/Skilled Nursing/Social Work: Skilled nursing Frequency: 1x a wk for 4wks and 3 PRN's

## 2018-01-08 NOTE — Patient Outreach (Signed)
[facility]) Care Management  01/08/2018  Apolinar Bero Aug 07, 1933 [id number]  EMMI: stroke Referral date: 01/08/18 Referral reason: scheduled a follow up appointment Insurance: Medicare  Day # 1  Telephone call to patient regarding referral. Unable to reach patient. HIPAA compliant voice message left with call back phone number.   PLAN: RNCM will attempt 2nd telephone call to patient within 4 business days. RNCM will send outreach letter.   Quinn Plowman RN,BSN, [place] Telephonic  [phone]

## 2018-01-08 NOTE — Telephone Encounter (Signed)
Unable to reach patient/spouse at time of TCM/Hospital Follow-up call. Left message for patient/spouse to return call when available.

## 2018-01-09 ENCOUNTER — Other Ambulatory Visit: Payer: Self-pay

## 2018-01-09 NOTE — Telephone Encounter (Signed)
Verbal given to [name].

## 2018-01-09 NOTE — Telephone Encounter (Signed)
Transition Care Management Follow-up Telephone Call  PCP: Libby Maw, MD  Admit date: 01/04/2018 Discharge date: 01/05/2018  Admitted From: home Discharge disposition: home   How have you been since you were released from the hospital? Patient's wife voiced that on Sunday morning both the home health nurse & physical therapist came to help assist with care. Also, on yesterday the occupational therapist visited the home for an evaluation.   Do you understand why you were in the hospital? yes, per patient's wife, he had a stroke.   Do you understand the discharge instructions? yes   Where were you discharged to? Home with wife.   Items Reviewed:  Medications reviewed: yes  Allergies reviewed: yes  Dietary changes reviewed: yes, low carb diet.  Referrals reviewed: yes, follow-up with PCP & neurology; home health.   Functional Questionnaire:   Activities of Daily Living (ADLs):   He states they are independent in the following: bathing and hygiene, feeding, continence, grooming, toileting and dressing States they require assistance with the following: ambulate with walker.   Any transportation issues/concerns?: no   Any patient concerns? no, per patient's wife.   Confirmed importance and date/time of follow-up visits scheduled yes, 01/11/18 at 11:30 AM.  Provider Appointment booked with Dr. Ethelene Hal.  Confirmed with patient if condition begins to worsen call PCP or go to the ER.  Patient was given the office number and encouraged to call back with question or concerns.  : yes

## 2018-01-09 NOTE — Telephone Encounter (Signed)
Okay 

## 2018-01-09 NOTE — Patient Outreach (Signed)
[place] [facility]) Care Management  01/09/2018  Jeremiah Mccoy 1934-03-08 [id number]   EMMI: stroke red alert Referral date: 01/08/18, 01/09/18 Referral reason: scheduled a follow up appointment,  Feeling worse overall: yes,  Questions/ problems with medications: yes Insurance: Medicare  Day # 1 and Day #3   Telephone call to patient regarding EMMI stroke red alert.  Spoke with patients wife and designated party release, [place] who verified HIPAA for patient. Explained reason for call. Wife states, "Its a fight giving him his medications."  Wife states she gives patient his medications and sometimes will find that he has not taken them.  Wife states he is noncompliant with his medications. She states patient's doctor is aware of this. She reports patient took his medication this morning.   Wife states patient is complaining of hot flashes. She states patient has recently started hormonal therapy for prostates cancer.  RNCM advised wife to contact oncology office to discuss and report symptom.   Wife verbalized understanding and agreement.   Wife states patient has some short term memory loss. She reports patient has had strokes in the past. She reports patients doctor is aware of the short term memory loss. She states patient also has slurred speech from time to time. She states this has been ongoing for a while.  Wife states patient has a follow up appointment with the neurologist on 02/08/18 and a follow up with his primary MD on 01/11/18.  Wife reports patient has transportation to his appointments Wife states [facility] is providing services for patient.  Wife states daughter also assist in patients Care.  RNCM advised wife to notify MD of any changes in condition prior to scheduled appointment. RNCM verified wife aware of 911 services for urgent/ emergent needs.  Advised wife that patient  would continue to get automated EMMI-Stroke post discharge calls to  assess how he is  doing following recent hospitalization and will receive a call from a nurse if any of his responses are abnormal. Wife voiced understanding and was appreciative of follow up call.  PLAN; RNCM will close patient due to patient being assessed and having no further needs at this time.   Quinn Plowman RN,BSN,CCM [facility] Telephonic  [phone]

## 2018-01-09 NOTE — Telephone Encounter (Signed)
I tried contacting [name], unable to leave a voicemail.

## 2018-01-10 ENCOUNTER — Ambulatory Visit: Payer: Self-pay

## 2018-01-10 DIAGNOSIS — I69351 Hemiplegia and hemiparesis following cerebral infarction affecting right dominant side: Secondary | ICD-10-CM | POA: Diagnosis not present

## 2018-01-10 DIAGNOSIS — I69320 Aphasia following cerebral infarction: Secondary | ICD-10-CM | POA: Diagnosis not present

## 2018-01-10 DIAGNOSIS — I10 Essential (primary) hypertension: Secondary | ICD-10-CM | POA: Diagnosis not present

## 2018-01-10 DIAGNOSIS — C61 Malignant neoplasm of prostate: Secondary | ICD-10-CM | POA: Diagnosis not present

## 2018-01-10 DIAGNOSIS — E119 Type 2 diabetes mellitus without complications: Secondary | ICD-10-CM | POA: Diagnosis not present

## 2018-01-10 DIAGNOSIS — D649 Anemia, unspecified: Secondary | ICD-10-CM | POA: Diagnosis not present

## 2018-01-11 ENCOUNTER — Encounter: Payer: Self-pay | Admitting: Family Medicine

## 2018-01-11 ENCOUNTER — Ambulatory Visit (INDEPENDENT_AMBULATORY_CARE_PROVIDER_SITE_OTHER): Payer: Medicare Other | Admitting: Family Medicine

## 2018-01-11 VITALS — BP 126/70 | HR 99 | Ht 71.0 in | Wt 197.0 lb

## 2018-01-11 DIAGNOSIS — E118 Type 2 diabetes mellitus with unspecified complications: Secondary | ICD-10-CM | POA: Diagnosis not present

## 2018-01-11 DIAGNOSIS — C61 Malignant neoplasm of prostate: Secondary | ICD-10-CM | POA: Diagnosis not present

## 2018-01-11 DIAGNOSIS — I639 Cerebral infarction, unspecified: Secondary | ICD-10-CM | POA: Diagnosis not present

## 2018-01-11 DIAGNOSIS — E7800 Pure hypercholesterolemia, unspecified: Secondary | ICD-10-CM

## 2018-01-11 DIAGNOSIS — E785 Hyperlipidemia, unspecified: Secondary | ICD-10-CM

## 2018-01-11 DIAGNOSIS — D696 Thrombocytopenia, unspecified: Secondary | ICD-10-CM

## 2018-01-11 DIAGNOSIS — Z794 Long term (current) use of insulin: Secondary | ICD-10-CM

## 2018-01-11 DIAGNOSIS — Z09 Encounter for follow-up examination after completed treatment for conditions other than malignant neoplasm: Secondary | ICD-10-CM | POA: Insufficient documentation

## 2018-01-11 DIAGNOSIS — Z8673 Personal history of transient ischemic attack (TIA), and cerebral infarction without residual deficits: Secondary | ICD-10-CM | POA: Diagnosis not present

## 2018-01-11 MED ORDER — METFORMIN HCL 500 MG PO TABS: ORAL_TABLET | ORAL | 2 refills | Status: DC

## 2018-01-11 MED ORDER — ATORVASTATIN CALCIUM 40 MG PO TABS: 40.0000 mg | ORAL_TABLET | Freq: Every day | ORAL | 2 refills | Status: DC

## 2018-01-11 MED ORDER — CLOPIDOGREL BISULFATE 75 MG PO TABS: 75.0000 mg | ORAL_TABLET | Freq: Every day | ORAL | 2 refills | Status: DC

## 2018-01-11 NOTE — Progress Notes (Signed)
Subjective:  Patient ID: Jeremiah Mccoy, male    DOB: [birth year]  Age: 84 y.o. MRN: [id number]  CC: No chief complaint on file.   HPI Jeremiah Mccoy presents for hospital discharge follow-up status post recent acute stroke involving the right frontal lobe.  Patient is improving on a daily basis is attended by his wife and visiting physical therapy.  He has not always compliant using his cane and walker.  He has been compliant with his medicines, more so than in the past, due to his wife's persistence.  He is having no difficulty chewing swallowing his food.  There is been no choking.  He is able to perform all of his activity of daily living to include toileting, shaving eating and dressing.  MRA studies of his neck and brain did show widely patent arterial blood flow to his brain minor areas of stenosis.  Outpatient Medications Prior to Visit  Medication Sig Dispense Refill  . aspirin EC 81 MG EC tablet Take 1 tablet (81 mg total) by mouth daily. 14 tablet 0  . vitamin B-12 1000 MCG tablet Take 1 tablet (1,000 mcg total) by mouth daily. 30 tablet 0  . atorvastatin (LIPITOR) 40 MG tablet Take 1 tablet (40 mg total) by mouth daily at 6 PM. 30 tablet 0  . clopidogrel (PLAVIX) 75 MG tablet Take 1 tablet (75 mg total) by mouth daily. 30 tablet 0  . metFORMIN (GLUCOPHAGE) 500 MG tablet TAKE 1 TABLET BY MOUTH TWICE DAILY WITH MEAL (Patient taking differently: Take 500 mg by mouth daily with breakfast. ) 60 tablet 0   No facility-administered medications prior to visit.     ROS Review of Systems  Constitutional: Negative.   HENT: Negative.   Eyes: Negative for photophobia and visual disturbance.  Respiratory: Negative.   Cardiovascular: Negative.   Gastrointestinal: Negative.   Endocrine: Negative for polyphagia and polyuria.  Genitourinary: Negative.   Musculoskeletal: Negative.   Allergic/Immunologic: Negative for immunocompromised state.  Neurological: Negative for light-headedness and  headaches.  Hematological: Does not bruise/bleed easily.  Psychiatric/Behavioral: Negative.     Objective:  BP 126/70   Pulse 99   Ht 5\' 11"  (1.803 m)   Wt 197 lb (89.4 kg)   SpO2 97%   BMI 27.48 kg/m   BP Readings from Last 3 Encounters:  01/11/18 126/70  01/05/18 (!) 152/88  10/25/17 135/79    Wt Readings from Last 3 Encounters:  01/11/18 197 lb (89.4 kg)  01/04/18 225 lb 5 oz (102.2 kg)  10/25/17 206 lb 12.8 oz (93.8 kg)    Physical Exam  Constitutional: He is oriented to person, place, and time. He appears well-developed and well-nourished. No distress.  HENT:  Head: Normocephalic and atraumatic.  Right Ear: External ear normal.  Left Ear: External ear normal.  Mouth/Throat: Oropharynx is clear and moist. No oropharyngeal exudate.  Eyes: Pupils are equal, round, and reactive to light. Conjunctivae and EOM are normal. Right eye exhibits no discharge. Left eye exhibits no discharge. No scleral icterus.  Neck: Neck supple. No JVD present. No tracheal deviation present.  Cardiovascular: Normal rate, regular rhythm and normal heart sounds.  Pulmonary/Chest: Effort normal and breath sounds normal.  Abdominal: Bowel sounds are normal.  Neurological: He is alert and oriented to person, place, and time. No cranial nerve deficit.  Skin: Skin is warm and dry. He is not diaphoretic.  Psychiatric: He has a normal mood and affect. His behavior is normal.    Lab Results  Component  Value Date   WBC 8.0 01/05/2018   HGB 12.4 (L) 01/05/2018   HCT 37.0 (L) 01/05/2018   PLT 137 (L) 01/05/2018   GLUCOSE 129 (H) 01/05/2018   CHOL 178 01/05/2018   TRIG 142 01/05/2018   HDL 38 (L) 01/05/2018   LDLCALC 112 (H) 01/05/2018   ALT 38 01/04/2018   AST 31 01/04/2018   NA 141 01/05/2018   K 3.7 01/05/2018   CL 110 01/05/2018   CREATININE 1.37 (H) 01/05/2018   BUN 25 (H) 01/05/2018   CO2 23 01/05/2018   TSH 5.474 (H) 01/05/2018   PSA 0.52 07/17/2017   HGBA1C 7.2 (H) 01/05/2018    MICROALBUR 7.7 (H) 03/16/2017    Ct Head Wo Contrast  Result Date: 01/04/2018 CLINICAL DATA:  Acute onset dizziness today. EXAM: CT HEAD WITHOUT CONTRAST TECHNIQUE: Contiguous axial images were obtained from the base of the skull through the vertex without intravenous contrast. COMPARISON:  None. FINDINGS: Brain: No evidence of acute infarction, hemorrhage, hydrocephalus, extra-axial collection or mass lesion/mass effect. The brain is atrophic with chronic microvascular ischemic change and remote right parietal and PCA territory infarcts. Small, remote left parietal subcortical infarct is also seen. There is also a small lacunar infarction in right cerebellar hemisphere. Vascular: No hyperdense vessel or unexpected calcification. Skull: Normal. Negative for fracture or focal lesion. Sinuses/Orbits: Negative. Other: None. IMPRESSION: No acute abnormality. Atrophy, chronic microvascular ischemic change and remote infarcts as described above. Electronically Signed   By: Inge Rise M.D.   On: 01/04/2018 13:13   [name] Head Wo Contrast  Result Date: 01/04/2018 CLINICAL DATA:  Initial evaluation for acute stroke. EXAM: MRA HEAD WITHOUT CONTRAST MRA NECK WITHOUT CONTRAST TECHNIQUE: Angiographic images of the Circle of Willis were obtained using MRA technique without intravenous contrast. Angiographic images of the neck were obtained using MRA technique without intravenous contrast. Carotid stenosis measurements (when applicable) are obtained utilizing NASCET criteria, using the distal internal carotid diameter as the denominator. COMPARISON:  Limited brain MRI performed earlier the same day. FINDINGS: MRA HEAD FINDINGS Distal cervical segments of the internal carotid arteries are patent with antegrade flow. Petrous, cavernous, and supraclinoid segments patent without hemodynamically significant stenosis. Origin of the ophthalmic arteries patent bilaterally. ICA termini widely patent. A1 segments, anterior  communicating artery common anterior cerebral arteries widely patent and normal. No M1 stenosis or occlusion. Normal MCA bifurcations. Distal MCA branches well perfused and symmetric. Mild distal small vessel atheromatous irregularity. Vertebral arteries widely patent to the vertebrobasilar junction without stenosis. Posterior inferior cerebral arteries patent bilaterally. Basilar artery widely patent to its distal aspect. Superior cerebellar and posterior cerebral arteries widely patent bilaterally. Mild distal small vessel atheromatous irregularity. No aneurysm. MRA NECK FINDINGS Examination technically limited by lack of IV contrast. Aortic arch and origin of the great vessels not well assessed on this examination. Visualized right common carotid artery widely patent to the bifurcation without stenosis. Mild atheromatous narrowing about the proximal right ICA without significant stenosis. Right ICA otherwise widely patent to the skull base without flow-limiting stenosis or occlusion. Visualized left common carotid artery widely patent to the bifurcation. Mild smooth atheromatous narrowing at the origin of the left ICA. Left ICA otherwise widely patent to the skull base without stenosis or occlusion. Origin of the vertebral arteries not well assessed on this exam. Vertebral arteries otherwise widely patent to the vertebrobasilar junction without stenosis or occlusion. IMPRESSION: MRA HEAD IMPRESSION: 1. Negative intracranial MRA. No large vessel occlusion. No hemodynamically significant or correctable  stenosis. 2. Mild distal small vessel atheromatous irregularity. MRA NECK IMPRESSION: Negative MRA of the neck. No vascular occlusion or hemodynamically significant stenosis identified. Electronically Signed   By: Jeannine Boga M.D.   On: 01/04/2018 22:21   [name] Neck Wo Contrast  Result Date: 01/04/2018 CLINICAL DATA:  Initial evaluation for acute stroke. EXAM: MRA HEAD WITHOUT CONTRAST MRA NECK WITHOUT  CONTRAST TECHNIQUE: Angiographic images of the Circle of Willis were obtained using MRA technique without intravenous contrast. Angiographic images of the neck were obtained using MRA technique without intravenous contrast. Carotid stenosis measurements (when applicable) are obtained utilizing NASCET criteria, using the distal internal carotid diameter as the denominator. COMPARISON:  Limited brain MRI performed earlier the same day. FINDINGS: MRA HEAD FINDINGS Distal cervical segments of the internal carotid arteries are patent with antegrade flow. Petrous, cavernous, and supraclinoid segments patent without hemodynamically significant stenosis. Origin of the ophthalmic arteries patent bilaterally. ICA termini widely patent. A1 segments, anterior communicating artery common anterior cerebral arteries widely patent and normal. No M1 stenosis or occlusion. Normal MCA bifurcations. Distal MCA branches well perfused and symmetric. Mild distal small vessel atheromatous irregularity. Vertebral arteries widely patent to the vertebrobasilar junction without stenosis. Posterior inferior cerebral arteries patent bilaterally. Basilar artery widely patent to its distal aspect. Superior cerebellar and posterior cerebral arteries widely patent bilaterally. Mild distal small vessel atheromatous irregularity. No aneurysm. MRA NECK FINDINGS Examination technically limited by lack of IV contrast. Aortic arch and origin of the great vessels not well assessed on this examination. Visualized right common carotid artery widely patent to the bifurcation without stenosis. Mild atheromatous narrowing about the proximal right ICA without significant stenosis. Right ICA otherwise widely patent to the skull base without flow-limiting stenosis or occlusion. Visualized left common carotid artery widely patent to the bifurcation. Mild smooth atheromatous narrowing at the origin of the left ICA. Left ICA otherwise widely patent to the skull base  without stenosis or occlusion. Origin of the vertebral arteries not well assessed on this exam. Vertebral arteries otherwise widely patent to the vertebrobasilar junction without stenosis or occlusion. IMPRESSION: MRA HEAD IMPRESSION: 1. Negative intracranial MRA. No large vessel occlusion. No hemodynamically significant or correctable stenosis. 2. Mild distal small vessel atheromatous irregularity. MRA NECK IMPRESSION: Negative MRA of the neck. No vascular occlusion or hemodynamically significant stenosis identified. Electronically Signed   By: Jeannine Boga M.D.   On: 01/04/2018 22:21   Mr Brain Wo Contrast  Result Date: 01/04/2018 CLINICAL DATA:  84 year old male with altered mental status, dizziness, near-syncope. EXAM: MRI HEAD WITHOUT CONTRAST TECHNIQUE: Multiplanar, multiecho pulse sequences of the brain and surrounding structures were obtained without intravenous contrast. COMPARISON:  Head CT 1252 hours today. FINDINGS: The examination was discontinued prior to completion by patient request. Only axial diffusion weighted imaging was obtained. There is a small 2-3 millimeter focus of restricted diffusion in the right anterior frontal lobe subcortical white matter on series 3, image 34. No other restricted diffusion or acute infarction. Chronic encephalomalacia in the left superior frontal gyrus, bilateral parietal lobes, right occipital pole, and posterior inferior left temporal lobe. These areas demonstrate facilitated diffusion. Evidence of small chronic right cerebellar infarcts was more apparent by CT. Stable ventricle size and configuration. No intracranial mass effect. IMPRESSION: 1. Positive for a small acute white matter infarct in the anterior right frontal lobe. 2. The examination was discontinued prior to completion by patient request and only axial diffusion weighted imaging was obtained. 3. Scattered chronic cortical infarcts in both  hemispheres with encephalomalacia. Electronically  Signed   By: Genevie Ann M.D.   On: 01/04/2018 16:11    Assessment & Plan:   Diagnoses and all orders for this visit:  Acute CVA (cerebrovascular accident) ([place])  Type 2 diabetes mellitus with complication, with long-term current use of insulin ([place]) -     metFORMIN (GLUCOPHAGE) 500 MG tablet; TAKE 1 TABLET BY MOUTH TWICE DAILY WITH MEAL  Recurrent prostate cancer ([place]) -     atorvastatin (LIPITOR) 40 MG tablet; Take 1 tablet (40 mg total) by mouth daily at 6 PM.  Elevated cholesterol  History of stroke -     atorvastatin (LIPITOR) 40 MG tablet; Take 1 tablet (40 mg total) by mouth daily at 6 PM. -     clopidogrel (PLAVIX) 75 MG tablet; Take 1 tablet (75 mg total) by mouth daily.  Thrombocytopenia [facility])  Hospital discharge follow-up  Hyperlipidemia LDL goal <70 -     atorvastatin (LIPITOR) 40 MG tablet; Take 1 tablet (40 mg total) by mouth daily at 6 PM.   I am having [name] maintain his aspirin, cyanocobalamin, atorvastatin, clopidogrel, and metFORMIN.  Meds ordered this encounter  Medications  . atorvastatin (LIPITOR) 40 MG tablet    Sig: Take 1 tablet (40 mg total) by mouth daily at 6 PM.    Dispense:  30 tablet    Refill:  2  . clopidogrel (PLAVIX) 75 MG tablet    Sig: Take 1 tablet (75 mg total) by mouth daily.    Dispense:  30 tablet    Refill:  2  . metFORMIN (GLUCOPHAGE) 500 MG tablet    Sig: TAKE 1 TABLET BY MOUTH TWICE DAILY WITH MEAL    Dispense:  60 tablet    Refill:  2   We had a 15-minute discussion about the importance of compliance and preventing another stroke.  He is aware that the next stroke might be likely to be more severe.  GFR in the hospital was 46 and platelet count was 137.  These are significant lab values in light of his current medical therapy.  Follow-up in 1 month  Follow-up: Return in about 1 month (around 02/11/2018).  Libby Maw, MD

## 2018-01-12 DIAGNOSIS — I10 Essential (primary) hypertension: Secondary | ICD-10-CM | POA: Diagnosis not present

## 2018-01-12 DIAGNOSIS — E119 Type 2 diabetes mellitus without complications: Secondary | ICD-10-CM | POA: Diagnosis not present

## 2018-01-12 DIAGNOSIS — C61 Malignant neoplasm of prostate: Secondary | ICD-10-CM | POA: Diagnosis not present

## 2018-01-12 DIAGNOSIS — D649 Anemia, unspecified: Secondary | ICD-10-CM | POA: Diagnosis not present

## 2018-01-12 DIAGNOSIS — I69320 Aphasia following cerebral infarction: Secondary | ICD-10-CM | POA: Diagnosis not present

## 2018-01-12 DIAGNOSIS — I69351 Hemiplegia and hemiparesis following cerebral infarction affecting right dominant side: Secondary | ICD-10-CM | POA: Diagnosis not present

## 2018-01-15 ENCOUNTER — Other Ambulatory Visit: Payer: Self-pay

## 2018-01-15 NOTE — Patient Outreach (Signed)
[facility]) Care Management  01/15/2018  Jeremiah Mccoy 06-01-1933 [id number]     EMMI-STROKE RED ON EMMI ALERT Day # 9 Date: 01/14/18 Red Alert Reason: "Feeling worse overall? Yes  New problems walking/talking/speaking/seeing? Yes"   Outreach attempt # 1 to patient. Spoke with spouse(DPR on file) as she reports patient is walking outside. Reviewed and addressed red alerts with spouse. She immediately reported that the machine heard her responses wrong and she was unable to correct it. She denies patient feeling and worse and/or having new problems. She states that "everything is well and nothing going wrong." She was appreciative of follow up to check on patient but denies any further RN CM needs or concerns at this time. Spouse advised that they would continue to get automated EMMI-Stroke post discharge calls to assess how they are doing following recent hospitalization and will receive a call from a nurse if any of their responses were abnormal. She voiced understanding and was appreciative of f/u call.       Plan: RN CM will close case as no further interventions needed at this time.    Enzo Montgomery, RN,BSN,CCM [place] [facility] Telephonic Care Management Coordinator Direct Phone: [phone] Toll Free: [phone] Fax: [phone]

## 2018-01-16 DIAGNOSIS — I69320 Aphasia following cerebral infarction: Secondary | ICD-10-CM | POA: Diagnosis not present

## 2018-01-16 DIAGNOSIS — I69351 Hemiplegia and hemiparesis following cerebral infarction affecting right dominant side: Secondary | ICD-10-CM | POA: Diagnosis not present

## 2018-01-16 DIAGNOSIS — I10 Essential (primary) hypertension: Secondary | ICD-10-CM | POA: Diagnosis not present

## 2018-01-16 DIAGNOSIS — D649 Anemia, unspecified: Secondary | ICD-10-CM | POA: Diagnosis not present

## 2018-01-16 DIAGNOSIS — C61 Malignant neoplasm of prostate: Secondary | ICD-10-CM | POA: Diagnosis not present

## 2018-01-16 DIAGNOSIS — E119 Type 2 diabetes mellitus without complications: Secondary | ICD-10-CM | POA: Diagnosis not present

## 2018-01-17 DIAGNOSIS — E119 Type 2 diabetes mellitus without complications: Secondary | ICD-10-CM | POA: Diagnosis not present

## 2018-01-17 DIAGNOSIS — D649 Anemia, unspecified: Secondary | ICD-10-CM | POA: Diagnosis not present

## 2018-01-17 DIAGNOSIS — C61 Malignant neoplasm of prostate: Secondary | ICD-10-CM | POA: Diagnosis not present

## 2018-01-17 DIAGNOSIS — I69351 Hemiplegia and hemiparesis following cerebral infarction affecting right dominant side: Secondary | ICD-10-CM | POA: Diagnosis not present

## 2018-01-17 DIAGNOSIS — I10 Essential (primary) hypertension: Secondary | ICD-10-CM | POA: Diagnosis not present

## 2018-01-17 DIAGNOSIS — I69320 Aphasia following cerebral infarction: Secondary | ICD-10-CM | POA: Diagnosis not present

## 2018-01-18 DIAGNOSIS — I10 Essential (primary) hypertension: Secondary | ICD-10-CM | POA: Diagnosis not present

## 2018-01-18 DIAGNOSIS — I69320 Aphasia following cerebral infarction: Secondary | ICD-10-CM | POA: Diagnosis not present

## 2018-01-18 DIAGNOSIS — D649 Anemia, unspecified: Secondary | ICD-10-CM | POA: Diagnosis not present

## 2018-01-18 DIAGNOSIS — E119 Type 2 diabetes mellitus without complications: Secondary | ICD-10-CM | POA: Diagnosis not present

## 2018-01-18 DIAGNOSIS — C61 Malignant neoplasm of prostate: Secondary | ICD-10-CM | POA: Diagnosis not present

## 2018-01-18 DIAGNOSIS — I69351 Hemiplegia and hemiparesis following cerebral infarction affecting right dominant side: Secondary | ICD-10-CM | POA: Diagnosis not present

## 2018-01-19 ENCOUNTER — Ambulatory Visit: Payer: Medicare Other | Admitting: Family Medicine

## 2018-01-19 DIAGNOSIS — I10 Essential (primary) hypertension: Secondary | ICD-10-CM | POA: Diagnosis not present

## 2018-01-19 DIAGNOSIS — D649 Anemia, unspecified: Secondary | ICD-10-CM | POA: Diagnosis not present

## 2018-01-19 DIAGNOSIS — C61 Malignant neoplasm of prostate: Secondary | ICD-10-CM | POA: Diagnosis not present

## 2018-01-19 DIAGNOSIS — I69320 Aphasia following cerebral infarction: Secondary | ICD-10-CM | POA: Diagnosis not present

## 2018-01-19 DIAGNOSIS — I69351 Hemiplegia and hemiparesis following cerebral infarction affecting right dominant side: Secondary | ICD-10-CM | POA: Diagnosis not present

## 2018-01-19 DIAGNOSIS — E119 Type 2 diabetes mellitus without complications: Secondary | ICD-10-CM | POA: Diagnosis not present

## 2018-01-23 DIAGNOSIS — I10 Essential (primary) hypertension: Secondary | ICD-10-CM | POA: Diagnosis not present

## 2018-01-23 DIAGNOSIS — C61 Malignant neoplasm of prostate: Secondary | ICD-10-CM | POA: Diagnosis not present

## 2018-01-23 DIAGNOSIS — E119 Type 2 diabetes mellitus without complications: Secondary | ICD-10-CM | POA: Diagnosis not present

## 2018-01-23 DIAGNOSIS — I69320 Aphasia following cerebral infarction: Secondary | ICD-10-CM | POA: Diagnosis not present

## 2018-01-23 DIAGNOSIS — D649 Anemia, unspecified: Secondary | ICD-10-CM | POA: Diagnosis not present

## 2018-01-23 DIAGNOSIS — I69351 Hemiplegia and hemiparesis following cerebral infarction affecting right dominant side: Secondary | ICD-10-CM | POA: Diagnosis not present

## 2018-01-24 ENCOUNTER — Telehealth: Payer: Self-pay | Admitting: Family Medicine

## 2018-01-24 DIAGNOSIS — I69320 Aphasia following cerebral infarction: Secondary | ICD-10-CM | POA: Diagnosis not present

## 2018-01-24 DIAGNOSIS — D649 Anemia, unspecified: Secondary | ICD-10-CM | POA: Diagnosis not present

## 2018-01-24 DIAGNOSIS — C61 Malignant neoplasm of prostate: Secondary | ICD-10-CM | POA: Diagnosis not present

## 2018-01-24 DIAGNOSIS — E119 Type 2 diabetes mellitus without complications: Secondary | ICD-10-CM | POA: Diagnosis not present

## 2018-01-24 DIAGNOSIS — I10 Essential (primary) hypertension: Secondary | ICD-10-CM | POA: Diagnosis not present

## 2018-01-24 DIAGNOSIS — I69351 Hemiplegia and hemiparesis following cerebral infarction affecting right dominant side: Secondary | ICD-10-CM | POA: Diagnosis not present

## 2018-01-24 NOTE — Telephone Encounter (Signed)
Copied from [place] [id number]. Topic: Quick Communication - Home Health Verbal Orders >> Jan 24, 2018  1:50 PM Sheran Luz wrote: Caller/Agency: [place] Number: [phone] Requesting OT/PT/Skilled Nursing/Social Work: Speech Therapy- cognitive communication deficit due to stroke Frequency: 2xw for 4w

## 2018-01-25 NOTE — Telephone Encounter (Signed)
Ok, thanks.

## 2018-01-25 NOTE — Telephone Encounter (Signed)
Verbal given 

## 2018-01-26 DIAGNOSIS — I69351 Hemiplegia and hemiparesis following cerebral infarction affecting right dominant side: Secondary | ICD-10-CM | POA: Diagnosis not present

## 2018-01-26 DIAGNOSIS — D649 Anemia, unspecified: Secondary | ICD-10-CM | POA: Diagnosis not present

## 2018-01-26 DIAGNOSIS — I10 Essential (primary) hypertension: Secondary | ICD-10-CM | POA: Diagnosis not present

## 2018-01-26 DIAGNOSIS — I69320 Aphasia following cerebral infarction: Secondary | ICD-10-CM | POA: Diagnosis not present

## 2018-01-26 DIAGNOSIS — C61 Malignant neoplasm of prostate: Secondary | ICD-10-CM | POA: Diagnosis not present

## 2018-01-26 DIAGNOSIS — E119 Type 2 diabetes mellitus without complications: Secondary | ICD-10-CM | POA: Diagnosis not present

## 2018-01-29 ENCOUNTER — Inpatient Hospital Stay (HOSPITAL_COMMUNITY)
Admission: EM | Admit: 2018-01-29 | Discharge: 2018-01-31 | DRG: 065 | Disposition: A | Discharge status: Home Health | Payer: Medicare Other | Attending: Internal Medicine | Admitting: Internal Medicine

## 2018-01-29 ENCOUNTER — Other Ambulatory Visit: Payer: Self-pay

## 2018-01-29 ENCOUNTER — Emergency Department (HOSPITAL_COMMUNITY): Payer: Medicare Other

## 2018-01-29 DIAGNOSIS — I63512 Cerebral infarction due to unspecified occlusion or stenosis of left middle cerebral artery: Principal | ICD-10-CM | POA: Diagnosis present

## 2018-01-29 DIAGNOSIS — Z7902 Long term (current) use of antithrombotics/antiplatelets: Secondary | ICD-10-CM

## 2018-01-29 DIAGNOSIS — Z833 Family history of diabetes mellitus: Secondary | ICD-10-CM

## 2018-01-29 DIAGNOSIS — Z882 Allergy status to sulfonamides status: Secondary | ICD-10-CM

## 2018-01-29 DIAGNOSIS — Z885 Allergy status to narcotic agent status: Secondary | ICD-10-CM

## 2018-01-29 DIAGNOSIS — E1151 Type 2 diabetes mellitus with diabetic peripheral angiopathy without gangrene: Secondary | ICD-10-CM | POA: Diagnosis present

## 2018-01-29 DIAGNOSIS — N183 Chronic kidney disease, stage 3 unspecified: Secondary | ICD-10-CM | POA: Diagnosis present

## 2018-01-29 DIAGNOSIS — Z800 Family history of malignant neoplasm of digestive organs: Secondary | ICD-10-CM

## 2018-01-29 DIAGNOSIS — C61 Malignant neoplasm of prostate: Secondary | ICD-10-CM | POA: Diagnosis present

## 2018-01-29 DIAGNOSIS — H8110 Benign paroxysmal vertigo, unspecified ear: Secondary | ICD-10-CM | POA: Diagnosis present

## 2018-01-29 DIAGNOSIS — Z823 Family history of stroke: Secondary | ICD-10-CM

## 2018-01-29 DIAGNOSIS — Z7984 Long term (current) use of oral hypoglycemic drugs: Secondary | ICD-10-CM

## 2018-01-29 DIAGNOSIS — I69351 Hemiplegia and hemiparesis following cerebral infarction affecting right dominant side: Secondary | ICD-10-CM

## 2018-01-29 DIAGNOSIS — I639 Cerebral infarction, unspecified: Secondary | ICD-10-CM | POA: Diagnosis not present

## 2018-01-29 DIAGNOSIS — Z8673 Personal history of transient ischemic attack (TIA), and cerebral infarction without residual deficits: Secondary | ICD-10-CM

## 2018-01-29 DIAGNOSIS — R42 Dizziness and giddiness: Secondary | ICD-10-CM

## 2018-01-29 DIAGNOSIS — E785 Hyperlipidemia, unspecified: Secondary | ICD-10-CM | POA: Diagnosis present

## 2018-01-29 DIAGNOSIS — R4789 Other speech disturbances: Secondary | ICD-10-CM | POA: Diagnosis not present

## 2018-01-29 DIAGNOSIS — I10 Essential (primary) hypertension: Secondary | ICD-10-CM | POA: Diagnosis not present

## 2018-01-29 DIAGNOSIS — Z9119 Patient's noncompliance with other medical treatment and regimen: Secondary | ICD-10-CM

## 2018-01-29 DIAGNOSIS — R1111 Vomiting without nausea: Secondary | ICD-10-CM | POA: Diagnosis not present

## 2018-01-29 DIAGNOSIS — Z9079 Acquired absence of other genital organ(s): Secondary | ICD-10-CM

## 2018-01-29 DIAGNOSIS — E1121 Type 2 diabetes mellitus with diabetic nephropathy: Secondary | ICD-10-CM

## 2018-01-29 DIAGNOSIS — E1129 Type 2 diabetes mellitus with other diabetic kidney complication: Secondary | ICD-10-CM | POA: Diagnosis present

## 2018-01-29 DIAGNOSIS — R112 Nausea with vomiting, unspecified: Secondary | ICD-10-CM | POA: Diagnosis not present

## 2018-01-29 DIAGNOSIS — E7800 Pure hypercholesterolemia, unspecified: Secondary | ICD-10-CM | POA: Diagnosis present

## 2018-01-29 DIAGNOSIS — Z886 Allergy status to analgesic agent status: Secondary | ICD-10-CM

## 2018-01-29 DIAGNOSIS — I69320 Aphasia following cerebral infarction: Secondary | ICD-10-CM

## 2018-01-29 DIAGNOSIS — Z7982 Long term (current) use of aspirin: Secondary | ICD-10-CM

## 2018-01-29 DIAGNOSIS — R270 Ataxia, unspecified: Secondary | ICD-10-CM | POA: Diagnosis not present

## 2018-01-29 DIAGNOSIS — Z9049 Acquired absence of other specified parts of digestive tract: Secondary | ICD-10-CM

## 2018-01-29 DIAGNOSIS — I129 Hypertensive chronic kidney disease with stage 1 through stage 4 chronic kidney disease, or unspecified chronic kidney disease: Secondary | ICD-10-CM | POA: Diagnosis not present

## 2018-01-29 DIAGNOSIS — K219 Gastro-esophageal reflux disease without esophagitis: Secondary | ICD-10-CM | POA: Diagnosis present

## 2018-01-29 DIAGNOSIS — E1122 Type 2 diabetes mellitus with diabetic chronic kidney disease: Secondary | ICD-10-CM | POA: Diagnosis present

## 2018-01-29 LAB — I-STAT CHEM 8, ED
BUN: 24 mg/dL — ABNORMAL HIGH (ref 8–23)
CALCIUM ION: 1.15 mmol/L (ref 1.15–1.40)
Chloride: 106 mmol/L (ref 98–111)
Creatinine, Ser: 1.1 mg/dL (ref 0.61–1.24)
GLUCOSE: 213 mg/dL — AB (ref 70–99)
HCT: 41 % (ref 39.0–52.0)
HEMOGLOBIN: 13.9 g/dL (ref 13.0–17.0)
POTASSIUM: 4.4 mmol/L (ref 3.5–5.1)
Sodium: 140 mmol/L (ref 135–145)
TCO2: 23 mmol/L (ref 22–32)

## 2018-01-29 LAB — DIFFERENTIAL
ABS IMMATURE GRANULOCYTES: 0.05 10*3/uL (ref 0.00–0.07)
BASOS ABS: 0.1 10*3/uL (ref 0.0–0.1)
BASOS PCT: 1 %
Eosinophils Absolute: 0.4 10*3/uL (ref 0.0–0.5)
Eosinophils Relative: 4 %
Immature Granulocytes: 1 %
LYMPHS PCT: 13 %
Lymphs Abs: 1.3 10*3/uL (ref 0.7–4.0)
Monocytes Absolute: 0.5 10*3/uL (ref 0.1–1.0)
Monocytes Relative: 5 %
NEUTROS ABS: 7.3 10*3/uL (ref 1.7–7.7)
NEUTROS PCT: 76 %

## 2018-01-29 LAB — CBC
HEMATOCRIT: 40.7 % (ref 39.0–52.0)
HEMOGLOBIN: 13.5 g/dL (ref 13.0–17.0)
MCH: 33 pg (ref 26.0–34.0)
MCHC: 33.2 g/dL (ref 30.0–36.0)
MCV: 99.5 fL (ref 80.0–100.0)
Platelets: 155 10*3/uL (ref 150–400)
RBC: 4.09 MIL/uL — AB (ref 4.22–5.81)
RDW: 13.1 % (ref 11.5–15.5)
WBC: 9.6 10*3/uL (ref 4.0–10.5)
nRBC: 0 % (ref 0.0–0.2)

## 2018-01-29 LAB — COMPREHENSIVE METABOLIC PANEL
ALK PHOS: 56 U/L (ref 38–126)
ALT: 46 U/L — ABNORMAL HIGH (ref 0–44)
ANION GAP: 6 (ref 5–15)
AST: 32 U/L (ref 15–41)
Albumin: 3.5 g/dL (ref 3.5–5.0)
BUN: 22 mg/dL (ref 8–23)
CALCIUM: 9 mg/dL (ref 8.9–10.3)
CO2: 22 mmol/L (ref 22–32)
Chloride: 110 mmol/L (ref 98–111)
Creatinine, Ser: 1.15 mg/dL (ref 0.61–1.24)
GFR calc non Af Amer: 57 mL/min — ABNORMAL LOW (ref >60)
Glucose, Bld: 214 mg/dL — ABNORMAL HIGH (ref 70–99)
Potassium: 4.4 mmol/L (ref 3.5–5.1)
SODIUM: 138 mmol/L (ref 135–145)
Total Bilirubin: 0.4 mg/dL (ref 0.3–1.2)
Total Protein: 6.6 g/dL (ref 6.5–8.1)

## 2018-01-29 LAB — APTT: APTT: 26 s (ref 24–36)

## 2018-01-29 LAB — I-STAT TROPONIN, ED: Troponin i, poc: 0 ng/mL (ref 0.00–0.08)

## 2018-01-29 LAB — CBG MONITORING, ED
GLUCOSE-CAPILLARY: 195 mg/dL — AB (ref 70–99)
Glucose-Capillary: 114 mg/dL — ABNORMAL HIGH (ref 70–99)
Glucose-Capillary: 151 mg/dL — ABNORMAL HIGH (ref 70–99)

## 2018-01-29 LAB — PROTIME-INR
INR: 0.99
Prothrombin Time: 13 seconds (ref 11.4–15.2)

## 2018-01-29 MED ORDER — ACETAMINOPHEN 325 MG PO TABS
650.0000 mg | ORAL_TABLET | ORAL | Status: DC | PRN
  Administered 2018-01-30 (×2): 650 mg via ORAL
  Filled 2018-01-29 (×2): qty 2

## 2018-01-29 MED ORDER — VITAMIN B-12 1000 MCG PO TABS
1000.0000 ug | ORAL_TABLET | Freq: Every day | ORAL | Status: DC
  Administered 2018-01-30 – 2018-01-31 (×2): 1000 ug via ORAL
  Filled 2018-01-29 (×3): qty 1

## 2018-01-29 MED ORDER — INSULIN ASPART 100 UNIT/ML ~~LOC~~ SOLN: 0.0000 [IU] | Freq: Every day | SUBCUTANEOUS | Status: DC

## 2018-01-29 MED ORDER — SODIUM CHLORIDE 0.9 % IV BOLUS
1000.0000 mL | Freq: Once | INTRAVENOUS | Status: AC
  Administered 2018-01-29: 1000 mL via INTRAVENOUS

## 2018-01-29 MED ORDER — ATORVASTATIN CALCIUM 40 MG PO TABS
40.0000 mg | ORAL_TABLET | Freq: Every day | ORAL | Status: DC
  Administered 2018-01-30 – 2018-01-31 (×2): 40 mg via ORAL
  Filled 2018-01-29 (×2): qty 1

## 2018-01-29 MED ORDER — MECLIZINE HCL 12.5 MG PO TABS: 25.0000 mg | ORAL_TABLET | Freq: Three times a day (TID) | ORAL | Status: DC | PRN

## 2018-01-29 MED ORDER — ACETAMINOPHEN 160 MG/5ML PO SOLN: 650.0000 mg | ORAL | Status: DC | PRN

## 2018-01-29 MED ORDER — ACETAMINOPHEN 650 MG RE SUPP: 650.0000 mg | RECTAL | Status: DC | PRN

## 2018-01-29 MED ORDER — SODIUM CHLORIDE 0.9 % IV SOLN
INTRAVENOUS | Status: DC
  Administered 2018-01-29 – 2018-01-31 (×3): via INTRAVENOUS

## 2018-01-29 MED ORDER — ZOLPIDEM TARTRATE 5 MG PO TABS
5.0000 mg | ORAL_TABLET | Freq: Every evening | ORAL | Status: DC | PRN
  Administered 2018-01-30: 5 mg via ORAL
  Filled 2018-01-29: qty 1

## 2018-01-29 MED ORDER — ENOXAPARIN SODIUM 40 MG/0.4ML ~~LOC~~ SOLN
40.0000 mg | SUBCUTANEOUS | Status: DC
  Administered 2018-01-30 (×2): 40 mg via SUBCUTANEOUS
  Filled 2018-01-29 (×2): qty 0.4

## 2018-01-29 MED ORDER — SENNOSIDES-DOCUSATE SODIUM 8.6-50 MG PO TABS: 1.0000 | ORAL_TABLET | Freq: Every evening | ORAL | Status: DC | PRN

## 2018-01-29 MED ORDER — ENOXAPARIN SODIUM 40 MG/0.4ML ~~LOC~~ SOLN: 40.0000 mg | SUBCUTANEOUS | Status: DC

## 2018-01-29 MED ORDER — STROKE: EARLY STAGES OF RECOVERY BOOK
Freq: Once | Status: DC
  Filled 2018-01-29: qty 1

## 2018-01-29 MED ORDER — MECLIZINE HCL 25 MG PO TABS: 25.0000 mg | ORAL_TABLET | Freq: Once | ORAL | Status: DC

## 2018-01-29 MED ORDER — INSULIN ASPART 100 UNIT/ML ~~LOC~~ SOLN
0.0000 [IU] | Freq: Three times a day (TID) | SUBCUTANEOUS | Status: DC
  Administered 2018-01-31 (×2): 1 [IU] via SUBCUTANEOUS

## 2018-01-29 NOTE — ED Notes (Signed)
Patient transported to CT 

## 2018-01-29 NOTE — ED Provider Notes (Signed)
Signed out from Dr. Venora Maples pending MRI.  MRI with no acute/subacute stroke.  Discussed with Dr. Cheral Marker who will consult on patient.  Will admit to hospitalist.   Julianne Rice, MD 01/29/18 [phone]

## 2018-01-29 NOTE — ED Provider Notes (Signed)
[facility] EMERGENCY DEPARTMENT Provider Note   CSN: [id number] Arrival date & time: 01/29/18  1000     History   Chief Complaint Chief Complaint  Patient presents with  . Dizziness    HPI Jeremiah Mccoy is a 84 y.o. male.  HPI Patient is 84 year old male who awoke this morning with vertiginous symptoms with associated room spinning and nausea and vomiting.  He said difficulty walking since then.  His family reports some slurred speech to the day as well.  Denies weakness of his arms or legs.  Patient is never had symptoms like this before.  Denies chest pain or shortness of breath.  No recent change in his medications.  No significant headache at this time.  His nausea and vomiting are improving but he still feels dizzy.  He reports movement to his right worsens his symptoms.   Past Medical History:  Diagnosis Date  . Cancer ([place])   . Depression   . Diabetes mellitus without complication ([place])   . Prostate cancer ([place])   . Stroke [facility])     Patient Active Problem List   Diagnosis Date Noted  . Hospital discharge follow-up 01/11/2018  . Thrombocytopenia ([place]) 01/05/2018  . AKI (acute kidney injury) ([place]) 01/05/2018  . Lightheadedness 01/04/2018  . Acute CVA (cerebrovascular accident) ([place]) 01/04/2018  . Recurrent prostate cancer ([place]) 10/24/2017  . Traumatic ecchymosis of abdominal wall 10/19/2017  . Macrocytosis without anemia 07/17/2017  . Macrocytosis 07/17/2017  . GERD (gastroesophageal reflux disease) 06/28/2017  . Chest pain 06/22/2017  . Elevated PSA, less than 10 ng/ml 04/17/2017  . Type 2 diabetes mellitus with complication, with long-term current use of insulin ([place]) 03/15/2017  . Elevated cholesterol 03/15/2017  . History of stroke 03/15/2017  . Non-compliance 03/15/2017  . History of prostate cancer 03/15/2017  . Continuous leakage of urine 03/15/2017  . Erectile dysfunction following prostate ablative therapy 03/15/2017    Past  Surgical History:  Procedure Laterality Date  . APPENDECTOMY    . CHOLECYSTECTOMY    . PROSTATECTOMY          Home Medications    Prior to Admission medications   Medication Sig Start Date End Date Taking? Authorizing Provider  aspirin EC 81 MG EC tablet Take 1 tablet (81 mg total) by mouth daily. 01/06/18  Yes Eulogio Bear U, DO  atorvastatin (LIPITOR) 40 MG tablet Take 1 tablet (40 mg total) by mouth daily at 6 PM. 01/11/18  Yes Libby Maw, MD  clopidogrel (PLAVIX) 75 MG tablet Take 1 tablet (75 mg total) by mouth daily. 01/11/18  Yes Libby Maw, MD  metFORMIN (GLUCOPHAGE) 500 MG tablet TAKE 1 TABLET BY MOUTH TWICE DAILY WITH MEAL Patient taking differently: Take 500 mg by mouth 2 (two) times daily with a meal. TAKE 1 TABLET BY MOUTH TWICE DAILY WITH MEAL 01/11/18  Yes Libby Maw, MD  vitamin B-12 1000 MCG tablet Take 1 tablet (1,000 mcg total) by mouth daily. 01/06/18  Yes Geradine Girt, DO    Family History Family History  Problem Relation Age of Onset  . Colon cancer Mother   . Stroke Father   . Diabetes Brother   . Breast cancer Neg Hx   . Prostate cancer Neg Hx   . Pancreatic cancer Neg Hx     Social History Social History   Tobacco Use  . Smoking status: Never Smoker  . Smokeless tobacco: Never Used  Substance Use Topics  . Alcohol use:  No    Frequency: Never  . Drug use: No     Allergies   Morphine and related; Motrin [ibuprofen]; and Sulfa antibiotics   Review of Systems Review of Systems  All other systems reviewed and are negative.    Physical Exam Updated Vital Signs BP (!) 145/72   Pulse (!) 43   Temp 97.7 F (36.5 C) (Oral)   Resp (!) 21   SpO2 94%   Physical Exam  Constitutional: He is oriented to person, place, and time. He appears well-developed and well-nourished.  HENT:  Head: Normocephalic and atraumatic.  Eyes: Pupils are equal, round, and reactive to light. EOM are normal.  Neck: Normal  range of motion.  Cardiovascular: Normal rate, regular rhythm, normal heart sounds and intact distal pulses.  Pulmonary/Chest: Effort normal and breath sounds normal. No respiratory distress.  Abdominal: Soft. He exhibits no distension. There is no tenderness.  Musculoskeletal: Normal range of motion.  Neurological: He is alert and oriented to person, place, and time.  5/5 strength in major muscle groups of  bilateral upper and lower extremities. Speech normal. No facial asymetry.   Skin: Skin is warm and dry.  Psychiatric: He has a normal mood and affect. Judgment normal.  Nursing note and vitals reviewed.    ED Treatments / Results  Labs (all labs ordered are listed, but only abnormal results are displayed) Labs Reviewed  CBC - Abnormal; Notable for the following components:      Result Value   RBC 4.09 (*)    All other components within normal limits  COMPREHENSIVE METABOLIC PANEL - Abnormal; Notable for the following components:   Glucose, Bld 214 (*)    ALT 46 (*)    GFR calc non Af Amer 57 (*)    All other components within normal limits  CBG MONITORING, ED - Abnormal; Notable for the following components:   Glucose-Capillary 195 (*)    All other components within normal limits  I-STAT CHEM 8, ED - Abnormal; Notable for the following components:   BUN 24 (*)    Glucose, Bld 213 (*)    All other components within normal limits  CBG MONITORING, ED - Abnormal; Notable for the following components:   Glucose-Capillary 114 (*)    All other components within normal limits  PROTIME-INR  APTT  DIFFERENTIAL  I-STAT TROPONIN, ED    EKG EKG Interpretation  Date/Time:  Monday January 29 2018 10:05:17 EST Ventricular Rate:  95 PR Interval:    QRS Duration: 94 QT Interval:  370 QTC Calculation: 464 R Axis:   3 Text Interpretation:  Normal sinus rhythm Low voltage QRS Incomplete right bundle branch block Abnormal ECG Artifact Confirmed by Jola Schmidt [phone]) on 01/29/2018  5:59:34 PM   Radiology Ct Head Wo Contrast  Result Date: 01/29/2018 CLINICAL DATA:  Dizziness beginning this morning with some nausea and vomiting. EXAM: CT HEAD WITHOUT CONTRAST TECHNIQUE: Contiguous axial images were obtained from the base of the skull through the vertex without intravenous contrast. COMPARISON:  01/04/2018 FINDINGS: Brain: The ventricles and cisterns are within normal. There is mild age related atrophy unchanged. There is moderate chronic ischemic microvascular disease. Small old right posterior parieto-occipital watershed infarct and small old right occipital infarct. No mass, mass effect, shift of midline structures or acute hemorrhage. No evidence of acute infarction. Vascular: No hyperdense vessel or unexpected calcification. Skull: Normal. Negative for fracture or focal lesion. Sinuses/Orbits: No acute finding. Other: None. IMPRESSION: No acute findings. Moderate chronic ischemic  microvascular disease and age related atrophy. Old posterior right infarcts as described. Electronically Signed   By: Marin Olp M.D.   On: 01/29/2018 11:16   MRI PENDING  Procedures Procedures (including critical care time)  Medications Ordered in ED Medications  sodium chloride 0.9 % bolus 1,000 mL (1,000 mLs Intravenous New Bag/Given 01/29/18 1730)  meclizine (ANTIVERT) tablet 25 mg (has no administration in time range)     Initial Impression / Assessment and Plan / ED Course  I have reviewed the triage vital signs and the nursing notes.  Pertinent labs & imaging results that were available during my care of the patient were reviewed by me and considered in my medical decision making (see chart for details).     Given his advanced age she will undergo MRI to evaluate for central vertigo.  Much of his clinical history however is more consistent with peripheral vertigo.  Meclizine now.  MRI pending.  Head CT without obvious abnormality.  Patient family updated.  Care to Dr Lita Mains  to follow up on MRI and symtom control  Final Clinical Impressions(s) / ED Diagnoses   Final diagnoses:  None    ED Discharge Orders    None       Jola Schmidt, MD 01/29/18 1800

## 2018-01-29 NOTE — ED Notes (Signed)
No neuro deficits noted at this time.

## 2018-01-29 NOTE — Consult Note (Signed)
Referring Physician: Dr. Blaine Hamper    Chief Complaint: Dizziness with N/V  HPI: Brayton Baumgartner is an 84 y.o. male who presented to the ED on Monday night with acute onset of room-spinning vertigo, which occurred when he turned over in his bed Monday morning. He became nauseated with vomiting after onset of the dizziness. The vertigo would worsen with rightward movement. Also had difficulty ambulating. He denied tinnitus. He denied limb weakness but family reported some slurred speech. No neurological deficits were noted on initial exam by ED staff. He denied headache.   MRI brain in the ED revealed a subcentimeter focus of restricted diffusion in the left centrum semiovale; this was diagnosed as a stroke on the Radiology report. On my review of the images, given chronic microvascular white matter changes, the DWI finding may represent a focus of new chronic microvascular ischemia; given its location in the deep white matter it appears unlikely to be due to embolic phenomenon.   He was hospitalized in early October for an acute stroke. At that time he was started on ASA and Plavix. The patient, who exhibits memory deficits on exam, states that he only takes ASA.    Past Medical History:  Diagnosis Date  . Cancer ([place])   . Depression   . Diabetes mellitus without complication ([place])   . Prostate cancer ([place])   . Stroke [facility])     Past Surgical History:  Procedure Laterality Date  . APPENDECTOMY    . CHOLECYSTECTOMY    . PROSTATECTOMY      Family History  Problem Relation Age of Onset  . Colon cancer Mother   . Stroke Father   . Diabetes Brother   . Breast cancer Neg Hx   . Prostate cancer Neg Hx   . Pancreatic cancer Neg Hx    Social History:  reports that he has never smoked. He has never used smokeless tobacco. He reports that he does not drink alcohol or use drugs.  Allergies:  Allergies  Allergen Reactions  . Morphine And Related Other (See Comments)  . Motrin [Ibuprofen] Other  (See Comments)    Dry heaves  . Sulfa Antibiotics Other (See Comments)    Dry heaves    Medications:  Prior to Admission:  Medications Prior to Admission  Medication Sig Dispense Refill Last Dose  . aspirin EC 81 MG EC tablet Take 1 tablet (81 mg total) by mouth daily. 14 tablet 0 01/28/2018 at Unknown time  . atorvastatin (LIPITOR) 40 MG tablet Take 1 tablet (40 mg total) by mouth daily at 6 PM. 30 tablet 2 01/28/2018 at Unknown time  . clopidogrel (PLAVIX) 75 MG tablet Take 1 tablet (75 mg total) by mouth daily. 30 tablet 2 01/28/2018 at Unknown time  . metFORMIN (GLUCOPHAGE) 500 MG tablet TAKE 1 TABLET BY MOUTH TWICE DAILY WITH MEAL (Patient taking differently: Take 500 mg by mouth 2 (two) times daily with a meal. TAKE 1 TABLET BY MOUTH TWICE DAILY WITH MEAL) 60 tablet 2 01/28/2018 at Unknown time  . vitamin B-12 1000 MCG tablet Take 1 tablet (1,000 mcg total) by mouth daily. 30 tablet 0 01/28/2018 at Unknown time   Scheduled: .  stroke: mapping our early stages of recovery book   Does not apply Once  . aspirin  81 mg Oral Daily  . atorvastatin  40 mg Oral q1800  . clopidogrel  75 mg Oral Daily  . enoxaparin (LOVENOX) injection  40 mg Subcutaneous Q24H  . insulin aspart  0-5  Units Subcutaneous QHS  . insulin aspart  0-9 Units Subcutaneous TID WC  . cyanocobalamin  1,000 mcg Oral Daily   Continuous: . sodium chloride 75 mL/hr at 01/29/18 2110    ROS: Definitive ROS unobtainable due to poor memory for recent events and tangential speech content. No CP or SOB. No cough or fever. Other ROS as per HPI.   Physical Examination: Blood pressure (!) 156/80, pulse 78, temperature 97.7 F (36.5 C), temperature source Oral, resp. rate 20, SpO2 98 %.  HEENT: [place]/AT Lungs: Respirations unlabored Ext: No edema  Neurologic Examination: Mental Status: Alert, poor orientation. Poor memory. Speech fluent with intact comprehension for commands and questions. Tangential answers to multiple questions.  Repetition and naming intact. Able to follow basic motor commands, including a 2 step directional command. Cranial Nerves: II:  Visual fields intact with no extinction to DSS. PERRL  III,IV, VI: No ptosis. EOMI with saccadic visual pursuits noted when gazing towards the left.  VIII: hearing intact to voice IX,X: No hypophonia or hoarseness XI: Symmetric XII: midline tongue extension  Motor: Right : Upper extremity   5/5    Left:     Upper extremity   5/5  Lower extremity   5/5     Lower extremity   5/5 Normal tone throughout; no atrophy noted Sensory: Temp and light touch intact throughout, bilaterally. No extinction.  Deep Tendon Reflexes:  2+ bilateral upper and lower extremities without asymmetry.  Cerebellar: No ataxia with FNF or H-S bilaterally Gait: Deferred    Results for orders placed or performed during the hospital encounter of 01/29/18 (from the past 48 hour(s))  Protime-INR     Status: None   Collection Time: 01/29/18 10:09 AM  Result Value Ref Range   Prothrombin Time 13.0 11.4 - 15.2 seconds   INR 0.99     Comment: Performed at [facility] [address], [place], [place] [address]  APTT     Status: None   Collection Time: 01/29/18 10:09 AM  Result Value Ref Range   aPTT 26 24 - 36 seconds    Comment: Performed at [facility] [address], [place], [place] [address]  CBC     Status: Abnormal   Collection Time: 01/29/18 10:09 AM  Result Value Ref Range   WBC 9.6 4.0 - 10.5 K/uL   RBC 4.09 (L) 4.22 - 5.81 MIL/uL   Hemoglobin 13.5 13.0 - 17.0 g/dL   HCT 40.7 39.0 - 52.0 %   MCV 99.5 80.0 - 100.0 fL   MCH 33.0 26.0 - 34.0 pg   MCHC 33.2 30.0 - 36.0 g/dL   RDW 13.1 11.5 - 15.5 %   Platelets 155 150 - 400 K/uL   nRBC 0.0 0.0 - 0.2 %    Comment: Performed at [facility], [place] [address] [place], [place] [address]  Differential     Status: None   Collection Time: 01/29/18 10:09 AM  Result Value Ref Range   Neutrophils Relative % 76 %    Neutro Abs 7.3 1.7 - 7.7 K/uL   Lymphocytes Relative 13 %   Lymphs Abs 1.3 0.7 - 4.0 K/uL   Monocytes Relative 5 %   Monocytes Absolute 0.5 0.1 - 1.0 K/uL   Eosinophils Relative 4 %   Eosinophils Absolute 0.4 0.0 - 0.5 K/uL   Basophils Relative 1 %   Basophils Absolute 0.1 0.0 - 0.1 K/uL   Immature Granulocytes 1 %   Abs  Immature Granulocytes 0.05 0.00 - 0.07 K/uL    Comment: Performed at [facility], [place] [address] [place], [place] [address]  Comprehensive metabolic panel     Status: Abnormal   Collection Time: 01/29/18 10:09 AM  Result Value Ref Range   Sodium 138 135 - 145 mmol/L   Potassium 4.4 3.5 - 5.1 mmol/L   Chloride 110 98 - 111 mmol/L   CO2 22 22 - 32 mmol/L   Glucose, Bld 214 (H) 70 - 99 mg/dL   BUN 22 8 - 23 mg/dL   Creatinine, Ser 1.15 0.61 - 1.24 mg/dL   Calcium 9.0 8.9 - 10.3 mg/dL   Total Protein 6.6 6.5 - 8.1 g/dL   Albumin 3.5 3.5 - 5.0 g/dL   AST 32 15 - 41 U/L   ALT 46 (H) 0 - 44 U/L   Alkaline Phosphatase 56 38 - 126 U/L   Total Bilirubin 0.4 0.3 - 1.2 mg/dL   GFR calc non Af Amer 57 (L) >60 mL/min   GFR calc Af Amer >60 >60 mL/min    Comment: (NOTE) The eGFR has been calculated using the CKD EPI equation. This calculation has not been validated in all clinical situations. eGFR's persistently <60 mL/min signify possible Chronic Kidney Disease.    Anion gap 6 5 - 15    Comment: Performed at [facility] [address] [place], [place] [address]  CBG monitoring, ED     Status: Abnormal   Collection Time: 01/29/18 11:23 AM  Result Value Ref Range   Glucose-Capillary 195 (H) 70 - 99 mg/dL  I-stat troponin, ED     Status: None   Collection Time: 01/29/18 11:25 AM  Result Value Ref Range   Troponin i, poc 0.00 0.00 - 0.08 ng/mL   Comment 3            Comment: Due to the release kinetics of cTnI, a negative result within the first hours of the onset of symptoms does not rule out myocardial infarction with certainty. If myocardial  infarction is still suspected, repeat the test at appropriate intervals.   I-Stat Chem 8, ED     Status: Abnormal   Collection Time: 01/29/18 11:26 AM  Result Value Ref Range   Sodium 140 135 - 145 mmol/L   Potassium 4.4 3.5 - 5.1 mmol/L   Chloride 106 98 - 111 mmol/L   BUN 24 (H) 8 - 23 mg/dL   Creatinine, Ser 1.10 0.61 - 1.24 mg/dL   Glucose, Bld 213 (H) 70 - 99 mg/dL   Calcium, Ion 1.15 1.15 - 1.40 mmol/L   TCO2 23 22 - 32 mmol/L   Hemoglobin 13.9 13.0 - 17.0 g/dL   HCT 41.0 39.0 - 52.0 %  CBG monitoring, ED     Status: Abnormal   Collection Time: 01/29/18  3:31 PM  Result Value Ref Range   Glucose-Capillary 114 (H) 70 - 99 mg/dL   Ct Head Wo Contrast  Result Date: 01/29/2018 CLINICAL DATA:  Dizziness beginning this morning with some nausea and vomiting. EXAM: CT HEAD WITHOUT CONTRAST TECHNIQUE: Contiguous axial images were obtained from the base of the skull through the vertex without intravenous contrast. COMPARISON:  01/04/2018 FINDINGS: Brain: The ventricles and cisterns are within normal. There is mild age related atrophy unchanged. There is moderate chronic ischemic microvascular disease. Small old right posterior parieto-occipital watershed infarct and small old right occipital infarct. No mass, mass effect, shift of midline structures or acute hemorrhage.  No evidence of acute infarction. Vascular: No hyperdense vessel or unexpected calcification. Skull: Normal. Negative for fracture or focal lesion. Sinuses/Orbits: No acute finding. Other: None. IMPRESSION: No acute findings. Moderate chronic ischemic microvascular disease and age related atrophy. Old posterior right infarcts as described. Electronically Signed   By: Marin Olp M.D.   On: 01/29/2018 11:16   Mr Brain Wo Contrast  Result Date: 01/29/2018 CLINICAL DATA:  84 y/o M; ataxia, stroke suspected. Speech difficulty. EXAM: MRI HEAD WITHOUT CONTRAST TECHNIQUE: Multiplanar, multiecho pulse sequences of the brain and  surrounding structures were obtained without intravenous contrast. COMPARISON:  12/29/2017 CT head.  01/04/2018 MRI head. FINDINGS: Brain: 5 mm focus of reduced diffusion within the left superior frontal centrum semiovale compatible with acute/early subacute infarction. No associated hemorrhage or mass effect. Multiple very small chronic infarctions are present within the right cerebellar hemisphere. There are small chronic cortical infarctions within the right occipital and parietal lobes as well as within the left watershed distribution and left lateral temporal lobe. There is hemosiderin staining of the cortical supratentorial infarctions. Additionally, there are numerous punctate foci of susceptibility hypointensity throughout the brain compatible with chronic microhemorrhage. Vascular: Normal central flow voids. Skull and upper cervical spine: Normal marrow signal. Sinuses/Orbits: Negative. Other: None. IMPRESSION: 1. 5 mm acute/early subacute infarction within the left superior frontal centrum semiovale. No associated hemorrhage or mass effect. 2. Multiple stable chronic infarctions as above. These results were called by telephone at the time of interpretation on 01/29/2018 at 7:15 pm to Dr. Lita Mains, who verbally acknowledged these results. Electronically Signed   By: Kristine Garbe M.D.   On: 01/29/2018 19:17   MRI brain: -- 5 mm focus of reduced diffusion within the left superior frontal centrum semiovale compatible with acute/early subacute infarction. No associated hemorrhage or mass effect.  -- Multiple very small chronic infarctions are present within the right cerebellar hemisphere. There are small chronic cortical infarctions within the right occipital and parietal lobes as well as within the left watershed distribution and left lateral temporal lobe. There is hemosiderin staining of the cortical supratentorial infarctions. Additionally, there are numerous punctate foci of  susceptibility hypointensity throughout the brain compatible with chronic microhemorrhage.  MRA HEAD: 1. Negative intracranial MRA. No large vessel occlusion. No hemodynamically significant or correctable stenosis. 2. Mild distal small vessel atheromatous irregularity.  MRA NECK: Negative MRA of the neck. No vascular occlusion or hemodynamically significant stenosis identified.  TTE 01/05/18:  Technically difficult study. LVEF 60-65%, modeate LVH,   incoordinate septal motion and IVS flattening (may suggest   elevated RV pressure), grade 1 DD, elevated LV filling pressure,   mild MR, normal LA size, mild TR, RVSP 40 mmHg + RAP, IVC was not   visualized, no pericardial effusion.  Assessment: 84 y.o. male presenting with acute vertigo.  1. MRI brain in the ED revealed a subcentimeter focus of restricted diffusion in the left centrum semiovale; this was diagnosed as a stroke on the Radiology report. On my review of the images, given chronic microvascular white matter changes, the DWI finding may represent a focus of new chronic microvascular ischemia; given its location in the deep white matter it appears unlikely to be due to embolic phenomenon.  2. MRA head and neck with no LVO or hemodynamically significant stenosis. Mild distal small vessel atheromatous irregularity is noted. 3. Neurological examination shows no lateralized weakness, sensory loss, facial droop, aphasia or visual field cut. Poor memory and tangential responses to several questions is noted.  4. Vertigo most likely to be secondary to peripheral vestibulopathy as MRI is negative for brainstem or cerebellar stroke. DDx includes Meniere's disease, BPPV and vestibular neuritis.  5. Stroke Risk Factors - DM2, prior stroke and cancer  Plan: 1. May benefit from outpatient ENT evaluation 2. Agree with trial of Meclizine 3. Continue ASA, Plavix and Lipitor 4. Telemetry monitoring 5. Frequent neuro checks 6. BP  management  _0  signed: Dr. Kerney Elbe 01/29/2018, 7:54 PM

## 2018-01-29 NOTE — H&P (Addendum)
History and Physical    Jeremiah Mccoy YFV:[id number] DOB: [birth year] DOA: 01/29/2018  Referring MD/NP/PA:   PCP: Libby Maw, MD   Patient coming from:  The patient is coming from home.  At baseline, pt is independent for most of ADL.        Chief Complaint: Dizziness  HPI: Jeremiah Mccoy is a 84 y.o. male with medical history significant of diabetes mellitus, hyperlipidemia, depression, prostate cancer (prostatectomy), CKD 3, recent stroke, who presents with dizziness.  Patient was recently hospitalized from 10/10-10/11 because of acute stroke.  Patient was started with aspirin and Plavix.  Patient states that he has been doing well on 2 this morning at about 9 AM, when he started feeling dizzy.  He feels like the room is spinning around him.  Denies unilateral weakness, numbness or tingling his extremities been no vision change or hearing loss.  No ear ringing.  Patient does not have chest pain, shortness breath, cough, fever or chills.  No nausea vomiting, diarrhea, abdominal pain, symptoms of UTI.  ED Course: pt was found to have WBC 9.6, stable renal function, negative troponin, temperature normal, heart rate 43-1 05, oxygen satting 94% on room air.  CT head is negative.  MRI for brain showed 5 mm acute/early subacute infarction within the left superior frontal centrum semiovale. [name] placed on telemetry bed for observation.  Neurology, Dr. Cheral Marker was consulted.   Review of Systems:   General: no fevers, chills, no body weight gain, has poor appetite, has fatigue HEENT: no blurry vision, hearing changes or sore throat Respiratory: no dyspnea, coughing, wheezing CV: no chest pain, no palpitations GI: no nausea, vomiting, abdominal pain, diarrhea, constipation GU: no dysuria, burning on urination, increased urinary frequency, hematuria  Ext: no leg edema Neuro: no unilateral weakness, numbness, or tingling, no vision change or hearing loss Skin: no rash, no skin  tear. MSK: No muscle spasm, no deformity, no limitation of range of movement in spin Heme: No easy bruising.  Travel history: No recent long distant travel.  Allergy:  Allergies  Allergen Reactions  . Morphine And Related Other (See Comments)  . Motrin [Ibuprofen] Other (See Comments)    Dry heaves  . Sulfa Antibiotics Other (See Comments)    Dry heaves    Past Medical History:  Diagnosis Date  . Cancer ([place])   . Depression   . Diabetes mellitus without complication ([place])   . Prostate cancer ([place])   . Stroke [facility])     Past Surgical History:  Procedure Laterality Date  . APPENDECTOMY    . CHOLECYSTECTOMY    . PROSTATECTOMY      Social History:  reports that he has never smoked. He has never used smokeless tobacco. He reports that he does not drink alcohol or use drugs.  Family History:  Family History  Problem Relation Age of Onset  . Colon cancer Mother   . Stroke Father   . Diabetes Brother   . Breast cancer Neg Hx   . Prostate cancer Neg Hx   . Pancreatic cancer Neg Hx      Prior to Admission medications   Medication Sig Start Date End Date Taking? Authorizing Provider  aspirin EC 81 MG EC tablet Take 1 tablet (81 mg total) by mouth daily. 01/06/18  Yes Eulogio Bear U, DO  atorvastatin (LIPITOR) 40 MG tablet Take 1 tablet (40 mg total) by mouth daily at 6 PM. 01/11/18  Yes Libby Maw, MD  clopidogrel (PLAVIX) 75  MG tablet Take 1 tablet (75 mg total) by mouth daily. 01/11/18  Yes Libby Maw, MD  metFORMIN (GLUCOPHAGE) 500 MG tablet TAKE 1 TABLET BY MOUTH TWICE DAILY WITH MEAL Patient taking differently: Take 500 mg by mouth 2 (two) times daily with a meal. TAKE 1 TABLET BY MOUTH TWICE DAILY WITH MEAL 01/11/18  Yes Libby Maw, MD  vitamin B-12 1000 MCG tablet Take 1 tablet (1,000 mcg total) by mouth daily. 01/06/18  Yes Geradine Girt, DO    Physical Exam: Vitals:   01/29/18 1600 01/29/18 1927 01/29/18 1931 01/29/18 1945   BP: (!) 145/72  (!) 156/80 (!) 157/81  Pulse: (!) 43 74 78 64  Resp: (!) 21 16 20 10   Temp:      TempSrc:      SpO2: 94% 100% 98% 98%   General: Not in acute distress HEENT:       Eyes: PERRL, EOMI, no scleral icterus.       ENT: No discharge from the ears and nose, no pharynx injection, no tonsillar enlargement.        Neck: No JVD, no bruit, no mass felt. Heme: No neck lymph node enlargement. Cardiac: S1/S2, RRR, No murmurs, No gallops or rubs. Respiratory: Good air movement bilaterally. No rales, wheezing, rhonchi or rubs. GI: Soft, nondistended, nontender, no rebound pain, no organomegaly, BS present. GU: No hematuria Ext: No pitting leg edema bilaterally. 2+DP/PT pulse bilaterally. Musculoskeletal: No joint deformities, No joint redness or warmth, no limitation of ROM in spin. Skin: No rashes.  Neuro: Alert, oriented X3, cranial nerves II-XII grossly intact, moves all extremities normally. Muscle strength 5/5 in all extremities, sensation to light touch intact. Brachial reflex 2+ bilaterally. Knee reflex 1+ bilaterally. Negative Babinski's sign. Normal finger to nose test. Psych: Patient is not psychotic, no suicidal or hemocidal ideation.  Labs on Admission: I have personally reviewed following labs and imaging studies  CBC: Recent Labs  Lab 01/29/18 1009 01/29/18 1126  WBC 9.6  --   NEUTROABS 7.3  --   HGB 13.5 13.9  HCT 40.7 41.0  MCV 99.5  --   PLT 155  --    Basic Metabolic Panel: Recent Labs  Lab 01/29/18 1009 01/29/18 1126  NA 138 140  K 4.4 4.4  CL 110 106  CO2 22  --   GLUCOSE 214* 213*  BUN 22 24*  CREATININE 1.15 1.10  CALCIUM 9.0  --    GFR: CrCl cannot be calculated (Unknown ideal weight.). Liver Function Tests: Recent Labs  Lab 01/29/18 1009  AST 32  ALT 46*  ALKPHOS 56  BILITOT 0.4  PROT 6.6  ALBUMIN 3.5   No results for input(s): LIPASE, AMYLASE in the last 168 hours. No results for input(s): AMMONIA in the last 168  hours. Coagulation Profile: Recent Labs  Lab 01/29/18 1009  INR 0.99   Cardiac Enzymes: No results for input(s): CKTOTAL, CKMB, CKMBINDEX, TROPONINI in the last 168 hours. BNP (last 3 results) No results for input(s): PROBNP in the last 8760 hours. HbA1C: No results for input(s): HGBA1C in the last 72 hours. CBG: Recent Labs  Lab 01/29/18 1123 01/29/18 1531  GLUCAP 195* 114*   Lipid Profile: No results for input(s): CHOL, HDL, LDLCALC, TRIG, CHOLHDL, LDLDIRECT in the last 72 hours. Thyroid Function Tests: No results for input(s): TSH, T4TOTAL, FREET4, T3FREE, THYROIDAB in the last 72 hours. Anemia Panel: No results for input(s): VITAMINB12, FOLATE, FERRITIN, TIBC, IRON, RETICCTPCT in the last 72  hours. Urine analysis:    Component Value Date/Time   COLORURINE YELLOW 03/16/2017 [place] 03/16/2017 1353   LABSPEC 1.020 03/16/2017 1353   PHURINE 5.5 03/16/2017 1353   GLUCOSEU NEGATIVE 03/16/2017 1353   HGBUR NEGATIVE 03/16/2017 1353   BILIRUBINUR NEGATIVE 03/16/2017 1353   KETONESUR NEGATIVE 03/16/2017 1353   UROBILINOGEN 0.2 03/16/2017 1353   NITRITE NEGATIVE 03/16/2017 1353   LEUKOCYTESUR NEGATIVE 03/16/2017 1353   Sepsis Labs: @LABRCNTIP (procalcitonin:4,lacticidven:4) )No results found for this or any previous visit (from the past 240 hour(s)).   Radiological Exams on Admission: Ct Head Wo Contrast  Result Date: 01/29/2018 CLINICAL DATA:  Dizziness beginning this morning with some nausea and vomiting. EXAM: CT HEAD WITHOUT CONTRAST TECHNIQUE: Contiguous axial images were obtained from the base of the skull through the vertex without intravenous contrast. COMPARISON:  01/04/2018 FINDINGS: Brain: The ventricles and cisterns are within normal. There is mild age related atrophy unchanged. There is moderate chronic ischemic microvascular disease. Small old right posterior parieto-occipital watershed infarct and small old right occipital infarct. No mass,  mass effect, shift of midline structures or acute hemorrhage. No evidence of acute infarction. Vascular: No hyperdense vessel or unexpected calcification. Skull: Normal. Negative for fracture or focal lesion. Sinuses/Orbits: No acute finding. Other: None. IMPRESSION: No acute findings. Moderate chronic ischemic microvascular disease and age related atrophy. Old posterior right infarcts as described. Electronically Signed   By: Marin Olp M.D.   On: 01/29/2018 11:16   Mr Brain Wo Contrast  Result Date: 01/29/2018 CLINICAL DATA:  84 y/o M; ataxia, stroke suspected. Speech difficulty. EXAM: MRI HEAD WITHOUT CONTRAST TECHNIQUE: Multiplanar, multiecho pulse sequences of the brain and surrounding structures were obtained without intravenous contrast. COMPARISON:  12/29/2017 CT head.  01/04/2018 MRI head. FINDINGS: Brain: 5 mm focus of reduced diffusion within the left superior frontal centrum semiovale compatible with acute/early subacute infarction. No associated hemorrhage or mass effect. Multiple very small chronic infarctions are present within the right cerebellar hemisphere. There are small chronic cortical infarctions within the right occipital and parietal lobes as well as within the left watershed distribution and left lateral temporal lobe. There is hemosiderin staining of the cortical supratentorial infarctions. Additionally, there are numerous punctate foci of susceptibility hypointensity throughout the brain compatible with chronic microhemorrhage. Vascular: Normal central flow voids. Skull and upper cervical spine: Normal marrow signal. Sinuses/Orbits: Negative. Other: None. IMPRESSION: 1. 5 mm acute/early subacute infarction within the left superior frontal centrum semiovale. No associated hemorrhage or mass effect. 2. Multiple stable chronic infarctions as above. These results were called by telephone at the time of interpretation on 01/29/2018 at 7:15 pm to Dr. Lita Mains, who verbally acknowledged  these results. Electronically Signed   By: Kristine Garbe M.D.   On: 01/29/2018 19:17     EKG: Independently reviewed. Has artifical effect, difficult to tell the underlying rhythm, but regular, QTC 464, low voltage, Q waves in lead III  Assessment/Plan Principal Problem:   Stroke [facility]) Active Problems:   Type II diabetes mellitus with renal manifestations (HCC)   HLD (hyperlipidemia)   Prostate cancer (HCC)   CKD (chronic kidney disease), stage III ([place])   Stroke [facility]): this is recurrent issue. In recent admission from 10/10-10/11, MRI showed apunctateacute white matter infarct in the anterior right frontal lobe. It was not sure if that was embolic stroke or small vessel disease. MRA headH&N was negative. 2D Echo showed EF 60-65%. B LE venous dopplers was neg for DVT. HgbA1c- 7.2 and LDL112.  [name] on Lipitor.  And was discharged on ASA 81 mg daily and Plavix 75 mg daily. Pt was supposed to continue DAPT for 3 weeks and then plavix alone.   -will place on tele bed for obs - will follow up Neurology's Recs.  - swallowing screen. If fails, will get SLP - PT/OT consult  Abnormal heart rate: pt has abnormal heart rate today.  Patient's heart rate is between 40s to 100s in ED. EKG showed some artifical effect, difficult to tell the underlying rhythm, but regular, QTC 464, low voltage, Q waves in lead II. Pt may have underlying flutter? If yes. Will need blood thinner -tele monitoring now -will repeat EKG in AM -May need Holter monitor or loop recorder  Dizziness: Most likely due to stroke. DD include peripheral vertigo. -PT/OT - IV fluid: 1 L normal saline, then 75 cc/h -prn meclizine  Type II diabetes mellitus with renal manifestations ([place]): Last A1c 7.2 on 01/05/18, not well controled. Patient is taking Metformin at home -SSI  HLD (hyperlipidemia):  -lipitor  Hx of Prostate cancer [facility]): s/p of prostatectomy.  PSA was 0.52 on 07/17/2017 -No acute issues.  CKD  (chronic kidney disease), stage III ([place]): Stable.  Creatinine 1.10, BUN 24. -Follow-up renal function by BMP -IV fluid as above   DVT ppx: SQ Lovenox Code Status: Full code Family Communication: None at bed side.  Disposition Plan:  Anticipate discharge back to previous home environment Consults called:  Dr. Cheral Marker of neuro Admission status: Obs / tele     Date of Service 01/29/2018    Ivor Costa [facility] Pager [phone]  If 7PM-7AM, please contact night-coverage [email or web address] Password [email or web address] 01/29/2018, 8:22 PM

## 2018-01-29 NOTE — ED Notes (Signed)
Patient transported to MRI 

## 2018-01-29 NOTE — ED Triage Notes (Signed)
Pt to ER for evaluation of dizziness onset when he turned over in bed this morning, after that he became nauseated and began vomiting. Pt in NAD at this time. Reports some dizziness at this time. A/o x4.

## 2018-01-30 ENCOUNTER — Other Ambulatory Visit: Payer: Self-pay

## 2018-01-30 ENCOUNTER — Encounter (HOSPITAL_COMMUNITY): Payer: Self-pay | Admitting: *Deleted

## 2018-01-30 DIAGNOSIS — Z7902 Long term (current) use of antithrombotics/antiplatelets: Secondary | ICD-10-CM | POA: Diagnosis not present

## 2018-01-30 DIAGNOSIS — I69320 Aphasia following cerebral infarction: Secondary | ICD-10-CM | POA: Diagnosis not present

## 2018-01-30 DIAGNOSIS — Z9079 Acquired absence of other genital organ(s): Secondary | ICD-10-CM | POA: Diagnosis not present

## 2018-01-30 DIAGNOSIS — Z886 Allergy status to analgesic agent status: Secondary | ICD-10-CM | POA: Diagnosis not present

## 2018-01-30 DIAGNOSIS — Z833 Family history of diabetes mellitus: Secondary | ICD-10-CM | POA: Diagnosis not present

## 2018-01-30 DIAGNOSIS — Z9119 Patient's noncompliance with other medical treatment and regimen: Secondary | ICD-10-CM | POA: Diagnosis not present

## 2018-01-30 DIAGNOSIS — Z7984 Long term (current) use of oral hypoglycemic drugs: Secondary | ICD-10-CM | POA: Diagnosis not present

## 2018-01-30 DIAGNOSIS — I69351 Hemiplegia and hemiparesis following cerebral infarction affecting right dominant side: Secondary | ICD-10-CM | POA: Diagnosis not present

## 2018-01-30 DIAGNOSIS — Z800 Family history of malignant neoplasm of digestive organs: Secondary | ICD-10-CM | POA: Diagnosis not present

## 2018-01-30 DIAGNOSIS — N183 Chronic kidney disease, stage 3 (moderate): Secondary | ICD-10-CM | POA: Diagnosis present

## 2018-01-30 DIAGNOSIS — E7800 Pure hypercholesterolemia, unspecified: Secondary | ICD-10-CM | POA: Diagnosis present

## 2018-01-30 DIAGNOSIS — I63312 Cerebral infarction due to thrombosis of left middle cerebral artery: Secondary | ICD-10-CM | POA: Diagnosis not present

## 2018-01-30 DIAGNOSIS — C61 Malignant neoplasm of prostate: Secondary | ICD-10-CM | POA: Diagnosis present

## 2018-01-30 DIAGNOSIS — I129 Hypertensive chronic kidney disease with stage 1 through stage 4 chronic kidney disease, or unspecified chronic kidney disease: Secondary | ICD-10-CM | POA: Diagnosis present

## 2018-01-30 DIAGNOSIS — E1151 Type 2 diabetes mellitus with diabetic peripheral angiopathy without gangrene: Secondary | ICD-10-CM | POA: Diagnosis present

## 2018-01-30 DIAGNOSIS — E1122 Type 2 diabetes mellitus with diabetic chronic kidney disease: Secondary | ICD-10-CM | POA: Diagnosis present

## 2018-01-30 DIAGNOSIS — E785 Hyperlipidemia, unspecified: Secondary | ICD-10-CM | POA: Diagnosis present

## 2018-01-30 DIAGNOSIS — Z885 Allergy status to narcotic agent status: Secondary | ICD-10-CM | POA: Diagnosis not present

## 2018-01-30 DIAGNOSIS — K219 Gastro-esophageal reflux disease without esophagitis: Secondary | ICD-10-CM | POA: Diagnosis present

## 2018-01-30 DIAGNOSIS — I63512 Cerebral infarction due to unspecified occlusion or stenosis of left middle cerebral artery: Secondary | ICD-10-CM | POA: Diagnosis present

## 2018-01-30 DIAGNOSIS — Z7982 Long term (current) use of aspirin: Secondary | ICD-10-CM | POA: Diagnosis not present

## 2018-01-30 DIAGNOSIS — Z9049 Acquired absence of other specified parts of digestive tract: Secondary | ICD-10-CM | POA: Diagnosis not present

## 2018-01-30 DIAGNOSIS — I639 Cerebral infarction, unspecified: Secondary | ICD-10-CM | POA: Diagnosis not present

## 2018-01-30 DIAGNOSIS — Z823 Family history of stroke: Secondary | ICD-10-CM | POA: Diagnosis not present

## 2018-01-30 DIAGNOSIS — Z882 Allergy status to sulfonamides status: Secondary | ICD-10-CM | POA: Diagnosis not present

## 2018-01-30 DIAGNOSIS — H8110 Benign paroxysmal vertigo, unspecified ear: Secondary | ICD-10-CM | POA: Diagnosis present

## 2018-01-30 LAB — GLUCOSE, CAPILLARY
GLUCOSE-CAPILLARY: 103 mg/dL — AB (ref 70–99)
Glucose-Capillary: 124 mg/dL — ABNORMAL HIGH (ref 70–99)
Glucose-Capillary: 170 mg/dL — ABNORMAL HIGH (ref 70–99)
Glucose-Capillary: 119 mg/dL — ABNORMAL HIGH (ref 70–99)

## 2018-01-30 LAB — PSA: Prostatic Specific Antigen: 0.02 ng/mL (ref 0.00–4.00)

## 2018-01-30 MED ORDER — ASPIRIN 81 MG PO CHEW
81.0000 mg | CHEWABLE_TABLET | Freq: Every day | ORAL | Status: DC
  Administered 2018-01-30 – 2018-01-31 (×2): 81 mg via ORAL
  Filled 2018-01-30 (×2): qty 1

## 2018-01-30 MED ORDER — IOHEXOL 300 MG/ML  SOLN: 30.0000 mL | Freq: Once | INTRAMUSCULAR | Status: DC | PRN

## 2018-01-30 MED ORDER — CLOPIDOGREL BISULFATE 75 MG PO TABS
75.0000 mg | ORAL_TABLET | Freq: Every day | ORAL | Status: DC
  Administered 2018-01-30 – 2018-01-31 (×2): 75 mg via ORAL
  Filled 2018-01-30 (×3): qty 1

## 2018-01-30 NOTE — Care Management Note (Addendum)
Case Management Note  Patient Details  Name: [name] MRN: [id number] Date of Birth: 07-Oct-1933  Subjective/Objective:    Pt admitted with a stroke. He is from home with his spouse. [facility] is active with [facility] services.      DME: walker, cane, transfer chair, lift chair Denies issues with obtaining meds. Denies issues with transportation.  Has talked with [facility] about aides services at home.             Action/Plan: Recommendations are for [facility] services. Wife was hoping for a rehab stay prior to returning home. Wife is exhausted with caring for Mr O[place]ur[place] at home. CM provided her information on an ALF liaison for potential placement vs respite stay.  CM also provided her information on [facility]. CM following.  Expected Discharge Date:                  Expected Discharge Plan:  [place]  In-House Referral:     Discharge planning Services  CM Consult  Post Acute Care Choice:    Choice offered to:     DME Arranged:    DME Agency:     HH Arranged:    [place] Agency:     Status of Service:  In process, will continue to follow  If discussed at Long Length of Stay Meetings, dates discussed:    Additional Comments:  Pollie Friar, RN 01/30/2018, 2:03 PM

## 2018-01-30 NOTE — Evaluation (Signed)
Occupational Therapy Evaluation Patient Details Name: Jeremiah Mccoy MRN: [id number] DOB: 1933-08-03 Today's Date: 01/30/2018    History of Present Illness  [name] a 84 y.o. male with medical history significant of diabetes mellitus, hyperlipidemia, depression, prostate cancer (prostatectomy), CKD 3, recent stroke, who presents with dizziness. MRI brain in the ED revealed a subcentimeter focus of restricted diffusion in the left centrum semiovale, per neurology unlikely to be due to embolic phenomenon.    Clinical Impression   PTA patient reports using cane for mobility and independent for most ADLs (a little help with LB dressing and supervision for tub transfers), limited IADLs and spouse assist with med mgmt.  Pt currently admitted for above and limited by cognition, decreased activity tolerance, impaired balance and decreased safety. Pt currently requires min assist for LB dressing, min guard to close supervision for mobility, and supervision for transfers.  Patient will benefit from continued OT services while admitted and continued HHOT after dc in order to maximize return to PLOF.  Will continue to follow.     Follow Up Recommendations  Home health OT;Supervision/Assistance - 24 hour    Equipment Recommendations  None recommended by OT    Recommendations for Other Services PT consult     Precautions / Restrictions Precautions Precautions: Fall Precaution Comments: hx falls  Restrictions Weight Bearing Restrictions: No      Mobility Bed Mobility Overal bed mobility: Modified Independent             General bed mobility comments: no assist required  Transfers Overall transfer level: Needs assistance Equipment used: Rolling walker (2 wheeled) Transfers: Sit to/from Stand Sit to Stand: Supervision         General transfer comment: supervision for safety     Balance Overall balance assessment: Needs assistance Sitting-balance support: No upper extremity  supported Sitting balance-Leahy Scale: Good     Standing balance support: During functional activity;Bilateral upper extremity supported Standing balance-Leahy Scale: Fair Standing balance comment: able to maintain static standing with supervision, limited dynamic                            ADL either performed or assessed with clinical judgement   ADL Overall ADL's : Needs assistance/impaired     Grooming: Wash/dry hands;Standing;Supervision/safety   Upper Body Bathing: Supervision/ safety;Sitting   Lower Body Bathing: Supervison/ safety;Sit to/from stand   Upper Body Dressing : Set up;Supervision/safety;Sitting   Lower Body Dressing: Minimal assistance;Sit to/from stand Lower Body Dressing Details (indicate cue type and reason): min assist to don socks, pt reports "condom cath" in the way  Toilet Transfer: Supervision/safety;Ambulation;RW(simulated to recliner )           Functional mobility during ADLs: Supervision/safety;Min guard;Cueing for safety;Rolling walker(supervision to min guard for safety at time s) General ADL Comments: pt limited by cognition, attention and memory      Vision Baseline Vision/History: Wears glasses Wears Glasses: At all times Patient Visual Report: No change from baseline Vision Assessment?: No apparent visual deficits     Perception     Praxis      Pertinent Vitals/Pain Pain Assessment: No/denies pain     Hand Dominance Right   Extremity/Trunk Assessment Upper Extremity Assessment Upper Extremity Assessment: Overall WFL for tasks assessed   Lower Extremity Assessment Lower Extremity Assessment: Defer to PT evaluation   Cervical / Trunk Assessment Cervical / Trunk Assessment: Kyphotic   Communication Communication Communication: No difficulties   Cognition Arousal/Alertness:  Awake/alert Behavior During Therapy: [facility] for tasks assessed/performed;Impulsive Overall Cognitive Status: History of cognitive  impairments - at baseline                                 General Comments: recent admission, family not available but spouse report in 10/19 admission baseline    General Comments       Exercises     Shoulder Instructions      Home Living Family/patient expects to be discharged to:: Private residence Living Arrangements: Spouse/significant other Available Help at Discharge: Family Type of Home: Apartment Home Access: Stairs to enter Technical brewer of Steps: 1 Entrance Stairs-Rails: None Home Layout: One level     Bathroom Shower/Tub: Teacher, early years/pre: [place]: Environmental consultant - 2 wheels;Cane - single point;Tub bench          Prior Functioning/Environment Level of Independence: Independent with assistive device(s)        Comments: pt reports using cane for mobility, independent most ADLs but requires supervision for tub transfers and assist for LB dressing. pt reports spouse assist with med mgmt and IADLs        OT Problem List: Decreased strength;Decreased activity tolerance;Impaired balance (sitting and/or standing);Decreased safety awareness;Decreased knowledge of use of DME or AE;Decreased cognition      OT Treatment/Interventions: Self-care/ADL training;Therapeutic exercise;DME and/or AE instruction;Therapeutic activities;Cognitive remediation/compensation;Patient/family education;Balance training    OT Goals(Current goals can be found in the care plan section) Acute Rehab OT Goals Patient Stated Goal: to go home OT Goal Formulation: With patient Time For Goal Achievement: 02/13/18 Potential to Achieve Goals: Good  OT Frequency: Min 2X/week   Barriers to D/C:            Co-evaluation              AM-PAC PT "6 Clicks" Daily Activity     Outcome Measure Help from another person eating meals?: None Help from another person taking care of personal grooming?: A Little Help from another person  toileting, which includes using toliet, bedpan, or urinal?: A Little Help from another person bathing (including washing, rinsing, drying)?: A Little Help from another person to put on and taking off regular upper body clothing?: A Little Help from another person to put on and taking off regular lower body clothing?: A Little 6 Click Score: 19   End of Session Equipment Utilized During Treatment: Rolling walker Nurse Communication: Mobility status  Activity Tolerance: Patient tolerated treatment well Patient left: in chair;with call bell/phone within reach;with chair alarm set  OT Visit Diagnosis: Unsteadiness on feet (R26.81);Cognitive communication deficit (R41.841) Symptoms and signs involving cognitive functions: Cerebral infarction                Time: 7517-0017 OT Time Calculation (min): 19 min Charges:  OT General Charges $OT Visit: 1 Visit OT Evaluation $OT Eval Moderate Complexity: [place], OT Acute Rehabilitation Services Pager [phone] Office [phone]   Delight Stare 01/30/2018, 9:43 AM

## 2018-01-30 NOTE — Progress Notes (Addendum)
STROKE TEAM PROGRESS NOTE   INTERVAL HISTORY His wife is at the bedside.  She states his prostate cancer has recurred and he is receiving hormone injections for it.  She states he has been difficult to care for at home.  While he has been receiving home health therapy, he still needs a lot of help from her.  He remains incontinent in the home at times.  She finds it frustrating as he will "not listen to her ".  Patient awake, talkative and mostly agreeing with what wife says.  Vitals:   01/30/18 0200 01/30/18 0327 01/30/18 0736 01/30/18 1207  BP: (!) 153/82 (!) 152/81 (!) 165/100 (!) 147/76  Pulse: 69 66 84 62  Resp:  20 19 19   Temp:  (!) 97.5 F (36.4 C) (!) 97.5 F (36.4 C) (!) 97.5 F (36.4 C)  TempSrc:  Oral Oral Oral  SpO2: 96% 94% 97% 100%  Weight:  91.5 kg    Height:  5\' 11"  (1.803 m)      CBC:  Recent Labs  Lab 01/29/18 1009 01/29/18 1126  WBC 9.6  --   NEUTROABS 7.3  --   HGB 13.5 13.9  HCT 40.7 41.0  MCV 99.5  --   PLT 155  --     Basic Metabolic Panel:  Recent Labs  Lab 01/29/18 1009 01/29/18 1126  NA 138 140  K 4.4 4.4  CL 110 106  CO2 22  --   GLUCOSE 214* 213*  BUN 22 24*  CREATININE 1.15 1.10  CALCIUM 9.0  --    Lipid Panel:     Component Value Date/Time   CHOL 178 01/05/2018 0217   TRIG 142 01/05/2018 0217   HDL 38 (L) 01/05/2018 0217   CHOLHDL 4.7 01/05/2018 0217   VLDL 28 01/05/2018 0217   LDLCALC 112 (H) 01/05/2018 0217   HgbA1c:  Lab Results  Component Value Date   HGBA1C 7.2 (H) 01/05/2018   Urine Drug Screen: No results found for: LABOPIA, COCAINSCRNUR, LABBENZ, AMPHETMU, THCU, LABBARB  Alcohol Level No results found for: ETH  IMAGING Ct Head Wo Contrast  Result Date: 01/29/2018 CLINICAL DATA:  Dizziness beginning this morning with some nausea and vomiting. EXAM: CT HEAD WITHOUT CONTRAST TECHNIQUE: Contiguous axial images were obtained from the base of the skull through the vertex without intravenous contrast. COMPARISON:   01/04/2018 FINDINGS: Brain: The ventricles and cisterns are within normal. There is mild age related atrophy unchanged. There is moderate chronic ischemic microvascular disease. Small old right posterior parieto-occipital watershed infarct and small old right occipital infarct. No mass, mass effect, shift of midline structures or acute hemorrhage. No evidence of acute infarction. Vascular: No hyperdense vessel or unexpected calcification. Skull: Normal. Negative for fracture or focal lesion. Sinuses/Orbits: No acute finding. Other: None. IMPRESSION: No acute findings. Moderate chronic ischemic microvascular disease and age related atrophy. Old posterior right infarcts as described. Electronically Signed   By: Marin Olp M.D.   On: 01/29/2018 11:16   Mr Brain Wo Contrast  Result Date: 01/29/2018 CLINICAL DATA:  83 y/o M; ataxia, stroke suspected. Speech difficulty. EXAM: MRI HEAD WITHOUT CONTRAST TECHNIQUE: Multiplanar, multiecho pulse sequences of the brain and surrounding structures were obtained without intravenous contrast. COMPARISON:  12/29/2017 CT head.  01/04/2018 MRI head. FINDINGS: Brain: 5 mm focus of reduced diffusion within the left superior frontal centrum semiovale compatible with acute/early subacute infarction. No associated hemorrhage or mass effect. Multiple very small chronic infarctions are present within the right cerebellar  hemisphere. There are small chronic cortical infarctions within the right occipital and parietal lobes as well as within the left watershed distribution and left lateral temporal lobe. There is hemosiderin staining of the cortical supratentorial infarctions. Additionally, there are numerous punctate foci of susceptibility hypointensity throughout the brain compatible with chronic microhemorrhage. Vascular: Normal central flow voids. Skull and upper cervical spine: Normal marrow signal. Sinuses/Orbits: Negative. Other: None. IMPRESSION: 1. 5 mm acute/early subacute  infarction within the left superior frontal centrum semiovale. No associated hemorrhage or mass effect. 2. Multiple stable chronic infarctions as above. These results were called by telephone at the time of interpretation on 01/29/2018 at 7:15 pm to Dr. Lita Mains, who verbally acknowledged these results. Electronically Signed   By: Kristine Garbe M.D.   On: 01/29/2018 19:17    PHYSICAL EXAM HEENT: [place]/AT Lungs: Respirations unlabored Ext: No edema  Neurologic Examination: Mental Status: Alert, poor orientation. Poor memory. Speech fluent with intact comprehension for commands and questions. Tangential answers to multiple questions. Repetition and naming intact. Able to follow basic motor commands, including a 3 step directional command. Cranial Nerves: II:  Visual fields intact with no extinction to DSS. PERRL  III,IV, VI: No ptosis. EOMI VIII: hearing intact to voice IX,X: No hypophonia or hoarseness XI: mild R facial weakness XII: midline tongue extension  Motor: Right :  Upper extremity   5/5                                      Left:     Upper extremity   5/5             Lower extremity   5/5                                                  Lower extremity   5/5 Normal tone throughout; no atrophy noted Sensory: Temp and light touch intact throughout, bilaterally. No extinction.  Deep Tendon Reflexes:  2+ bilateral upper and lower extremities without asymmetry.  Cerebellar: No ataxia with FNF or H-S bilaterally Gait: Deferred  ASSESSMENT/PLAN [name] is a 84 y.o. male with history of hypertension, diabetes, prostate cancer, stroke in 2018 with residual right-sided paresthesias and aphasia, with another stroke in October 2019 still getting home therapy, short-term memory problems and medical noncompliance presenting with dizziness, nausea and vomiting.   BPPV  Patient symptoms with getting up and rolling in bed in certain position causing vertigo  Vertigo or  short lasting  Consistent with posterior and horizontal semi-canal BPPV  Dix-Hallpike maneuver showed mild vertigo on right turning, consistent with right horizontal semi-canal BPPV  Recommend PT follow-up for vestibular rehab and Epley maneuver  Incidental stroke:   Punctate left centrum semiovale/corona radiata white matter infarct secondary to small vessel disease.  Current stroke not etiology of presenting symptoms. History of prior strokes, likely embolic, source unknown.  CT head No acute stroke. Moderate small vessel disease. Atrophy. Old posterior R infarcts.   MRI small acute/subacute left superior frontal centrum semiovale infarct.  Stable old infarcts.  No need to repeat stroke work-up just completed in October of this year   Given history of prostate cancer and concern for possible embolic source of strokes, will do pan CT of chest abdomen and  pelvis looking for possible metastatic source  Lovenox 40 mg sq daily for VTE prophylaxis  aspirin 81 mg daily and clopidogrel 75 mg daily prior to admission (had been ordered Plavix alone prior to stroke in October but was not taking), now on aspirin 81 mg daily and clopidogrel 75 mg daily x 3 weeks then aspirin alone  Therapy recommendations:  HH PT and OT  Disposition:  pending   Hypertension  Hx Orthostatic hypotension during last adsmission  Stable . BP goal normotensive  Hyperlipidemia  Home meds:  lipitor 40, resumed in hospital -Lipitor recently started during October admission  LDL 112, goal < 70  Continue statin at discharge  Diabetes type II with renal manifestations  HgbA1c 7.2, goal < 7.0  Noncompliant with diet and medications prior to stroke in October  Uncontrolled  Hx of stroke  11/2016 - right arm weakness with dysarthria - consider stroke at OSH - resolved in 3 hr  07/2017 - expressive aphasia - lasting 59min, resolved 30d monitor recommended  12/2017 R frontal punctate infarct.   CT head  showed old right MCA, MCA/PCA, and left parietal infarcts  Given the recurrent stroke like episodes and old infarcts on CT/MRI, recommend 30 day cardiac event monitoring to rule out afib as outpt. Followed by Dr. Ellyn Hack  Other Stroke Risk Factors  Advanced age  Family hx stroke (father)  Other Active Problems  Baseline memory deficit   Hx prostate cancer. PSA 0.02.  Currently undergoing hormone injections per wife  Medical noncompliance  Low B12 put on oral replacement in October  Chronic kidney disease stage III  Hospital day # 0  Burnetta Sabin, MSN, APRN, ANVP-BC, AGPCNP-BC Advanced Practice Stroke Nurse [place] for Schedule & Pager information 01/30/2018 3:48 PM   ATTENDING NOTE: I reviewed above note and agree with the assessment and plan. Pt was seen and examined.   Patient wife at bedside.  Patient and the wife recounted HPI with me.  He stated that he was trying to get out of from bed yesterday and had sudden onset vertigo and he laid back down, also turning on the right in bed making vertigo worse so that he kept himself on the left lying position.  After 1 hour he got up with EMS without problem.  Symptoms consistent with BPPV.  I also did Dix-Hallpike maneuver it showed on the right turning he had mild vertigo but no significant nystagmus, indicating he may have right horizontal canal BPPV.  Recommend physical therapy working with patient for vestibular rehab and teaching him for Epley maneuver to do at home by himself.  Patient does have a punctate left semiovale restriction diffusion on DWI.  In 12/2017 he was admitted for right frontal punctate infarct MRA head and neck negative.  DVT negative.  EF 60 to 65%.  LDL 112 and A1c 7.2.  He was put on DAPT for 3 weeks as well as Lipitor 40.  He was found also had orthostatic hypotension and was treated with fluid and TED hose.  He has in the past in 11/2016 of right side arm weakness and slurred  speech resolved in 3 hours.  In 07/2017 he had aphasia for 50 minutes and CT showed old right MCA and the left parietal as well as right MCA PCA old infarcts.  Was recommended 30-day cardio event monitoring with his cardiologist Dr. Ellyn Hack, but that was not done.  Taken together, although stroke location more likely small vessel disease, however  cardioembolic source cannot be ruled out due to multiple vessel territory.  Given history of prostate cancer, although PSA this time 0.02, discussed with patient and would like to perform pan CT to rule out advanced cancer.  We also recommend outpatient 30-day Cardio event monitoring to rule out A. Fib.  Continue aspirin Plavix for 3 weeks and then aspirin alone.  Continue Lipitor for stroke prevention.  Will follow  Rosalin Hawking, MD PhD Stroke Neurology 01/30/2018 4:53 PM     To contact Stroke Continuity provider, please refer to [email or web address] After hours, contact General Neurology

## 2018-01-30 NOTE — Evaluation (Signed)
Speech Language Pathology Evaluation Patient Details Name: Jeremiah Mccoy MRN: [id number] DOB: 08/10/1933 Today's Date: 01/30/2018 Time: 3818-2993 SLP Time Calculation (min) (ACUTE ONLY): 10 min  Problem List:  Patient Active Problem List   Diagnosis Date Noted  . Acute ischemic left MCA stroke ([place]) 01/30/2018  . Type II diabetes mellitus with renal manifestations ([place]) 01/29/2018  . HLD (hyperlipidemia) 01/29/2018  . Prostate cancer ([place]) 01/29/2018  . CKD (chronic kidney disease), stage III ([place]) 01/29/2018  . Dizziness 01/29/2018  . Hospital discharge follow-up 01/11/2018  . Thrombocytopenia ([place]) 01/05/2018  . AKI (acute kidney injury) ([place]) 01/05/2018  . Lightheadedness 01/04/2018  . Stroke ([place]) 01/04/2018  . Recurrent prostate cancer ([place]) 10/24/2017  . Traumatic ecchymosis of abdominal wall 10/19/2017  . Macrocytosis without anemia 07/17/2017  . Macrocytosis 07/17/2017  . GERD (gastroesophageal reflux disease) 06/28/2017  . Chest pain 06/22/2017  . Elevated PSA, less than 10 ng/ml 04/17/2017  . Type 2 diabetes mellitus with complication, with long-term current use of insulin ([place]) 03/15/2017  . Elevated cholesterol 03/15/2017  . History of stroke 03/15/2017  . Non-compliance 03/15/2017  . History of prostate cancer 03/15/2017  . Continuous leakage of urine 03/15/2017  . Erectile dysfunction following prostate ablative therapy 03/15/2017   Past Medical History:  Past Medical History:  Diagnosis Date  . Cancer ([place])   . Depression   . Diabetes mellitus without complication ([place])   . Prostate cancer ([place])   . Stroke [facility])    Past Surgical History:  Past Surgical History:  Procedure Laterality Date  . APPENDECTOMY    . CHOLECYSTECTOMY    . PROSTATECTOMY     HPI:  [name] a 84 y.o. male with medical history significant of diabetes mellitus, hyperlipidemia, depression, prostate cancer (prostatectomy), CKD 3, recent stroke, who presents with dizziness. MRI brain in  the ED revealed a subcentimeter focus of restricted diffusion in the left centrum semiovale, per neurology unlikely to be due to embolic phenomenon.    Assessment / Plan / Recommendation Clinical Impression   Pt presents with mild-moderate deficits in the areas of sustained attention, anticipatory awareness, safety awareness, and recall.  Pt reports having significant difficulty with short term memory at baseline.  Pt also reports that wife managed all household responsibilities at baseline.  No family was present to verify baseline but pt does not seem significantly altered from reports of his baseline from this acute hospitalization or recent admission last month.  Will follow along acutely to verify baseline information with pt's family and provide treatment and education as appropriate.      SLP Assessment  SLP Recommendation/Assessment: Patient needs continued Speech Lanaguage Pathology Services SLP Visit Diagnosis: Cognitive communication deficit (R41.841)    Follow Up Recommendations  None    Frequency and Duration min 1 x/week         SLP Evaluation Cognition  Overall Cognitive Status: History of cognitive impairments - at baseline Arousal/Alertness: Awake/alert Orientation Level: Oriented X4 Attention: Sustained Sustained Attention: Impaired Sustained Attention Impairment: Verbal basic Memory: Impaired Memory Impairment: Retrieval deficit Awareness: Impaired Awareness Impairment: Anticipatory impairment Behaviors: Impulsive Safety/Judgment: Impaired       Comprehension  Auditory Comprehension Overall Auditory Comprehension: Appears within functional limits for tasks assessed    Expression Expression Primary Mode of Expression: Verbal Verbal Expression Overall Verbal Expression: Appears within functional limits for tasks assessed Written Expression Dominant Hand: Right   Oral / Motor  Oral Motor/Sensory Function Overall Oral Motor/Sensory Function: Within  functional limits Motor Speech Overall  Motor Speech: Appears within functional limits for tasks assessed   GO                    Martinez Boxx, Selinda Orion 01/30/2018, 11:22 AM

## 2018-01-30 NOTE — H&P (Signed)
History and Physical    Jeremiah Mccoy ZOX:[id number] DOB: 01-05-1934 DOA: 01/29/2018  PCP: Libby Maw, MD   Patient coming from: Home  I have personally briefly reviewed patient's old medical records in [place]  Chief Complaint: Dizziness  HPI: Jeremiah Mccoy is a 84 y.o. male with medical history significant of diabetes mellitus, hyperlipidemia, depression, prostate cancer (prostatectomy), CKD 3, recent stroke, who presents with dizziness.  The patient was recently hospitalized from October 10 to October 11 due to an acute stroke.  He was started on aspirin and Plavix.  He is been compliant with his medications and was doing well until 9 AM on 4 November he developed some dizziness.  The room was spinning around him but he did not have any unilateral weakness, numbness or tingling.  He had no vision change or hearing loss and no ringing in his ears.  He denies any chest pain shortness of breath, cough, fever or chills.  He has no associated nausea, vomiting, diarrhea, abdominal pain or UTI symptoms.  Patient was placed in observation and an MRI was obtained which showed a 5 mm acute early subacute infarction in the left superior frontal centrum semiovale consistent with a left MCA stroke.  Patient is being admitted for further evaluation and management of acute stroke.  After [place] feels that the stroke is due to ischemia and not due to an embolic phenomenon.  Dr. Cheral Marker also felt that the patient had peripheral vestibulopathy causing his vertigo because his MRI did not show a brainstem or cerebellar stroke.  Review of Systems: As per HPI otherwise all other systems reviewed and  negative.    Past Medical History:  Diagnosis Date  . Cancer ([place])   . Depression   . Diabetes mellitus without complication ([place])   . Prostate cancer ([place])   . Stroke [facility])     Past Surgical History:  Procedure Laterality Date  . APPENDECTOMY    . CHOLECYSTECTOMY    . PROSTATECTOMY       Social History   Social History Narrative   Married for 57 years (2019). Moved to [place] in November 2018 to be closer to their daughter. Patient from [facility]. 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      reports that he has never smoked. He has never used smokeless tobacco. He reports that he does not drink alcohol or use drugs.  Allergies  Allergen Reactions  . Morphine And Related Other (See Comments)  . Motrin [Ibuprofen] Other (See Comments)    Dry heaves  . Sulfa Antibiotics Other (See Comments)    Dry heaves    Family History  Problem Relation Age of Onset  . Colon cancer Mother   . Stroke Father   . Diabetes Brother   . Breast cancer Neg Hx   . Prostate cancer Neg Hx   . Pancreatic cancer Neg Hx     Prior to Admission medications   Medication Sig Start Date End Date Taking? Authorizing Provider  aspirin EC 81 MG EC tablet Take 1 tablet (81 mg total) by mouth daily. 01/06/18  Yes Eulogio Bear U, DO  atorvastatin (LIPITOR) 40 MG tablet Take 1 tablet (40 mg total) by mouth daily at 6 PM. 01/11/18  Yes Libby Maw, MD  clopidogrel (PLAVIX) 75 MG tablet Take 1 tablet (75 mg total) by mouth daily. 01/11/18  Yes Libby Maw, MD  metFORMIN (GLUCOPHAGE) 500 MG tablet TAKE 1 TABLET BY MOUTH TWICE DAILY WITH  MEAL Patient taking differently: Take 500 mg by mouth 2 (two) times daily with a meal. TAKE 1 TABLET BY MOUTH TWICE DAILY WITH MEAL 01/11/18  Yes Libby Maw, MD  vitamin B-12 1000 MCG tablet Take 1 tablet (1,000 mcg total) by mouth daily. 01/06/18  Yes Geradine Girt, DO    Physical Exam:  Constitutional: NAD, calm, comfortable Vitals:   01/29/18 2300 01/30/18 0200 01/30/18 0327 01/30/18 0736  BP: (!) 167/92 (!) 153/82 (!) 152/81 (!) 165/100  Pulse: 75 69 66 84  Resp: (!) 24  20 19   Temp:   (!) 97.5 F (36.4 C) (!) 97.5 F (36.4 C)  TempSrc:   Oral Oral  SpO2: 98% 96% 94% 97%  Weight:   91.5 kg   Height:   5\' 11"   (1.803 m)    Eyes: PERRL, lids and conjunctivae normal ENMT: Mucous membranes are moist. Posterior pharynx clear of any exudate or lesions.Normal dentition.  Neck: normal, supple, no masses, no thyromegaly Respiratory: clear to auscultation bilaterally, no wheezing, no crackles. Normal respiratory effort. No accessory muscle use.  Cardiovascular: Regular rate and rhythm, no murmurs / rubs / gallops. No extremity edema. 2+ pedal pulses. No carotid bruits.  Abdomen: no tenderness, no masses palpated. No hepatosplenomegaly. Bowel sounds positive.  Musculoskeletal: no clubbing / cyanosis. No joint deformity upper and lower extremities. Good ROM, no contractures. Normal muscle tone.  Skin: no rashes, lesions, ulcers. No induration Neurologic: CN 2-12 grossly intact. Sensation intact, DTR normal. Strength 5/5 in all 4.  Psychiatric: Normal judgment and insight. Alert and oriented x 3. Normal mood.    Labs on Admission: I have personally reviewed following labs and imaging studies  CBC: Recent Labs  Lab 01/29/18 1009 01/29/18 1126  WBC 9.6  --   NEUTROABS 7.3  --   HGB 13.5 13.9  HCT 40.7 41.0  MCV 99.5  --   PLT 155  --    Basic Metabolic Panel: Recent Labs  Lab 01/29/18 1009 01/29/18 1126  NA 138 140  K 4.4 4.4  CL 110 106  CO2 22  --   GLUCOSE 214* 213*  BUN 22 24*  CREATININE 1.15 1.10  CALCIUM 9.0  --    GFR: Estimated Creatinine Clearance: 59.9 mL/min (by C-G formula based on SCr of 1.1 mg/dL). Liver Function Tests: Recent Labs  Lab 01/29/18 1009  AST 32  ALT 46*  ALKPHOS 56  BILITOT 0.4  PROT 6.6  ALBUMIN 3.5   Coagulation Profile: Recent Labs  Lab 01/29/18 1009  INR 0.99   CBG: Recent Labs  Lab 01/29/18 1123 01/29/18 1531 01/29/18 2219 01/30/18 0616  GLUCAP 195* 114* 151* 124*   Urine analysis:    Component Value Date/Time   COLORURINE YELLOW 03/16/2017 [place] 03/16/2017 1353   LABSPEC 1.020 03/16/2017 1353   PHURINE 5.5  03/16/2017 1353   GLUCOSEU NEGATIVE 03/16/2017 1353   HGBUR NEGATIVE 03/16/2017 1353   BILIRUBINUR NEGATIVE 03/16/2017 1353   KETONESUR NEGATIVE 03/16/2017 1353   UROBILINOGEN 0.2 03/16/2017 1353   NITRITE NEGATIVE 03/16/2017 1353   LEUKOCYTESUR NEGATIVE 03/16/2017 1353    Radiological Exams on Admission: Ct Head Wo Contrast  Result Date: 01/29/2018 CLINICAL DATA:  Dizziness beginning this morning with some nausea and vomiting. EXAM: CT HEAD WITHOUT CONTRAST TECHNIQUE: Contiguous axial images were obtained from the base of the skull through the vertex without intravenous contrast. COMPARISON:  01/04/2018 FINDINGS: Brain: The ventricles and cisterns are within normal. There is  mild age related atrophy unchanged. There is moderate chronic ischemic microvascular disease. Small old right posterior parieto-occipital watershed infarct and small old right occipital infarct. No mass, mass effect, shift of midline structures or acute hemorrhage. No evidence of acute infarction. Vascular: No hyperdense vessel or unexpected calcification. Skull: Normal. Negative for fracture or focal lesion. Sinuses/Orbits: No acute finding. Other: None. IMPRESSION: No acute findings. Moderate chronic ischemic microvascular disease and age related atrophy. Old posterior right infarcts as described. Electronically Signed   By: Marin Olp M.D.   On: 01/29/2018 11:16   Mr Brain Wo Contrast  Result Date: 01/29/2018 CLINICAL DATA:  84 y/o M; ataxia, stroke suspected. Speech difficulty. EXAM: MRI HEAD WITHOUT CONTRAST TECHNIQUE: Multiplanar, multiecho pulse sequences of the brain and surrounding structures were obtained without intravenous contrast. COMPARISON:  12/29/2017 CT head.  01/04/2018 MRI head. FINDINGS: Brain: 5 mm focus of reduced diffusion within the left superior frontal centrum semiovale compatible with acute/early subacute infarction. No associated hemorrhage or mass effect. Multiple very small chronic infarctions  are present within the right cerebellar hemisphere. There are small chronic cortical infarctions within the right occipital and parietal lobes as well as within the left watershed distribution and left lateral temporal lobe. There is hemosiderin staining of the cortical supratentorial infarctions. Additionally, there are numerous punctate foci of susceptibility hypointensity throughout the brain compatible with chronic microhemorrhage. Vascular: Normal central flow voids. Skull and upper cervical spine: Normal marrow signal. Sinuses/Orbits: Negative. Other: None. IMPRESSION: 1. 5 mm acute/early subacute infarction within the left superior frontal centrum semiovale. No associated hemorrhage or mass effect. 2. Multiple stable chronic infarctions as above. These results were called by telephone at the time of interpretation on 01/29/2018 at 7:15 pm to Dr. Lita Mains, who verbally acknowledged these results. Electronically Signed   By: Kristine Garbe M.D.   On: 01/29/2018 19:17    EKG: Independently reviewed.  Shows accelerated junctional rhythm and an incomplete right bundle blanch block with voltage QRS.  Assessment/Plan Principal Problem:   Stroke [facility]) Active Problems:   Type II diabetes mellitus with renal manifestations (HCC)   HLD (hyperlipidemia)   Prostate cancer (HCC)   CKD (chronic kidney disease), stage III (HCC)   Dizziness   Acute ischemic left MCA stroke (HCC)  Left middle cerebral artery stroke [facility]): this is recurrent issue. In recent admission from 10/10-10/11, MRI showed apunctateacute white matter infarct in the anterior right frontal lobe. It was not sure if that was embolic stroke or small vessel disease. MRA headH&N was negative. 2D Echo showed EF 60-65%. B LE venous dopplers was neg for DVT. HgbA1c- 7.2 and LDL112. [name] on Lipitor.  And was discharged on ASA 81 mg daily and Plavix 75 mg daily. Pt was supposed to continue DAPT for 3 weeks and then plavix alone.    Dr. Cheral Marker does not feel that this event is due to embolus either.  He feels it is more likely an ischemic problem.  -2 acute stroke will convert from observation to inpatient. - will follow up Neurology's Recs.  -Complaining of difficulty speaking.  We will get speech therapy to see the patient. - PT/OT consult  Abnormal heart rate: Continue telemetry monitoring.  Heart rate seem to have stabilized in the 60-70 range.  We will continue to monitor.  He may be a patient who suffers from nocturnal bradycardia. -EKG this morning as above -May need Holter monitor or loop recorder  Dizziness: Most likely due to stroke. DD include peripheral vertigo. -PT  patient pending - Occupational Therapy feels the patient would be best served by home health occupational therapy and 24/7 supervision and assistance - IV fluid: 1 L normal saline, then 75 cc/h -prn meclizine  Type II diabetes mellitus with renal manifestations ([place]): Last A1c 7.2 on 01/05/18, not well controled. Patient is taking Metformin at home -SSI  HLD (hyperlipidemia):  -lipitor  Hx of Prostate cancer [facility]): s/p of prostatectomy.  PSA was 0.52 on 07/17/2017 -No acute issues.  CKD (chronic kidney disease), stage III ([place]): Stable.  Creatinine 1.10, BUN 24. -Atony and is stable -IV fluid as above   DVT ppx: SQ Lovenox Code Status: Full code Family Communication: None at bed side.  Disposition Plan:  Anticipate discharge back home with continued assistance from his spouse Consults called:  Dr. Cheral Marker of neuro Admission status: Obs / tele     Lady Deutscher MD [place] [facility] Pager [phone]  If 7PM-7AM, please contact night-coverage [email or web address] Password [email or web address]  01/30/2018, 9:53 AM

## 2018-01-30 NOTE — Evaluation (Signed)
Physical Therapy Evaluation Patient Details Name: Jeremiah Mccoy MRN: [id number] DOB: Mar 14, 1934 Today's Date: 01/30/2018   History of Present Illness   [name] a 84 y.o. male with medical history significant of diabetes mellitus, hyperlipidemia, depression, prostate cancer (prostatectomy), CKD 3, recent stroke, who presents with dizziness. MRI brain in the ED revealed a subcentimeter focus of restricted diffusion in the left centrum semiovale, per neurology unlikely to be due to embolic phenomenon.   Clinical Impression  Pt admitted with/for dizziness and found to have a new stroke on the left.  Pt's is tentative and mildly unsteady with gait and static standing..  Pt currently limited functionally due to the problems listed below.  (see problems list.)  Pt will benefit from PT to maximize function and safety to be able to get home safely with available assist.     Follow Up Recommendations Home health PT;Supervision/Assistance - 24 hour    Equipment Recommendations  None recommended by PT    Recommendations for Other Services       Precautions / Restrictions Precautions Precautions: Fall Precaution Comments: hx falls  Restrictions Weight Bearing Restrictions: No      Mobility  Bed Mobility                  Transfers Overall transfer level: Needs assistance Equipment used: Rolling walker (2 wheeled) Transfers: Sit to/from Stand Sit to Stand: Supervision         General transfer comment: supervision for safety   Ambulation/Gait Ambulation/Gait assistance: Min guard Gait Distance (Feet): 200 Feet Assistive device: None;Straight cane Gait Pattern/deviations: Step-through pattern   Gait velocity interpretation: <1.8 ft/sec, indicate of risk for recurrent falls General Gait Details: tentative gait overall, mildly more steady with use of cane.  Pt loses focus in his conversation and this doesn't help with balance any.  Stairs            Wheelchair Mobility     Modified Rankin (Stroke Patients Only) Modified Rankin (Stroke Patients Only) Pre-Morbid Rankin Score: No significant disability Modified Rankin: Moderate disability     Balance Overall balance assessment: Needs assistance Sitting-balance support: No upper extremity supported Sitting balance-Leahy Scale: Good     Standing balance support: During functional activity;Bilateral upper extremity supported Standing balance-Leahy Scale: Fair Standing balance comment: able to maintain static standing with supervision, limited dynamic                              Pertinent Vitals/Pain Pain Assessment: No/denies pain    Home Living Family/patient expects to be discharged to:: Private residence Living Arrangements: Spouse/significant other Available Help at Discharge: Family Type of Home: Apartment Home Access: Stairs to enter Entrance Stairs-Rails: None Entrance Stairs-Number of Steps: 1 Home Layout: One level Home Equipment: Environmental consultant - 2 wheels;Cane - single point;Tub bench      Prior Function Level of Independence: Independent with assistive device(s)         Comments: pt reports using cane for mobility, independent most ADLs but requires supervision for tub transfers and assist for LB dressing. pt reports spouse assist with med mgmt and IADLs     Hand Dominance   Dominant Hand: Right    Extremity/Trunk Assessment   Upper Extremity Assessment Upper Extremity Assessment: Overall WFL for tasks assessed    Lower Extremity Assessment Lower Extremity Assessment: Overall WFL for tasks assessed    Cervical / Trunk Assessment Cervical / Trunk Assessment: Kyphotic  Communication  Communication: No difficulties  Cognition Arousal/Alertness: Awake/alert Behavior During Therapy: WFL for tasks assessed/performed;Impulsive Overall Cognitive Status: History of cognitive impairments - at baseline                                 General Comments:  recent admission, family not available but spouse report in 10/19 admission baseline       General Comments      Exercises     Assessment/Plan    PT Assessment Patient needs continued PT services  PT Problem List Decreased activity tolerance;Decreased balance;Decreased mobility;Decreased coordination;Decreased knowledge of use of DME       PT Treatment Interventions Gait training;Functional mobility training;Therapeutic activities;DME instruction;Balance training;Patient/family education    PT Goals (Current goals can be found in the Care Plan section)  Acute Rehab PT Goals Patient Stated Goal: to go home PT Goal Formulation: With patient Time For Goal Achievement: 02/06/18 Potential to Achieve Goals: Good    Frequency Min 3X/week   Barriers to discharge        Co-evaluation               AM-PAC PT "6 Clicks" Daily Activity  Outcome Measure Difficulty turning over in bed (including adjusting bedclothes, sheets and blankets)?: None Difficulty moving from lying on back to sitting on the side of the bed? : None Difficulty sitting down on and standing up from a chair with arms (e.g., wheelchair, bedside commode, etc,.)?: None Help needed moving to and from a bed to chair (including a wheelchair)?: A Little Help needed walking in hospital room?: A Little Help needed climbing 3-5 steps with a railing? : A Little 6 Click Score: 21    End of Session   Activity Tolerance: Patient tolerated treatment well Patient left: in chair;with call bell/phone within reach;with chair alarm set Nurse Communication: Mobility status PT Visit Diagnosis: Unsteadiness on feet (R26.81);Other symptoms and signs involving the nervous system (R29.898)    Time: 2956-2130 PT Time Calculation (min) (ACUTE ONLY): 25 min   Charges:   PT Evaluation $PT Eval Low Complexity: 1 Low PT Treatments $Gait Training: 8-22 mins        01/30/2018  Donnella Sham, PT Acute Rehabilitation  Services [phone]  (pager) [phone]  (office)  Tessie Fass Janaiya [place]ne 01/30/2018, 1:48 PM

## 2018-01-31 ENCOUNTER — Other Ambulatory Visit: Payer: Self-pay | Admitting: Cardiology

## 2018-01-31 ENCOUNTER — Inpatient Hospital Stay (HOSPITAL_COMMUNITY): Payer: Medicare Other

## 2018-01-31 DIAGNOSIS — I63312 Cerebral infarction due to thrombosis of left middle cerebral artery: Secondary | ICD-10-CM

## 2018-01-31 DIAGNOSIS — I639 Cerebral infarction, unspecified: Secondary | ICD-10-CM

## 2018-01-31 LAB — GLUCOSE, CAPILLARY
GLUCOSE-CAPILLARY: 136 mg/dL — AB (ref 70–99)
Glucose-Capillary: 114 mg/dL — ABNORMAL HIGH (ref 70–99)
Glucose-Capillary: 138 mg/dL — ABNORMAL HIGH (ref 70–99)

## 2018-01-31 MED ORDER — ASPIRIN 81 MG PO TBEC: 81.0000 mg | DELAYED_RELEASE_TABLET | Freq: Every day | ORAL | 3 refills | Status: DC

## 2018-01-31 MED ORDER — CLOPIDOGREL BISULFATE 75 MG PO TABS: 75.0000 mg | ORAL_TABLET | Freq: Every day | ORAL | 0 refills | Status: DC

## 2018-01-31 MED ORDER — IOHEXOL 300 MG/ML  SOLN
100.0000 mL | Freq: Once | INTRAMUSCULAR | Status: AC | PRN
  Administered 2018-01-31: 100 mL via INTRAVENOUS

## 2018-01-31 MED ORDER — MECLIZINE HCL 25 MG PO TABS: 25.0000 mg | ORAL_TABLET | Freq: Three times a day (TID) | ORAL | 0 refills | Status: DC | PRN

## 2018-01-31 NOTE — Care Management Note (Signed)
Case Management Note  Patient Details  Name: Jeremiah Mccoy MRN: [id number] Date of Birth: Aug 19, 1933  Subjective/Objective:                    Action/Plan: Pt discharging home with resumption of [place] services. Pt was active with [facility] prior to admission and pt would like to continue with their services. CM updated Dorian Pod with [facility] of the new orders.  Wife to provide transportation home later today.   Expected Discharge Date:  01/31/18               Expected Discharge Plan:  [place]  In-House Referral:     Discharge planning Services  CM Consult  Post Acute Care Choice:  Home Health Choice offered to:  Patient, Spouse  DME Arranged:    DME Agency:     HH Arranged:  RN, PT, OT, Nurse's Aide, Speech Therapy [place] Agency:  [facility]  Status of Service:  Completed, signed off  If discussed at [place] of Stay Meetings, dates discussed:    Additional Comments:  Pollie Friar, RN 01/31/2018, 12:01 PM

## 2018-01-31 NOTE — Plan of Care (Signed)
  Problem: Education: Goal: Knowledge of General Education information will improve Description Including pain rating scale, medication(s)/side effects and non-pharmacologic comfort measures Outcome: Completed/Met   Problem: Health Behavior/Discharge Planning: Goal: Ability to manage health-related needs will improve Outcome: Completed/Met   Problem: Clinical Measurements: Goal: Ability to maintain clinical measurements within normal limits will improve Outcome: Completed/Met   Problem: Activity: Goal: Risk for activity intolerance will decrease Outcome: Completed/Met   Problem: Coping: Goal: Level of anxiety will decrease Outcome: Completed/Met   Problem: Elimination: Goal: Will not experience complications related to bowel motility Outcome: Completed/Met   Problem: Pain Managment: Goal: General experience of comfort will improve Outcome: Completed/Met   Problem: Safety: Goal: Ability to remain free from injury will improve Outcome: Completed/Met   Problem: Skin Integrity: Goal: Risk for impaired skin integrity will decrease Outcome: Completed/Met   Problem: Education: Goal: Knowledge of disease or condition will improve Outcome: Completed/Met Goal: Knowledge of secondary prevention will improve Outcome: Completed/Met Goal: Knowledge of patient specific risk factors addressed and post discharge goals established will improve Outcome: Completed/Met   Problem: Coping: Goal: Will verbalize positive feelings about self Outcome: Completed/Met   Problem: Self-Care: Goal: Ability to participate in self-care as condition permits will improve Outcome: Completed/Met   Problem: Nutrition: Goal: Dietary intake will improve Outcome: Completed/Met   Problem: Ischemic Stroke/TIA Tissue Perfusion: Goal: Complications of ischemic stroke/TIA will be minimized Outcome: Completed/Met

## 2018-01-31 NOTE — Progress Notes (Addendum)
STROKE TEAM PROGRESS NOTE   INTERVAL HISTORY Wife not present today. He has been for CT this am. States he is feeling fine.   Vitals:   01/30/18 1207 01/30/18 1556 01/30/18 1928 01/30/18 2322  BP: (!) 147/76 (!) 141/84 (!) 156/73 117/60  Pulse: 62 (!) 54 62 63  Resp: 19 20 19 18   Temp: (!) 97.5 F (36.4 C) (!) 97.5 F (36.4 C) 97.6 F (36.4 C) 98.1 F (36.7 C)  TempSrc: Oral Oral Oral Oral  SpO2: 100% 99% 98% 97%  Weight:      Height:        CBC:  Recent Labs  Lab 01/29/18 1009 01/29/18 1126  WBC 9.6  --   NEUTROABS 7.3  --   HGB 13.5 13.9  HCT 40.7 41.0  MCV 99.5  --   PLT 155  --     Basic Metabolic Panel:  Recent Labs  Lab 01/29/18 1009 01/29/18 1126  NA 138 140  K 4.4 4.4  CL 110 106  CO2 22  --   GLUCOSE 214* 213*  BUN 22 24*  CREATININE 1.15 1.10  CALCIUM 9.0  --    Lipid Panel:     Component Value Date/Time   CHOL 178 01/05/2018 0217   TRIG 142 01/05/2018 0217   HDL 38 (L) 01/05/2018 0217   CHOLHDL 4.7 01/05/2018 0217   VLDL 28 01/05/2018 0217   LDLCALC 112 (H) 01/05/2018 0217   HgbA1c:  Lab Results  Component Value Date   HGBA1C 7.2 (H) 01/05/2018    IMAGING Ct Chest W Contrast  Result Date: 01/31/2018 CLINICAL DATA:  Prostate carcinoma. Evaluate for metastatic disease. EXAM: CT CHEST, ABDOMEN, AND PELVIS WITH CONTRAST TECHNIQUE: Multidetector CT imaging of the chest, abdomen and pelvis was performed following the standard protocol during bolus administration of intravenous contrast. CONTRAST:  143mL OMNIPAQUE IOHEXOL 300 MG/ML  SOLN COMPARISON:  None. FINDINGS: CT CHEST FINDINGS Cardiovascular: Coronary artery calcification and aortic atherosclerotic calcification. Mediastinum/Nodes: No axillary supraclavicular adenopathy. No mediastinal adenopathy. No pericardial effusion. Lungs/Pleura: Volume loss LEFT hemithorax. Chronic LEFT effusion atelectasis not changed. Thoracotomy defect in the ribs posteriorly. No suspicious nodularity. RIGHT lung  clear. Musculoskeletal: No aggressive osseous lesion. CT ABDOMEN AND PELVIS FINDINGS Hepatobiliary: Tiny hypodense lesion in the LEFT hepatic lobe (image 47/3) measuring 5 mm is favored benign. Pancreas: Pancreas is normal. No ductal dilatation. No pancreatic inflammation. Spleen: Normal spleen Adrenals/urinary tract: Adrenal glands and kidneys are normal. The ureters and bladder normal. Stomach/Bowel: Stomach, small bowel, appendix, and cecum are normal. The colon and rectosigmoid colon are normal. Vascular/Lymphatic: Abdominal aorta is normal caliber with atherosclerotic calcification. There is no retroperitoneal or periportal lymphadenopathy. No pelvic lymphadenopathy. Reproductive: Post prostatectomy.  No pelvic lymphadenopathy. Other: No free fluid. Musculoskeletal: No aggressive osseous lesion. IMPRESSION: 1. No evidence of prostate cancer metastasis on chest abdomen pelvis scan. 2. Post prostatectomy. 3. Postsurgical change in the LEFT hemithorax with chronic LEFT effusion atelectasis. Electronically Signed   By: Suzy Bouchard M.D.   On: 01/31/2018 10:20   Mr Brain Wo Contrast  Result Date: 01/29/2018 CLINICAL DATA:  84 y/o M; ataxia, stroke suspected. Speech difficulty. EXAM: MRI HEAD WITHOUT CONTRAST TECHNIQUE: Multiplanar, multiecho pulse sequences of the brain and surrounding structures were obtained without intravenous contrast. COMPARISON:  12/29/2017 CT head.  01/04/2018 MRI head. FINDINGS: Brain: 5 mm focus of reduced diffusion within the left superior frontal centrum semiovale compatible with acute/early subacute infarction. No associated hemorrhage or mass effect. Multiple very small  chronic infarctions are present within the right cerebellar hemisphere. There are small chronic cortical infarctions within the right occipital and parietal lobes as well as within the left watershed distribution and left lateral temporal lobe. There is hemosiderin staining of the cortical supratentorial  infarctions. Additionally, there are numerous punctate foci of susceptibility hypointensity throughout the brain compatible with chronic microhemorrhage. Vascular: Normal central flow voids. Skull and upper cervical spine: Normal marrow signal. Sinuses/Orbits: Negative. Other: None. IMPRESSION: 1. 5 mm acute/early subacute infarction within the left superior frontal centrum semiovale. No associated hemorrhage or mass effect. 2. Multiple stable chronic infarctions as above. These results were called by telephone at the time of interpretation on 01/29/2018 at 7:15 pm to Dr. Lita Mains, who verbally acknowledged these results. Electronically Signed   By: Kristine Garbe M.D.   On: 01/29/2018 19:17   Ct Abdomen Pelvis W Contrast  Result Date: 01/31/2018 CLINICAL DATA:  Prostate carcinoma. Evaluate for metastatic disease. EXAM: CT CHEST, ABDOMEN, AND PELVIS WITH CONTRAST TECHNIQUE: Multidetector CT imaging of the chest, abdomen and pelvis was performed following the standard protocol during bolus administration of intravenous contrast. CONTRAST:  164mL OMNIPAQUE IOHEXOL 300 MG/ML  SOLN COMPARISON:  None. FINDINGS: CT CHEST FINDINGS Cardiovascular: Coronary artery calcification and aortic atherosclerotic calcification. Mediastinum/Nodes: No axillary supraclavicular adenopathy. No mediastinal adenopathy. No pericardial effusion. Lungs/Pleura: Volume loss LEFT hemithorax. Chronic LEFT effusion atelectasis not changed. Thoracotomy defect in the ribs posteriorly. No suspicious nodularity. RIGHT lung clear. Musculoskeletal: No aggressive osseous lesion. CT ABDOMEN AND PELVIS FINDINGS Hepatobiliary: Tiny hypodense lesion in the LEFT hepatic lobe (image 47/3) measuring 5 mm is favored benign. Pancreas: Pancreas is normal. No ductal dilatation. No pancreatic inflammation. Spleen: Normal spleen Adrenals/urinary tract: Adrenal glands and kidneys are normal. The ureters and bladder normal. Stomach/Bowel: Stomach, small  bowel, appendix, and cecum are normal. The colon and rectosigmoid colon are normal. Vascular/Lymphatic: Abdominal aorta is normal caliber with atherosclerotic calcification. There is no retroperitoneal or periportal lymphadenopathy. No pelvic lymphadenopathy. Reproductive: Post prostatectomy.  No pelvic lymphadenopathy. Other: No free fluid. Musculoskeletal: No aggressive osseous lesion. IMPRESSION: 1. No evidence of prostate cancer metastasis on chest abdomen pelvis scan. 2. Post prostatectomy. 3. Postsurgical change in the LEFT hemithorax with chronic LEFT effusion atelectasis. Electronically Signed   By: Suzy Bouchard M.D.   On: 01/31/2018 10:20    PHYSICAL EXAM HEENT: [place]/AT Lungs: Respirations unlabored Ext: No edema  Neurologic Examination: Mental Status: Alert, poor orientation (knows he is in the hospital but not which one). Poor memory. Poor recall of events from yesterday. Speech fluent with intact comprehension for commands and questions. Tangential answers to multiple questions. Repetition and naming intact. Able to follow basic motor commands, including a 3 step directional command.  Cranial Nerves: II:  Visual fields intact with no extinction to DSS. PERRL  III,IV, VI: No ptosis. EOMI VIII: hearing intact to voice IX,X: No hypophonia or hoarseness XI: mild R facial weakness XII: midline tongue extension  Motor: Right :  Upper extremity   5/5                                      Left:     Upper extremity   5/5             Lower extremity   5/5  Lower extremity   5/5 Normal tone throughout; no atrophy noted Sensory: Temp and light touch intact throughout, bilaterally. No extinction.  Deep Tendon Reflexes:  2+ bilateral upper and lower extremities without asymmetry.  Cerebellar: No ataxia with FNF or H-S bilaterally Gait: Deferred  ASSESSMENT/PLAN Mr. Dell Briner is a 84 y.o. male with history of hypertension, diabetes,  prostate cancer, stroke in 2018 with residual right-sided paresthesias and aphasia, with another stroke in October 2019 still getting home therapy, short-term memory problems and medical noncompliance presenting with dizziness, nausea and vomiting.   BPPV  Patient symptoms with getting up and rolling in bed in certain position causing vertigo  Vertigo or short lasting  Consistent with posterior and horizontal semi-canal BPPV  Dix-Hallpike maneuver showed mild vertigo on right turning, consistent with right horizontal semi-canal BPPV  Recommend PT follow-up for vestibular rehab and Epley maneuver  Incidental stroke:   Punctate left centrum semiovale/corona radiata white matter infarct secondary to small vessel disease.  Current stroke not etiology of presenting symptoms. History of prior strokes, likely embolic, source unknown.  CT head No acute stroke. Moderate small vessel disease. Atrophy. Old posterior R infarcts.   MRI small acute/subacute left superior frontal centrum semiovale infarct.  Stable old infarcts.  No need to repeat stroke work-up just completed in October of this year   Given history of prostate cancer and concern for possible embolic source of strokes, di pan CT of chest abdomen and pelvis which was negative for metastatic source  Lovenox 40 mg sq daily for VTE prophylaxis  aspirin 81 mg daily and clopidogrel 75 mg daily prior to admission (had been ordered Plavix alone prior to stroke in October but was not taking), now on aspirin 81 mg daily and clopidogrel 75 mg daily x 3 weeks then aspirin alone  Therapy recommendations:  HH PT and OT. Add VB PT to home PT. Discussed with CM who will add.  Disposition:  return home w/ wife  30d monitor recommended at d/c NOTHING FURTHER TO ADD FROM THE STROKE STANDPOINT. Will sign off. Follow-up Stroke Clinic at [facility] in 4 weeks. Office will call with appointment date and time. Order  placed.  Hypertension  Hx Orthostatic hypotension during last adsmission  Stable . BP goal normotensive  Hyperlipidemia  Home meds:  lipitor 40, resumed in hospital -Lipitor recently started during October admission  LDL 112, goal < 70  Continue statin at discharge  Diabetes type II with renal manifestations  HgbA1c 7.2, goal < 7.0  Noncompliant with diet and medications prior to stroke in October  Uncontrolled  Hx of stroke  11/2016 - right arm weakness with dysarthria - consider stroke at OSH - resolved in 3 hr  07/2017 - expressive aphasia - lasting 50 min, CT showed old right MCA and the left parietal as well as right MCA PCA old infarcts.  Was recommended 30-day cardio event monitoring with his cardiologist Dr. Ellyn Hack, but that was not done.  12/2017 R frontal punctate infarct. MRA head and neck negative.  DVT negative.  EF 60 to 65%.  LDL 112 and A1c 7.2.  He was put on DAPT for 3 weeks as well as Lipitor 40.  He was found also had orthostatic hypotension and was treated with fluid and TED hose.  CT head showed old right MCA, MCA/PCA, and left parietal infarcts  Given the recurrent stroke like episodes and old infarcts on CT/MRI, recommend 30 day cardiac event monitoring to rule out afib as outpt.  Followed by Dr. Ellyn Hack  Other Stroke Risk Factors  Advanced age  Family hx stroke (father)  Other Active Problems  Baseline memory deficit   Hx prostate cancer. PSA 0.02.  Currently undergoing hormone injections per wife  Medical noncompliance  Low B12 put on oral replacement in October  Chronic kidney disease stage III  Hospital day # 1  Burnetta Sabin, MSN, APRN, ANVP-BC, AGPCNP-BC Advanced Practice Stroke Nurse [place] for Schedule & Pager information 01/31/2018 8:25 AM   ATTENDING NOTE: I reviewed above note and agree with the assessment and plan. Pt was seen and examined.   Patient is sitting in chair for dinner. Wife not at  bedside. Denies any vertigo today. Worked with PT and did not see any sign of BPPV. Pan CT showed no evidence of prostate cancer metastasis or any other malignancy.   Patient does have a punctate left semiovale restriction diffusion on DWI.  In 12/2017 he was admitted for right frontal punctate infarct MRA head and neck negative.  DVT negative.  EF 60 to 65%.  LDL 112 and A1c 7.2.  He was put on DAPT for 3 weeks as well as Lipitor 40.  He was found also had orthostatic hypotension and was treated with fluid and TED hose.  He has in the past in 11/2016 of right side arm weakness and slurred speech resolved in 3 hours.  In 07/2017 he had aphasia for 50 minutes and CT showed old right MCA and the left parietal as well as right MCA PCA old infarcts.  Was recommended 30-day cardio event monitoring with his cardiologist Dr. Ellyn Hack, but that was not done.  Taken together, stroke this time more likely small vessel disease, however cardioembolic source cannot be ruled out due to multiple vessel territory, we also recommend outpatient 30-day Cardio event monitoring to rule out A. Fib. Continue aspirin Plavix for 3 weeks and then aspirin alone.  Continue Lipitor for stroke prevention.    Neurology will sign off. Please call with questions. Pt will follow up with stroke clinic NP at [facility] on 02/08/18. Thanks for the consult.   Rosalin Hawking, MD PhD Stroke Neurology 01/31/2018 9:37 PM    To contact Stroke Continuity provider, please refer to [email or web address] After hours, contact General Neurology

## 2018-01-31 NOTE — Progress Notes (Signed)
Physical Therapy Treatment Patient Details Name: Deionte Spivack MRN: [id number] DOB: [birth year] Today's Date: 01/31/2018    History of Present Illness  [name] a 84 y.o. male with medical history significant of diabetes mellitus, hyperlipidemia, depression, prostate cancer (prostatectomy), CKD 3, recent stroke, who presents with dizziness. MRI brain in the ED revealed a subcentimeter focus of restricted diffusion in the left centrum semiovale, per neurology unlikely to be due to embolic phenomenon.      PT Comments    Pt doing well overall, likely closing in on his baseline functioning.  He could use some balance and deconditioning work at home with [place].  From a vestibular assessment stand point, pt looks okay.  Smooth pursuits and saccades functional with pt having a little difficulty moving into the right lower quadrant smoothly.  VOR and head thrust were functional.  [facility] R and L were negative symptomatically nor showed any nystagmus.  Supine head roll was negative L and R.  Pt reported mild lightheadedness coming to sit at times.      Follow Up Recommendations  Home health PT;Supervision/Assistance - 24 hour     Equipment Recommendations  None recommended by PT    Recommendations for Other Services       Precautions / Restrictions Precautions Precautions: Fall Precaution Comments: hx falls     Mobility  Bed Mobility Overal bed mobility: Modified Independent                Transfers   Equipment used: Straight cane Transfers: Sit to/from Stand Sit to Stand: Supervision         General transfer comment: supervision for safety   Ambulation/Gait Ambulation/Gait assistance: Min guard;Supervision Gait Distance (Feet): 270 Feet Assistive device: Straight cane Gait Pattern/deviations: Step-through pattern   Gait velocity interpretation: <1.8 ft/sec, indicate of risk for recurrent falls General Gait Details: still mildly unsteady at times.  When he uses  the cane, he sequences well, but he is destractible and doesn't divide his attn well, so carries the cane much of the time.   Stairs             Wheelchair Mobility    Modified Rankin (Stroke Patients Only) Modified Rankin (Stroke Patients Only) Pre-Morbid Rankin Score: No significant disability Modified Rankin: Moderate disability     Balance Overall balance assessment: Needs assistance Sitting-balance support: No upper extremity supported Sitting balance-Leahy Scale: Good     Standing balance support: No upper extremity supported;Single extremity supported Standing balance-Leahy Scale: Fair Standing balance comment: able to maintain static standing with supervision, limited dynamic                             Cognition Arousal/Alertness: Awake/alert Behavior During Therapy: WFL for tasks assessed/performed;Impulsive Overall Cognitive Status: History of cognitive impairments - at baseline                                        Exercises      General Comments        Pertinent Vitals/Pain      Home Living                      Prior Function            PT Goals (current goals can now be found in the care plan section)  Acute Rehab PT Goals Patient Stated Goal: to go home PT Goal Formulation: With patient Time For Goal Achievement: 02/06/18 Potential to Achieve Goals: Good Progress towards PT goals: Progressing toward goals    Frequency    Min 3X/week      PT Plan Current plan remains appropriate    Co-evaluation              AM-PAC PT "6 Clicks" Daily Activity  Outcome Measure  Difficulty turning over in bed (including adjusting bedclothes, sheets and blankets)?: None Difficulty moving from lying on back to sitting on the side of the bed? : None Difficulty sitting down on and standing up from a chair with arms (e.g., wheelchair, bedside commode, etc,.)?: None Help needed moving to and from a bed to  chair (including a wheelchair)?: A Little Help needed walking in hospital room?: A Little Help needed climbing 3-5 steps with a railing? : A Little 6 Click Score: 21    End of Session   Activity Tolerance: Patient tolerated treatment well Patient left: in bed;with call bell/phone within reach;Other (comment);with bed alarm set(sitting in bed in chair position) Nurse Communication: Mobility status PT Visit Diagnosis: Unsteadiness on feet (R26.81);Other symptoms and signs involving the nervous system (R29.898)     Time: 8299-3716 PT Time Calculation (min) (ACUTE ONLY): 44 min  Charges:  $Gait Training: 8-22 mins $Therapeutic Activity: 8-22 mins $Neuromuscular Re-education: 8-22 mins                     01/31/2018  Donnella Sham, [place] [phone]  (pager) [phone]  (office)   Tessie Fass Emonni Depasquale 01/31/2018, 12:26 PM

## 2018-01-31 NOTE — Discharge Summary (Signed)
Physician Discharge Summary   Patient ID: Jeremiah Mccoy MRN: [id number] DOB/AGE: 1933/10/22 84 y.o.  Admit date: 01/29/2018 Discharge date: 01/31/2018  Primary Care Physician:  Libby Maw, MD   Recommendations for Outpatient Follow-up:  1. Follow up with PCP in 1-2 weeks 2. Outpatient follow-up with neurology  Home Health: Home health PT OT Equipment/Devices:   Discharge Condition: stable  CODE STATUS: FULL Diet recommendation: Carb modified diet   Discharge Diagnoses:    . Acute ischemic left MCA stroke ([place]) . Type II diabetes mellitus with renal manifestations ([place]) . HLD (hyperlipidemia) . Prostate cancer ([place]) . CKD (chronic kidney disease), stage III ([place]) . Dizziness   Consults: Neurology, stroke service    Allergies:   Allergies  Allergen Reactions  . Morphine And Related Other (See Comments)  . Motrin [Ibuprofen] Other (See Comments)    Dry heaves  . Sulfa Antibiotics Other (See Comments)    Dry heaves     DISCHARGE MEDICATIONS: Allergies as of 01/31/2018      Reactions   Morphine And Related Other (See Comments)   Motrin [ibuprofen] Other (See Comments)   Dry heaves   Sulfa Antibiotics Other (See Comments)   Dry heaves      Medication List    TAKE these medications   aspirin 81 MG EC tablet Take 1 tablet (81 mg total) by mouth daily.   atorvastatin 40 MG tablet Commonly known as:  LIPITOR Take 1 tablet (40 mg total) by mouth daily at 6 PM.   clopidogrel 75 MG tablet Commonly known as:  PLAVIX Take 1 tablet (75 mg total) by mouth daily.   cyanocobalamin 1000 MCG tablet Take 1 tablet (1,000 mcg total) by mouth daily.   meclizine 25 MG tablet Commonly known as:  ANTIVERT Take 1 tablet (25 mg total) by mouth 3 (three) times daily as needed for dizziness (also available OTC).   metFORMIN 500 MG tablet Commonly known as:  GLUCOPHAGE TAKE 1 TABLET BY MOUTH TWICE DAILY WITH MEAL What changed:    how much to take  how  to take this  when to take this        Brief H and P: For complete details please refer to admission H and P, but in brief : Jeremiah Mccoy is a 84 y.o. male with medical history significant of diabetes mellitus, hyperlipidemia, depression, prostate cancer (prostatectomy), CKD 3, recent stroke, who presents with dizziness.  The patient was recently hospitalized from October 10 to October 11 due to an acute stroke.  He was started on aspirin and Plavix.  He is been compliant with his medications and was doing well until 9 AM on 4 November he developed some dizziness.  The room was spinning around him but he did not have any unilateral weakness, numbness or tingling.  He had no vision change or hearing loss and no ringing in his ears.  MRI of the brain showed 5 mm early infarction in the left superior frontal centrum semiovale consistent with left MCA stroke.  Patient was admitted for further work-up.  Hospital Course:     Acute ischemic left MCA stroke [facility]) -Patient recently had punctate acute infarct in the anterior right frontal lobe. -MRI of the brain showed 5 mm or infarction the left superior frontal centrum semiovale consistent with left MCA stroke  -neurology was consulted, recommended no need to repeat stroke work-up just completed last month -Neurology recommended aspirin 81 mg daily and Plavix for 3 weeks then aspirin alone.  Patient was on aspirin 81 mg daily and Plavix prior to admission however reported that he was not taking it.  I discussed with Dr. Erlinda Hong at the time of discharge and patient was cleared with this regimen. -PT OT recommended home health PT -2D echo showed EF of 60 to 65%, bilateral lower extremity venous Dopplers were negative for DVT Recent hemoglobin A1c 7.2, LDL 112, continue statin -Recommended 30-day event monitor, cardiology office was notified, patient will follow with his cardiologist Dr. Ellyn Hack    Dizziness -Currently resolved, patient had orthostatic  hypotension during the previous admission, received IV fluid, meclizine as needed PT OT recommended home health PT   Type 2 diabetes mellitus with CKD Recent A1c 7.2 on 10/11, continue metformin  Hyperlipidemia Continue Lipitor  History of prostate cancer status post prostatectomy CT chest abdomen and pelvis were done, showed no evidence of prostate cancer metastasis on chest abdomen pelvis PSA 0.02 on 11/5  CKD stage III Stable, creatinine at baseline   Day of Discharge S: *No acute issues, wants to go home  BP (!) 143/71 (BP Location: Right Arm)   Pulse 63   Temp 97.6 F (36.4 C) (Oral)   Resp 18   Ht 5\' 11"  (1.803 m)   Wt 91.5 kg   SpO2 98%   BMI 28.13 kg/m   Physical Exam: General: Alert and awake oriented x3 not in any acute distress. HEENT: anicteric sclera, pupils reactive to light and accommodation CVS: S1-S2 clear no murmur rubs or gallops Chest: clear to auscultation bilaterally, no wheezing rales or rhonchi Abdomen: soft nontender, nondistended, normal bowel sounds Extremities: no cyanosis, clubbing or edema noted bilaterally Neuro: strength 5/5 bilaterally upper and lower extremities   The results of significant diagnostics from this hospitalization (including imaging, microbiology, ancillary and laboratory) are listed below for reference.      Procedures/Studies:  Ct Head Wo Contrast  Result Date: 01/29/2018 CLINICAL DATA:  Dizziness beginning this morning with some nausea and vomiting. EXAM: CT HEAD WITHOUT CONTRAST TECHNIQUE: Contiguous axial images were obtained from the base of the skull through the vertex without intravenous contrast. COMPARISON:  01/04/2018 FINDINGS: Brain: The ventricles and cisterns are within normal. There is mild age related atrophy unchanged. There is moderate chronic ischemic microvascular disease. Small old right posterior parieto-occipital watershed infarct and small old right occipital infarct. No mass, mass effect,  shift of midline structures or acute hemorrhage. No evidence of acute infarction. Vascular: No hyperdense vessel or unexpected calcification. Skull: Normal. Negative for fracture or focal lesion. Sinuses/Orbits: No acute finding. Other: None. IMPRESSION: No acute findings. Moderate chronic ischemic microvascular disease and age related atrophy. Old posterior right infarcts as described. Electronically Signed   By: Marin Olp M.D.   On: 01/29/2018 11:16   Ct Head Wo Contrast  Result Date: 01/04/2018 CLINICAL DATA:  Acute onset dizziness today. EXAM: CT HEAD WITHOUT CONTRAST TECHNIQUE: Contiguous axial images were obtained from the base of the skull through the vertex without intravenous contrast. COMPARISON:  None. FINDINGS: Brain: No evidence of acute infarction, hemorrhage, hydrocephalus, extra-axial collection or mass lesion/mass effect. The brain is atrophic with chronic microvascular ischemic change and remote right parietal and PCA territory infarcts. Small, remote left parietal subcortical infarct is also seen. There is also a small lacunar infarction in right cerebellar hemisphere. Vascular: No hyperdense vessel or unexpected calcification. Skull: Normal. Negative for fracture or focal lesion. Sinuses/Orbits: Negative. Other: None. IMPRESSION: No acute abnormality. Atrophy, chronic microvascular ischemic change and remote infarcts as  described above. Electronically Signed   By: Inge Rise M.D.   On: 01/04/2018 13:13   Ct Chest W Contrast  Result Date: 01/31/2018 CLINICAL DATA:  Prostate carcinoma. Evaluate for metastatic disease. EXAM: CT CHEST, ABDOMEN, AND PELVIS WITH CONTRAST TECHNIQUE: Multidetector CT imaging of the chest, abdomen and pelvis was performed following the standard protocol during bolus administration of intravenous contrast. CONTRAST:  135mL OMNIPAQUE IOHEXOL 300 MG/ML  SOLN COMPARISON:  None. FINDINGS: CT CHEST FINDINGS Cardiovascular: Coronary artery calcification and  aortic atherosclerotic calcification. Mediastinum/Nodes: No axillary supraclavicular adenopathy. No mediastinal adenopathy. No pericardial effusion. Lungs/Pleura: Volume loss LEFT hemithorax. Chronic LEFT effusion atelectasis not changed. Thoracotomy defect in the ribs posteriorly. No suspicious nodularity. RIGHT lung clear. Musculoskeletal: No aggressive osseous lesion. CT ABDOMEN AND PELVIS FINDINGS Hepatobiliary: Tiny hypodense lesion in the LEFT hepatic lobe (image 47/3) measuring 5 mm is favored benign. Pancreas: Pancreas is normal. No ductal dilatation. No pancreatic inflammation. Spleen: Normal spleen Adrenals/urinary tract: Adrenal glands and kidneys are normal. The ureters and bladder normal. Stomach/Bowel: Stomach, small bowel, appendix, and cecum are normal. The colon and rectosigmoid colon are normal. Vascular/Lymphatic: Abdominal aorta is normal caliber with atherosclerotic calcification. There is no retroperitoneal or periportal lymphadenopathy. No pelvic lymphadenopathy. Reproductive: Post prostatectomy.  No pelvic lymphadenopathy. Other: No free fluid. Musculoskeletal: No aggressive osseous lesion. IMPRESSION: 1. No evidence of prostate cancer metastasis on chest abdomen pelvis scan. 2. Post prostatectomy. 3. Postsurgical change in the LEFT hemithorax with chronic LEFT effusion atelectasis. Electronically Signed   By: Suzy Bouchard M.D.   On: 01/31/2018 10:20   [name] Head Wo Contrast  Result Date: 01/04/2018 CLINICAL DATA:  Initial evaluation for acute stroke. EXAM: MRA HEAD WITHOUT CONTRAST MRA NECK WITHOUT CONTRAST TECHNIQUE: Angiographic images of the Circle of Willis were obtained using MRA technique without intravenous contrast. Angiographic images of the neck were obtained using MRA technique without intravenous contrast. Carotid stenosis measurements (when applicable) are obtained utilizing NASCET criteria, using the distal internal carotid diameter as the denominator. COMPARISON:   Limited brain MRI performed earlier the same day. FINDINGS: MRA HEAD FINDINGS Distal cervical segments of the internal carotid arteries are patent with antegrade flow. Petrous, cavernous, and supraclinoid segments patent without hemodynamically significant stenosis. Origin of the ophthalmic arteries patent bilaterally. ICA termini widely patent. A1 segments, anterior communicating artery common anterior cerebral arteries widely patent and normal. No M1 stenosis or occlusion. Normal MCA bifurcations. Distal MCA branches well perfused and symmetric. Mild distal small vessel atheromatous irregularity. Vertebral arteries widely patent to the vertebrobasilar junction without stenosis. Posterior inferior cerebral arteries patent bilaterally. Basilar artery widely patent to its distal aspect. Superior cerebellar and posterior cerebral arteries widely patent bilaterally. Mild distal small vessel atheromatous irregularity. No aneurysm. MRA NECK FINDINGS Examination technically limited by lack of IV contrast. Aortic arch and origin of the great vessels not well assessed on this examination. Visualized right common carotid artery widely patent to the bifurcation without stenosis. Mild atheromatous narrowing about the proximal right ICA without significant stenosis. Right ICA otherwise widely patent to the skull base without flow-limiting stenosis or occlusion. Visualized left common carotid artery widely patent to the bifurcation. Mild smooth atheromatous narrowing at the origin of the left ICA. Left ICA otherwise widely patent to the skull base without stenosis or occlusion. Origin of the vertebral arteries not well assessed on this exam. Vertebral arteries otherwise widely patent to the vertebrobasilar junction without stenosis or occlusion. IMPRESSION: MRA HEAD IMPRESSION: 1. Negative intracranial MRA. No large  vessel occlusion. No hemodynamically significant or correctable stenosis. 2. Mild distal small vessel atheromatous  irregularity. MRA NECK IMPRESSION: Negative MRA of the neck. No vascular occlusion or hemodynamically significant stenosis identified. Electronically Signed   By: Jeannine Boga M.D.   On: 01/04/2018 22:21   [name] Neck Wo Contrast  Result Date: 01/04/2018 CLINICAL DATA:  Initial evaluation for acute stroke. EXAM: MRA HEAD WITHOUT CONTRAST MRA NECK WITHOUT CONTRAST TECHNIQUE: Angiographic images of the Circle of Willis were obtained using MRA technique without intravenous contrast. Angiographic images of the neck were obtained using MRA technique without intravenous contrast. Carotid stenosis measurements (when applicable) are obtained utilizing NASCET criteria, using the distal internal carotid diameter as the denominator. COMPARISON:  Limited brain MRI performed earlier the same day. FINDINGS: MRA HEAD FINDINGS Distal cervical segments of the internal carotid arteries are patent with antegrade flow. Petrous, cavernous, and supraclinoid segments patent without hemodynamically significant stenosis. Origin of the ophthalmic arteries patent bilaterally. ICA termini widely patent. A1 segments, anterior communicating artery common anterior cerebral arteries widely patent and normal. No M1 stenosis or occlusion. Normal MCA bifurcations. Distal MCA branches well perfused and symmetric. Mild distal small vessel atheromatous irregularity. Vertebral arteries widely patent to the vertebrobasilar junction without stenosis. Posterior inferior cerebral arteries patent bilaterally. Basilar artery widely patent to its distal aspect. Superior cerebellar and posterior cerebral arteries widely patent bilaterally. Mild distal small vessel atheromatous irregularity. No aneurysm. MRA NECK FINDINGS Examination technically limited by lack of IV contrast. Aortic arch and origin of the great vessels not well assessed on this examination. Visualized right common carotid artery widely patent to the bifurcation without stenosis.  Mild atheromatous narrowing about the proximal right ICA without significant stenosis. Right ICA otherwise widely patent to the skull base without flow-limiting stenosis or occlusion. Visualized left common carotid artery widely patent to the bifurcation. Mild smooth atheromatous narrowing at the origin of the left ICA. Left ICA otherwise widely patent to the skull base without stenosis or occlusion. Origin of the vertebral arteries not well assessed on this exam. Vertebral arteries otherwise widely patent to the vertebrobasilar junction without stenosis or occlusion. IMPRESSION: MRA HEAD IMPRESSION: 1. Negative intracranial MRA. No large vessel occlusion. No hemodynamically significant or correctable stenosis. 2. Mild distal small vessel atheromatous irregularity. MRA NECK IMPRESSION: Negative MRA of the neck. No vascular occlusion or hemodynamically significant stenosis identified. Electronically Signed   By: Jeannine Boga M.D.   On: 01/04/2018 22:21   Mr Brain Wo Contrast  Result Date: 01/29/2018 CLINICAL DATA:  84 y/o M; ataxia, stroke suspected. Speech difficulty. EXAM: MRI HEAD WITHOUT CONTRAST TECHNIQUE: Multiplanar, multiecho pulse sequences of the brain and surrounding structures were obtained without intravenous contrast. COMPARISON:  12/29/2017 CT head.  01/04/2018 MRI head. FINDINGS: Brain: 5 mm focus of reduced diffusion within the left superior frontal centrum semiovale compatible with acute/early subacute infarction. No associated hemorrhage or mass effect. Multiple very small chronic infarctions are present within the right cerebellar hemisphere. There are small chronic cortical infarctions within the right occipital and parietal lobes as well as within the left watershed distribution and left lateral temporal lobe. There is hemosiderin staining of the cortical supratentorial infarctions. Additionally, there are numerous punctate foci of susceptibility hypointensity throughout the brain  compatible with chronic microhemorrhage. Vascular: Normal central flow voids. Skull and upper cervical spine: Normal marrow signal. Sinuses/Orbits: Negative. Other: None. IMPRESSION: 1. 5 mm acute/early subacute infarction within the left superior frontal centrum semiovale. No associated hemorrhage or mass effect. 2.  Multiple stable chronic infarctions as above. These results were called by telephone at the time of interpretation on 01/29/2018 at 7:15 pm to Dr. Lita Mains, who verbally acknowledged these results. Electronically Signed   By: Kristine Garbe M.D.   On: 01/29/2018 19:17   Mr Brain Wo Contrast  Result Date: 01/04/2018 CLINICAL DATA:  84 year old male with altered mental status, dizziness, near-syncope. EXAM: MRI HEAD WITHOUT CONTRAST TECHNIQUE: Multiplanar, multiecho pulse sequences of the brain and surrounding structures were obtained without intravenous contrast. COMPARISON:  Head CT 1252 hours today. FINDINGS: The examination was discontinued prior to completion by patient request. Only axial diffusion weighted imaging was obtained. There is a small 2-3 millimeter focus of restricted diffusion in the right anterior frontal lobe subcortical white matter on series 3, image 34. No other restricted diffusion or acute infarction. Chronic encephalomalacia in the left superior frontal gyrus, bilateral parietal lobes, right occipital pole, and posterior inferior left temporal lobe. These areas demonstrate facilitated diffusion. Evidence of small chronic right cerebellar infarcts was more apparent by CT. Stable ventricle size and configuration. No intracranial mass effect. IMPRESSION: 1. Positive for a small acute white matter infarct in the anterior right frontal lobe. 2. The examination was discontinued prior to completion by patient request and only axial diffusion weighted imaging was obtained. 3. Scattered chronic cortical infarcts in both hemispheres with encephalomalacia. Electronically  Signed   By: Genevie Ann M.D.   On: 01/04/2018 16:11   Ct Abdomen Pelvis W Contrast  Result Date: 01/31/2018 CLINICAL DATA:  Prostate carcinoma. Evaluate for metastatic disease. EXAM: CT CHEST, ABDOMEN, AND PELVIS WITH CONTRAST TECHNIQUE: Multidetector CT imaging of the chest, abdomen and pelvis was performed following the standard protocol during bolus administration of intravenous contrast. CONTRAST:  188mL OMNIPAQUE IOHEXOL 300 MG/ML  SOLN COMPARISON:  None. FINDINGS: CT CHEST FINDINGS Cardiovascular: Coronary artery calcification and aortic atherosclerotic calcification. Mediastinum/Nodes: No axillary supraclavicular adenopathy. No mediastinal adenopathy. No pericardial effusion. Lungs/Pleura: Volume loss LEFT hemithorax. Chronic LEFT effusion atelectasis not changed. Thoracotomy defect in the ribs posteriorly. No suspicious nodularity. RIGHT lung clear. Musculoskeletal: No aggressive osseous lesion. CT ABDOMEN AND PELVIS FINDINGS Hepatobiliary: Tiny hypodense lesion in the LEFT hepatic lobe (image 47/3) measuring 5 mm is favored benign. Pancreas: Pancreas is normal. No ductal dilatation. No pancreatic inflammation. Spleen: Normal spleen Adrenals/urinary tract: Adrenal glands and kidneys are normal. The ureters and bladder normal. Stomach/Bowel: Stomach, small bowel, appendix, and cecum are normal. The colon and rectosigmoid colon are normal. Vascular/Lymphatic: Abdominal aorta is normal caliber with atherosclerotic calcification. There is no retroperitoneal or periportal lymphadenopathy. No pelvic lymphadenopathy. Reproductive: Post prostatectomy.  No pelvic lymphadenopathy. Other: No free fluid. Musculoskeletal: No aggressive osseous lesion. IMPRESSION: 1. No evidence of prostate cancer metastasis on chest abdomen pelvis scan. 2. Post prostatectomy. 3. Postsurgical change in the LEFT hemithorax with chronic LEFT effusion atelectasis. Electronically Signed   By: Suzy Bouchard M.D.   On: 01/31/2018 10:20    Vas [place] Lower Extremity Venous (dvt)  Result Date: 01/05/2018  Lower Venous Study Indications: Stroke.  Risk Factors: Cancer history of Prostate cancer. Limitations: Body habitus and patient's discomfort. Performing Technologist: Lorina Rabon  Examination Guidelines: A complete evaluation includes B-mode imaging, spectral Doppler, color Doppler, and power Doppler as needed of all accessible portions of each vessel. Bilateral testing is considered an integral part of a complete examination. Limited examinations for reoccurring indications may be performed as noted.  Right Venous Findings: +---------+---------------+---------+-----------+----------+-------+          CompressibilityPhasicitySpontaneityPropertiesSummary +---------+---------------+---------+-----------+----------+-------+  CFV      Full           Yes      Yes                          +---------+---------------+---------+-----------+----------+-------+ SFJ      Full                                                 +---------+---------------+---------+-----------+----------+-------+ FV Prox  Full                                                 +---------+---------------+---------+-----------+----------+-------+ FV Mid   Full                                                 +---------+---------------+---------+-----------+----------+-------+ FV DistalFull                                                 +---------+---------------+---------+-----------+----------+-------+ PFV      Full                                                 +---------+---------------+---------+-----------+----------+-------+ POP      Full           Yes      Yes                          +---------+---------------+---------+-----------+----------+-------+ PTV      Full                                                 +---------+---------------+---------+-----------+----------+-------+ PERO     Full                                                  +---------+---------------+---------+-----------+----------+-------+  Left Venous Findings: +---------+---------------+---------+-----------+----------+-------------------+          CompressibilityPhasicitySpontaneityPropertiesSummary             +---------+---------------+---------+-----------+----------+-------------------+ CFV      Full           Yes      Yes                                      +---------+---------------+---------+-----------+----------+-------------------+ SFJ      Full                                                             +---------+---------------+---------+-----------+----------+-------------------+  FV Prox  Full                                                             +---------+---------------+---------+-----------+----------+-------------------+ FV Mid   Full                                                             +---------+---------------+---------+-----------+----------+-------------------+ FV DistalFull                                                             +---------+---------------+---------+-----------+----------+-------------------+ PFV      Full                                                             +---------+---------------+---------+-----------+----------+-------------------+ POP      Full           Yes      Yes                                      +---------+---------------+---------+-----------+----------+-------------------+ PTV                              Yes                  not well visualized +---------+---------------+---------+-----------+----------+-------------------+ PERO                             Yes                  not well visualized +---------+---------------+---------+-----------+----------+-------------------+    Summary: Right: There is no evidence of deep vein thrombosis in the lower extremity. However, portions of this  examination were limited- see technologist comments above. No cystic structure found in the popliteal fossa. Left: There is no evidence of deep vein thrombosis in the lower extremity. However, portions of this examination were limited- see technologist comments above. No cystic structure found in the popliteal fossa.  *See table(s) above for measurements and observations. Electronically signed by Servando Snare MD on 01/05/2018 at 11:57:43 AM.    Final       LAB RESULTS: Basic Metabolic Panel: Recent Labs  Lab 01/29/18 1009 01/29/18 1126  NA 138 140  K 4.4 4.4  CL 110 106  CO2 22  --   GLUCOSE 214* 213*  BUN 22 24*  CREATININE 1.15 1.10  CALCIUM 9.0  --    Liver Function Tests: Recent Labs  Lab 01/29/18 1009  AST 32  ALT 46*  ALKPHOS 56  BILITOT 0.4  PROT 6.6  ALBUMIN 3.5   No results for input(s): LIPASE, AMYLASE in the last 168 hours. No results for input(s): AMMONIA in the last 168 hours. CBC: Recent Labs  Lab 01/29/18 1009 01/29/18 1126  WBC 9.6  --   NEUTROABS 7.3  --   HGB 13.5 13.9  HCT 40.7 41.0  MCV 99.5  --   PLT 155  --    Cardiac Enzymes: No results for input(s): CKTOTAL, CKMB, CKMBINDEX, TROPONINI in the last 168 hours. BNP: Invalid input(s): POCBNP CBG: Recent Labs  Lab 01/30/18 2228 01/31/18 0642  GLUCAP 119* 114*      Disposition and Follow-up: Discharge Instructions    (HEART FAILURE PATIENTS) Call MD:  Anytime you have any of the following symptoms: 1) 3 pound weight gain in 24 hours or 5 pounds in 1 week 2) shortness of breath, with or without a dry hacking cough 3) swelling in the hands, feet or stomach 4) if you have to sleep on extra pillows at night in order to breathe.   Complete by:  As directed    Diet Carb Modified   Complete by:  As directed    Discharge instructions   Complete by:  As directed    Please take aspirin and plavix for 3 weeks. Then continue aspirin alone. Follow with your neurologist.   Increase activity  slowly   Complete by:  As directed        DISPOSITION: Home   DISCHARGE FOLLOW-UP Follow-up Information    Venancio Poisson, NP Follow up on 02/08/2018.   Specialty:  Nurse Practitioner Why:  at 3:45pm for stroke follow-up Contact information: [address] [place] [address] [phone]        Libby Maw, MD. Schedule an appointment as soon as possible for a visit in 2 week(s).   Specialty:  Family Medicine Contact information: [place] [address] [phone]            Time coordinating discharge:  35 minutes  Signed:   Estill Cotta M.D. [facility] 01/31/2018, 10:43 AM Pager: [phone]

## 2018-02-02 ENCOUNTER — Telehealth: Payer: Self-pay | Admitting: Family Medicine

## 2018-02-02 DIAGNOSIS — I69351 Hemiplegia and hemiparesis following cerebral infarction affecting right dominant side: Secondary | ICD-10-CM | POA: Diagnosis not present

## 2018-02-02 DIAGNOSIS — E119 Type 2 diabetes mellitus without complications: Secondary | ICD-10-CM | POA: Diagnosis not present

## 2018-02-02 DIAGNOSIS — I69320 Aphasia following cerebral infarction: Secondary | ICD-10-CM | POA: Diagnosis not present

## 2018-02-02 DIAGNOSIS — I10 Essential (primary) hypertension: Secondary | ICD-10-CM | POA: Diagnosis not present

## 2018-02-02 DIAGNOSIS — D649 Anemia, unspecified: Secondary | ICD-10-CM | POA: Diagnosis not present

## 2018-02-02 DIAGNOSIS — C61 Malignant neoplasm of prostate: Secondary | ICD-10-CM | POA: Diagnosis not present

## 2018-02-02 NOTE — Telephone Encounter (Signed)
Copied from [place] [id number]. Topic: Quick Communication - See Telephone Encounter >> Feb 02, 2018 10:24 AM Sheran Luz wrote: CRM for notification. See Telephone encounter for: 02/02/18.  Lattie Haw, from [facility], states that patient was discharged from hospital on Wednesday 11/6. Lattie Haw went to home to start resumption of care but patient and wife both told her that they did not feel he was safe at home. Since discharge patient has already fell and wife states she feels as if she cannot properly care for him. Lattie Haw would like to know if Dr. Ethelene Hal could assist with getting patient into rehabilitation facility. Requesting a call back to discuss further. Please advise.

## 2018-02-05 ENCOUNTER — Telehealth: Payer: Self-pay | Admitting: Behavioral Health

## 2018-02-05 DIAGNOSIS — C61 Malignant neoplasm of prostate: Secondary | ICD-10-CM | POA: Diagnosis not present

## 2018-02-05 DIAGNOSIS — I69320 Aphasia following cerebral infarction: Secondary | ICD-10-CM | POA: Diagnosis not present

## 2018-02-05 DIAGNOSIS — D649 Anemia, unspecified: Secondary | ICD-10-CM | POA: Diagnosis not present

## 2018-02-05 DIAGNOSIS — I10 Essential (primary) hypertension: Secondary | ICD-10-CM | POA: Diagnosis not present

## 2018-02-05 DIAGNOSIS — E119 Type 2 diabetes mellitus without complications: Secondary | ICD-10-CM | POA: Diagnosis not present

## 2018-02-05 DIAGNOSIS — I69351 Hemiplegia and hemiparesis following cerebral infarction affecting right dominant side: Secondary | ICD-10-CM | POA: Diagnosis not present

## 2018-02-05 NOTE — Telephone Encounter (Signed)
Transition Care Management Follow-up Telephone Call   Admit date: 01/29/2018 Discharge date: 01/31/2018  Primary Care Physician:  Libby Maw, MD   Recommendations for Outpatient Follow-up:  1. Follow up with PCP in 1-2 weeks 2. Outpatient follow-up with neurology  Home Health: Home health PT OT Equipment/Devices:   Discharge Condition: stable    How have you been since you were released from the hospital? Per the patient's spouse, "he's not doing too good, he fell last Wednesday, just hours after returning home from the hospital".   Do you understand why you were in the hospital? yes   Do you understand the discharge instructions? yes   Where were you discharged to? Home with spouse.   Items Reviewed:  Medications reviewed: yes  Allergies reviewed: yes  Dietary changes reviewed: yes, carb modified diet  Referrals reviewed: yes, Follow up with PCP in 1-2 weeks; Outpatient follow-up with neurology   Functional Questionnaire:   Activities of Daily Living (ADLs):   He states they are independent in the following: feeding, continence, grooming, toileting and dressing States they require assistance with the following: ambulation and bathing and hygiene   Any transportation issues/concerns?: no   Any patient concerns? yes, per the patient's wife, "she states that her husband is not safe at home & she's afraid of him falling again & not compliant with the regimen that OT/PT advises for him to follow each day."   Confirmed importance and date/time of follow-up visits scheduled yes, 02/07/18 at 11:30 AM.  Provider Appointment booked with Dr. Ethelene Hal.  Confirmed with patient if condition begins to worsen call PCP or go to the ER.  Patient was given the office number and encouraged to call back with question or concerns.  : yes

## 2018-02-06 DIAGNOSIS — C61 Malignant neoplasm of prostate: Secondary | ICD-10-CM | POA: Diagnosis not present

## 2018-02-07 ENCOUNTER — Ambulatory Visit (INDEPENDENT_AMBULATORY_CARE_PROVIDER_SITE_OTHER): Payer: Medicare Other | Admitting: Family Medicine

## 2018-02-07 ENCOUNTER — Encounter: Payer: Self-pay | Admitting: Family Medicine

## 2018-02-07 VITALS — BP 136/80 | HR 97 | Ht 71.0 in | Wt 204.5 lb

## 2018-02-07 DIAGNOSIS — I69351 Hemiplegia and hemiparesis following cerebral infarction affecting right dominant side: Secondary | ICD-10-CM | POA: Diagnosis not present

## 2018-02-07 DIAGNOSIS — R42 Dizziness and giddiness: Secondary | ICD-10-CM

## 2018-02-07 DIAGNOSIS — J40 Bronchitis, not specified as acute or chronic: Secondary | ICD-10-CM | POA: Diagnosis not present

## 2018-02-07 DIAGNOSIS — I69320 Aphasia following cerebral infarction: Secondary | ICD-10-CM | POA: Diagnosis not present

## 2018-02-07 DIAGNOSIS — Z09 Encounter for follow-up examination after completed treatment for conditions other than malignant neoplasm: Secondary | ICD-10-CM

## 2018-02-07 DIAGNOSIS — I10 Essential (primary) hypertension: Secondary | ICD-10-CM | POA: Diagnosis not present

## 2018-02-07 DIAGNOSIS — E119 Type 2 diabetes mellitus without complications: Secondary | ICD-10-CM | POA: Diagnosis not present

## 2018-02-07 DIAGNOSIS — C61 Malignant neoplasm of prostate: Secondary | ICD-10-CM | POA: Diagnosis not present

## 2018-02-07 DIAGNOSIS — D649 Anemia, unspecified: Secondary | ICD-10-CM | POA: Diagnosis not present

## 2018-02-07 MED ORDER — AZITHROMYCIN 250 MG PO TABS: ORAL_TABLET | ORAL | 0 refills | Status: DC

## 2018-02-07 MED ORDER — MECLIZINE HCL 25 MG PO TABS: 25.0000 mg | ORAL_TABLET | Freq: Three times a day (TID) | ORAL | 0 refills | Status: DC | PRN

## 2018-02-07 NOTE — Progress Notes (Signed)
Subjective:  Patient ID: Jeremiah Mccoy, male    DOB: 05-Apr-1933  Age: 84 y.o. MRN: [id number]  CC: Hospitalization Follow-up   HPI Jeremiah Mccoy presents for follow-up status post hospital observation for mild stroke.  Patient is back at home and taking his medicines as directed.  His wife is frustrated because he refuses to use his walker and has actually fallen once.  She had to call 911 and they came and helped him back up again.  He has had a cough for over a week now that is been nonproductive there is been no fever chills or reactive airway disease.  He has experienced some spinning sensation status post this past hospital visit.  Meclizine was noted on hospital discharge note but failed to reach to pharmacy.  Outpatient Medications Prior to Visit  Medication Sig Dispense Refill  . aspirin 81 MG EC tablet Take 1 tablet (81 mg total) by mouth daily. 90 tablet 3  . atorvastatin (LIPITOR) 40 MG tablet Take 1 tablet (40 mg total) by mouth daily at 6 PM. 30 tablet 2  . clopidogrel (PLAVIX) 75 MG tablet Take 1 tablet (75 mg total) by mouth daily. 30 tablet 0  . metFORMIN (GLUCOPHAGE) 500 MG tablet TAKE 1 TABLET BY MOUTH TWICE DAILY WITH MEAL (Patient taking differently: Take 500 mg by mouth 2 (two) times daily with a meal. TAKE 1 TABLET BY MOUTH TWICE DAILY WITH MEAL) 60 tablet 2  . vitamin B-12 1000 MCG tablet Take 1 tablet (1,000 mcg total) by mouth daily. 30 tablet 0  . meclizine (ANTIVERT) 25 MG tablet Take 1 tablet (25 mg total) by mouth 3 (three) times daily as needed for dizziness (also available OTC). 30 tablet 0   No facility-administered medications prior to visit.     ROS Review of Systems  Constitutional: Negative for chills, fatigue, fever and unexpected weight change.  HENT: Positive for congestion.   Eyes: Negative for photophobia and visual disturbance.  Respiratory: Positive for cough. Negative for shortness of breath and wheezing.   Cardiovascular: Negative.     Gastrointestinal: Negative.   Neurological: Positive for dizziness. Negative for headaches.  Hematological: Negative.   Psychiatric/Behavioral: Negative.     Objective:  BP 136/80 (BP Location: Left Arm, Patient Position: Sitting, Cuff Size: Normal)   Pulse 97   Ht 5\' 11"  (1.803 m)   Wt 204 lb 8 oz (92.8 kg)   SpO2 98%   BMI 28.52 kg/m   BP Readings from Last 3 Encounters:  02/07/18 136/80  01/31/18 132/70  01/11/18 126/70    Wt Readings from Last 3 Encounters:  02/07/18 204 lb 8 oz (92.8 kg)  01/30/18 201 lb 11.5 oz (91.5 kg)  01/11/18 197 lb (89.4 kg)    Physical Exam  Constitutional: He is oriented to person, place, and time. He appears well-developed and well-nourished. No distress.  HENT:  Head: Normocephalic and atraumatic.  Right Ear: External ear normal.  Left Ear: External ear normal.  Eyes: Right eye exhibits no discharge. Left eye exhibits no discharge. No scleral icterus.  Neck: No JVD present. No tracheal deviation present.  Cardiovascular: Normal rate, regular rhythm and normal heart sounds.  Pulmonary/Chest: Effort normal. No stridor. No respiratory distress. He has no wheezes.  Neurological: He is alert and oriented to person, place, and time.  Skin: Skin is warm and dry. He is not diaphoretic.  Psychiatric: He has a normal mood and affect. His behavior is normal.    Lab Results  Component Value Date   WBC 9.6 01/29/2018   HGB 13.9 01/29/2018   HCT 41.0 01/29/2018   PLT 155 01/29/2018   GLUCOSE 213 (H) 01/29/2018   CHOL 178 01/05/2018   TRIG 142 01/05/2018   HDL 38 (L) 01/05/2018   LDLCALC 112 (H) 01/05/2018   ALT 46 (H) 01/29/2018   AST 32 01/29/2018   NA 140 01/29/2018   K 4.4 01/29/2018   CL 106 01/29/2018   CREATININE 1.10 01/29/2018   BUN 24 (H) 01/29/2018   CO2 22 01/29/2018   TSH 5.474 (H) 01/05/2018   PSA 0.52 07/17/2017   INR 0.99 01/29/2018   HGBA1C 7.2 (H) 01/05/2018   MICROALBUR 7.7 (H) 03/16/2017    Ct Head Wo  Contrast  Result Date: 01/29/2018 CLINICAL DATA:  Dizziness beginning this morning with some nausea and vomiting. EXAM: CT HEAD WITHOUT CONTRAST TECHNIQUE: Contiguous axial images were obtained from the base of the skull through the vertex without intravenous contrast. COMPARISON:  01/04/2018 FINDINGS: Brain: The ventricles and cisterns are within normal. There is mild age related atrophy unchanged. There is moderate chronic ischemic microvascular disease. Small old right posterior parieto-occipital watershed infarct and small old right occipital infarct. No mass, mass effect, shift of midline structures or acute hemorrhage. No evidence of acute infarction. Vascular: No hyperdense vessel or unexpected calcification. Skull: Normal. Negative for fracture or focal lesion. Sinuses/Orbits: No acute finding. Other: None. IMPRESSION: No acute findings. Moderate chronic ischemic microvascular disease and age related atrophy. Old posterior right infarcts as described. Electronically Signed   By: Marin Olp M.D.   On: 01/29/2018 11:16   Mr Brain Wo Contrast  Result Date: 01/29/2018 CLINICAL DATA:  84 y/o M; ataxia, stroke suspected. Speech difficulty. EXAM: MRI HEAD WITHOUT CONTRAST TECHNIQUE: Multiplanar, multiecho pulse sequences of the brain and surrounding structures were obtained without intravenous contrast. COMPARISON:  12/29/2017 CT head.  01/04/2018 MRI head. FINDINGS: Brain: 5 mm focus of reduced diffusion within the left superior frontal centrum semiovale compatible with acute/early subacute infarction. No associated hemorrhage or mass effect. Multiple very small chronic infarctions are present within the right cerebellar hemisphere. There are small chronic cortical infarctions within the right occipital and parietal lobes as well as within the left watershed distribution and left lateral temporal lobe. There is hemosiderin staining of the cortical supratentorial infarctions. Additionally, there are  numerous punctate foci of susceptibility hypointensity throughout the brain compatible with chronic microhemorrhage. Vascular: Normal central flow voids. Skull and upper cervical spine: Normal marrow signal. Sinuses/Orbits: Negative. Other: None. IMPRESSION: 1. 5 mm acute/early subacute infarction within the left superior frontal centrum semiovale. No associated hemorrhage or mass effect. 2. Multiple stable chronic infarctions as above. These results were called by telephone at the time of interpretation on 01/29/2018 at 7:15 pm to Dr. Lita Mains, who verbally acknowledged these results. Electronically Signed   By: Kristine Garbe M.D.   On: 01/29/2018 19:17    Assessment & Plan:   Jeremiah Mccoy was seen today for hospitalization follow-up.  Diagnoses and all orders for this visit:  Dizziness -     meclizine (ANTIVERT) 25 MG tablet; Take 1 tablet (25 mg total) by mouth 3 (three) times daily as needed for dizziness.  Hospital discharge follow-up  Bronchitis -     azithromycin (ZITHROMAX) 250 MG tablet; Take 2 today and then one each day until finished.   I have discontinued Cecile Umeda's meclizine. I am also having him start on azithromycin and meclizine. Additionally, I am having him  maintain his cyanocobalamin, atorvastatin, metFORMIN, aspirin, and clopidogrel.  Meds ordered this encounter  Medications  . azithromycin (ZITHROMAX) 250 MG tablet    Sig: Take 2 today and then one each day until finished.    Dispense:  6 tablet    Refill:  0  . meclizine (ANTIVERT) 25 MG tablet    Sig: Take 1 tablet (25 mg total) by mouth 3 (three) times daily as needed for dizziness.    Dispense:  30 tablet    Refill:  0   Encourage patient to use his walker for safety reasons.  He will try 5 to 10 mg of melatonin for insomnia.  Follow-up: Return in about 4 weeks (around 03/07/2018), or if symptoms worsen or fail to improve.  Libby Maw, MD

## 2018-02-08 ENCOUNTER — Ambulatory Visit (INDEPENDENT_AMBULATORY_CARE_PROVIDER_SITE_OTHER): Payer: Medicare Other | Admitting: Adult Health

## 2018-02-08 ENCOUNTER — Encounter: Payer: Self-pay | Admitting: Adult Health

## 2018-02-08 VITALS — BP 147/90 | HR 85 | Ht 70.0 in | Wt 201.0 lb

## 2018-02-08 DIAGNOSIS — E1121 Type 2 diabetes mellitus with diabetic nephropathy: Secondary | ICD-10-CM | POA: Diagnosis not present

## 2018-02-08 DIAGNOSIS — Z91199 Patient's noncompliance with other medical treatment and regimen due to unspecified reason: Secondary | ICD-10-CM

## 2018-02-08 DIAGNOSIS — R42 Dizziness and giddiness: Secondary | ICD-10-CM | POA: Diagnosis not present

## 2018-02-08 DIAGNOSIS — I10 Essential (primary) hypertension: Secondary | ICD-10-CM

## 2018-02-08 DIAGNOSIS — I63512 Cerebral infarction due to unspecified occlusion or stenosis of left middle cerebral artery: Secondary | ICD-10-CM | POA: Diagnosis not present

## 2018-02-08 DIAGNOSIS — Z9119 Patient's noncompliance with other medical treatment and regimen: Secondary | ICD-10-CM | POA: Diagnosis not present

## 2018-02-08 DIAGNOSIS — E785 Hyperlipidemia, unspecified: Secondary | ICD-10-CM

## 2018-02-08 DIAGNOSIS — R4189 Other symptoms and signs involving cognitive functions and awareness: Secondary | ICD-10-CM

## 2018-02-08 NOTE — Patient Instructions (Signed)
Continue clopidogrel 75 mg daily  and lipitor 40mg   for secondary stroke prevention  Stop aspirin at this time and continue plavix only  Continue to follow up with PCP regarding cholesterol, diabetes and blood pressure management   Referral placed for home health services  Highly recommend to stay active as tolerated and maintain a healthy diet  Continue to monitor blood pressure at home  Maintain strict control of hypertension with blood pressure goal below 130/90, diabetes with hemoglobin A1c goal below 6.5% and cholesterol with LDL cholesterol (bad cholesterol) goal below 70 mg/dL. I also advised the patient to eat a healthy diet with plenty of whole grains, cereals, fruits and vegetables, exercise regularly and maintain ideal body weight.  Followup in the future with me in 3 months or call earlier if needed       Thank you for coming to see [place] at [facility]. I hope we have been able to provide you high quality care today.  You may receive a patient satisfaction survey over the next few weeks. We would appreciate your feedback and comments so that we may continue to improve ourselves and the health of our patients.

## 2018-02-08 NOTE — Progress Notes (Signed)
[facility] [address] [place]. [place] [address] [phone]       OFFICE FOLLOW UP NOTE  [name] Date of Birth:  05-14-1933 Medical Record Number:  [id number]   Reason for Referral:  hospital stroke follow up  CHIEF COMPLAINT:  Chief Complaint  Patient presents with  . Follow-up    hopsital follow up for stroke room 9 pt with Jeremiah Mccoy  pt use rollator walker    HPI: Jeremiah Mccoy is being seen today for initial visit in the office for right frontal lobe infarct and left MCA infarct on 01/04/2018 and 01/29/2018. History obtained from patient, daughter and wife and chart review. Reviewed all radiology images and labs personally.  [name] is a 84 y.o. male with history of hypertension, diabetes, prostate cancer, stroke in 2018 with residual right sided paresthesias and aphasia, medical non compliance, and short term memory problems  presenting with syncope. He did not receive IV t-PA due to no focal deficits.  CT head reviewed and showed remote infarcts including right MCA, MCA/PCA and left parietal lobes.  MRI brain reviewed and was positive for punctate acute white matter infarct in the anterior right frontal lobe.  MRA head and neck negative.  Bilateral lower extremity venous Dopplers negative for DVT.  2D echo showed an EF of 60 to 65%.  LDL 112 and A1c 7.2.  He was also found to have orthostatic hypotension with orthostatic vitals showing significant BP drop while standing and was felt as though this was likely the cause of his near syncope.  Recent infarct and right frontal lobe felt to be embolic with unknown source versus small vessel disease but due to prior infarcts, it was recommended to undergo 30-day cardiac monitor. Recommended DAPT for 3 weeks then plavix alone.  He was discharged home with recommendations of home health therapies.  Return to the ED on 01/29/2018 for dizziness sensation.  MRI brain showed 5 mm early infarction in the  left superior frontal centrum semiovale consistent with left MCA infarct.  It was not recommended to undergo any further imaging as stroke work-up recently completed.  After he was discharged from prior admission with DAPT for 3 weeks, patient reported not being compliant and was recommended to be discharged on DAPT with aspirin and Plavix for 3 weeks and then Plavix alone.  Patient was discharged with recommendations of home health PT along with undergoing 30-day cardiac monitor to rule out potential atrial fibrillation  Patient is being seen today for hospital follow-up and is accompanied by wife and daughter.  Wife states that he received therapy for short period and then was discharged due to noncompliance.  He continues to experience dizziness sensations but would not cooperate with BPPV treatment along with refusing to do interventions for orthostatic hypotension such as TED hose and increasing fluid intake.  He has had a few falls at home fortunately without injury.  Wife has been caring for him providing all ADLs and IADLs which she endorses a difficult time as she is disabled herself.  He does endorse compliance with aspirin and Plavix at this time.  Continues to take atorvastatin 40 mg daily without side effects of myalgias.  He does have a history and diagnosis of OSA but refused to use CPAP machine.  He was also recommended to undergo 30-day cardiac monitor which she also refused.  Patient is ambulating with Rollator walker and did almost have a fall walking into exam room.  Wife has recently started  melatonin to help assist him in sleeping as he will get up in the middle the night and it is typically when he will fall.  He also continues to take meclizine for dizziness but this was just started yesterday and is unsure if it has been helpful.  No further concerns at this time.  Denies new or worsening stroke/TIA symptoms.    ROS:   14 system review of systems performed and negative with exception  of fatigue, spinning sensation, cough, snoring, incontinence, urination problems, incontinence, easy bruising, feeling hot, allergies, runny nose, memory loss, confusion, slurred speech, dizziness, depression, decreased energy, disinterested activities and insomnia  PMH:  Past Medical History:  Diagnosis Date  . Cancer ([place])   . Depression   . Diabetes mellitus without complication ([place])   . Prostate cancer ([place])   . Stroke [facility])     PSH:  Past Surgical History:  Procedure Laterality Date  . APPENDECTOMY    . CHOLECYSTECTOMY    . PROSTATECTOMY      Social History:  Social History   Socioeconomic History  . Marital status: Married    Spouse name: Not on file  . Number of children: Not on file  . Years of education: Not on file  . Highest education level: Not on file  Occupational History  . Occupation: retired    Comment: traffic cop for 22 years  Social Needs  . Financial resource strain: Not on file  . Food insecurity:    Worry: Not on file    Inability: Not on file  . Transportation needs:    Medical: Not on file    Non-medical: Not on file  Tobacco Use  . Smoking status: Never Smoker  . Smokeless tobacco: Never Used  Substance and Sexual Activity  . Alcohol use: No    Frequency: Never  . Drug use: No  . Sexual activity: Never  Lifestyle  . Physical activity:    Days per week: Not on file    Minutes per session: Not on file  . Stress: Not on file  Relationships  . Social connections:    Talks on phone: Not on file    Gets together: Not on file    Attends religious service: Not on file    Active member of club or organization: Not on file    Attends meetings of clubs or organizations: Not on file    Relationship status: Not on file  . Intimate partner violence:    Fear of current or ex partner: Not on file    Emotionally abused: Not on file    Physically abused: Not on file    Forced sexual activity: Not on file  Other Topics Concern  . Not on file    Social History Narrative   Married for 57 years (2019). Moved to [place] in November 2018 to be closer to their daughter. Patient from [employer] [facility] [employer]. 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     Family History:  Family History  Problem Relation Age of Onset  . Colon cancer Mother   . Stroke Father   . Diabetes Brother   . Breast cancer Neg Hx   . Prostate cancer Neg Hx   . Pancreatic cancer Neg Hx     Medications:   Current Outpatient Medications on File Prior to Visit  Medication Sig Dispense Refill  . atorvastatin (LIPITOR) 40 MG tablet Take 1 tablet (40 mg total) by mouth daily at 6 PM. 30 tablet 2  .  clopidogrel (PLAVIX) 75 MG tablet Take 1 tablet (75 mg total) by mouth daily. 30 tablet 0  . meclizine (ANTIVERT) 25 MG tablet Take 1 tablet (25 mg total) by mouth 3 (three) times daily as needed for dizziness. 30 tablet 0  . metFORMIN (GLUCOPHAGE) 500 MG tablet TAKE 1 TABLET BY MOUTH TWICE DAILY WITH MEAL (Patient taking differently: Take 500 mg by mouth 2 (two) times daily with a meal. TAKE 1 TABLET BY MOUTH TWICE DAILY WITH MEAL) 60 tablet 2  . vitamin B-12 1000 MCG tablet Take 1 tablet (1,000 mcg total) by mouth daily. 30 tablet 0   No current facility-administered medications on file prior to visit.     Allergies:   Allergies  Allergen Reactions  . Morphine And Related Other (See Comments)  . Motrin [Ibuprofen] Other (See Comments)    Dry heaves  . Sulfa Antibiotics Other (See Comments)    Dry heaves     Physical Exam  Vitals:   02/08/18 1540  BP: (!) 147/90  Pulse: 85  Weight: 201 lb (91.2 kg)  Height: 5\' 10"  (1.778 m)   Body mass index is 28.84 kg/m. No exam data present  General: well developed, well nourished, pleasant elderly caucasian male, seated, in no evident distress Head: head normocephalic and atraumatic.   Neck: supple with no carotid or supraclavicular bruits Cardiovascular: regular rate and rhythm, no murmurs Musculoskeletal: no  deformity Skin:  no rash/petichiae Vascular:  Normal pulses all extremities  Neurologic Exam Mental Status: Awake and fully alert. oriented to place and time. Remote memory intact. Attention span, concentration and fund of knowledge appropriate. Mood and affect appropriate.  Cranial Nerves: Fundoscopic exam reveals sharp disc margins. Pupils equal, briskly reactive to light. Extraocular movements full without nystagmus. Visual fields full to confrontation. Hearing intact. Facial sensation intact. Face, tongue, palate moves normally and symmetrically.  Motor: Normal bulk and tone. Normal strength in all tested extremity muscles. Sensory.: intact to touch , pinprick , position and vibratory sensation.  Coordination: Rapid alternating movements normal in all extremities. Finger-to-nose and heel-to-shin performed accurately bilaterally. Gait and Station: Arises from chair without difficulty. Stance is normal. Gait demonstrates quick short steps with swing motion and use of Rollator walker.  Tandem gait not assessed due to difficulties with balance. Reflexes: 1+ and symmetric. Toes downgoing.    NIHSS  0 Modified Rankin  3    Diagnostic Data (Labs, Imaging, Testing)  CT HEAD WO CONTRAST 01/04/2018 IMPRESSION: No acute abnormality.  Atrophy, chronic microvascular ischemic change and remote infarcts as described above.  MR BRAIN WO CONTRAST 01/04/2018 IMPRESSION: 1. Positive for a small acute white matter infarct in the anterior right frontal lobe. 2. The examination was discontinued prior to completion by patient request and only axial diffusion weighted imaging was obtained. 3. Scattered chronic cortical infarcts in both hemispheres with encephalomalacia.  MR MRA HEAD  MR MRA NECK 01/04/2018 IMPRESSION: MRA HEAD IMPRESSION:  1. Negative intracranial MRA. No large vessel occlusion. No hemodynamically significant or correctable stenosis. 2. Mild distal small vessel  atheromatous irregularity.  MRA NECK IMPRESSION:  Negative MRA of the neck. No vascular occlusion or hemodynamically significant stenosis identified.  CT HEAD WO CONTRAST 01/29/2018 IMPRESSION: No acute findings.  Moderate chronic ischemic microvascular disease and age related atrophy. Old posterior right infarcts as described.  MR BRAIN WO CONTRAST 01/29/2018 IMPRESSION: 1. 5 mm acute/early subacute infarction within the left superior frontal centrum semiovale. No associated hemorrhage or mass effect. 2. Multiple stable chronic infarctions  as above.  ECHOCARDIOGRAM 01/05/2018 Impressions: - Technically difficult study. LVEF 60-65%, modeate LVH,   incoordinate septal motion and IVS flattening (may suggest   elevated RV pressure), grade 1 DD, elevated LV filling pressure,   mild MR, normal LA size, mild TR, RVSP 40 mmHg + RAP, IVC was not   visualized, no pericardial effusion.    ASSESSMENT: Daquon Greenleaf is a 84 y.o. year old male here with right frontal infarct and left MCA infarct on 01/04/2018 and 01/29/2018 secondary to likely small vessel disease but due to history of multiple infarcts likely related to cardiac buttock source it was recommended to undergo 30-day cardiac monitor vascular risk factors include HTN, HLD, DM, OSA, orthostatic hypotension, BPPV, prior infarcts, dementia, and noncompliance.  Patient is being seen today for hospital follow-up and does continue to have intermittent dizziness and worsening cognition but denies any new or worsening stroke/TIA symptoms.    PLAN:  1. Right frontal and left MCA infarct: Continue aspirin 81 mg daily and clopidogrel 75 mg daily  and Lipitor for secondary stroke prevention.  Recommended to continue aspirin and Plavix for 3 weeks and then continue Plavix alone.  Highly encourage compliance with these medications to prevent future strokes.  Discussion regarding 30-day cardiac monitor as patient has declined.  Most likely  not a good candidate for long term AC due to frequent falls and dementia with frequent noncompliance.  Maintain strict control of hypertension with blood pressure goal below 130/90, diabetes with hemoglobin A1c goal below 6.5% and cholesterol with LDL cholesterol (bad cholesterol) goal below 70 mg/dL.  I also advised the patient to eat a healthy diet with plenty of whole grains, cereals, fruits and vegetables, exercise regularly with at least 30 minutes of continuous activity daily and maintain ideal body weight. 2. Stroke deficits: Placed order for home health PT/OT/ST and nursing due to continued cognitive deficits, dizziness and safety with assistive device due to frequent falls.  May need to frequent nursing aide care to assist wife with daily ADLs.  Attempted education regarding safety and interventions recommended but patient stated he most likely will not follow recommendations such as orthostatic hypotension recommendations, safety recommendations and future stroke prevention recommendations. 3. HTN: Advised to continue current treatment regimen.  Today's BP 153/89.  Advised to continue to monitor at home along with continued follow-up with PCP for management 4. HLD: Advised to continue current treatment regimen along with continued follow-up with PCP for future prescribing and monitoring of lipid panel 5. DMII: Advised to continue to monitor glucose levels at home along with continued follow-up with PCP for management and monitoring     Follow up in 3 months or call earlier if needed   Greater than 50% of time during this 25 minute visit was spent on counseling, explanation of diagnosis of right frontal infarct and left MCA infarct, reviewing risk factor management of HTN, HLD, DM, dementia, history of noncompliance, planning of further management along with potential future management, and discussion with patient and family answering all questions.    Jeremiah Mccoy, AGNP-BC  [facility] [address] [place], [place] [address]  Phone [phone] Fax [phone] Note: This document was prepared with digital dictation and possible smart phrase technology. Any transcriptional errors that result from this process are unintentional.

## 2018-02-09 ENCOUNTER — Telehealth: Payer: Self-pay | Admitting: Family Medicine

## 2018-02-09 NOTE — Telephone Encounter (Signed)
Copied from [place] [phone] [phone]. Topic: Quick Communication - Home Health Verbal Orders >> Feb 09, 2018 10:50 AM Percell Belt A wrote: Caller/Agency: Wilhemena Durie with [employer] Number: [phone] Requesting OT/PT/Skilled Nursing/Social Work: Need verbals PT at home - pt will stay at home can not afford placement  Frequency: 2 week 4

## 2018-02-09 NOTE — Telephone Encounter (Signed)
Please advise 

## 2018-02-09 NOTE — Telephone Encounter (Signed)
Okay.  Thanks.

## 2018-02-10 NOTE — Progress Notes (Signed)
I agree with the above plan 

## 2018-02-12 NOTE — Telephone Encounter (Signed)
Verbal orders given-verbalized understanding.

## 2018-02-14 DIAGNOSIS — I69351 Hemiplegia and hemiparesis following cerebral infarction affecting right dominant side: Secondary | ICD-10-CM | POA: Diagnosis not present

## 2018-02-14 DIAGNOSIS — D649 Anemia, unspecified: Secondary | ICD-10-CM | POA: Diagnosis not present

## 2018-02-14 DIAGNOSIS — E119 Type 2 diabetes mellitus without complications: Secondary | ICD-10-CM | POA: Diagnosis not present

## 2018-02-14 DIAGNOSIS — I10 Essential (primary) hypertension: Secondary | ICD-10-CM | POA: Diagnosis not present

## 2018-02-14 DIAGNOSIS — C61 Malignant neoplasm of prostate: Secondary | ICD-10-CM | POA: Diagnosis not present

## 2018-02-14 DIAGNOSIS — I69320 Aphasia following cerebral infarction: Secondary | ICD-10-CM | POA: Diagnosis not present

## 2018-02-15 ENCOUNTER — Encounter: Payer: Self-pay | Admitting: Family Medicine

## 2018-02-15 ENCOUNTER — Other Ambulatory Visit: Payer: Self-pay

## 2018-02-15 ENCOUNTER — Ambulatory Visit (INDEPENDENT_AMBULATORY_CARE_PROVIDER_SITE_OTHER): Payer: Medicare Other | Admitting: Family Medicine

## 2018-02-15 VITALS — BP 130/80 | HR 81 | Temp 97.5°F | Ht 70.0 in | Wt 202.5 lb

## 2018-02-15 DIAGNOSIS — G4700 Insomnia, unspecified: Secondary | ICD-10-CM | POA: Diagnosis not present

## 2018-02-15 NOTE — Patient Outreach (Signed)
Jeremiah Mccoy) Care Management  02/15/2018  Jeremiah Mccoy 01-Jan-1934 [id number]     EMMI-STROKE RED ON EMMI ALERT Day # 13 Date: 02/14/18 Red Alert Reason: " Feeling worse overall? Yes"   Outreach attempt # 1 to patient. Spoke with spouse(DPR on file). She voices that both her and patient have been answering automated calls. Reviewed and addressed red alert with spouse. She states that patient answered the call on yesterday. She voices error in response. She reports that patient has been doing well and has had no worsening issues. She reports that ST was out to visit with patient on yesterday and patient is making progress. She denies any further RN CM needs or concerns at this time. Patient has completed post discharge automated EMMI-stroke calls.        Plan: RN CM will close case as no further interventions needed at this time.    Enzo Montgomery, RN,BSN,CCM [facility] Telephonic Care Management Coordinator Direct Phone: [phone] Toll Free: [phone] Fax: [phone]

## 2018-02-15 NOTE — Progress Notes (Signed)
Established Patient Office Visit  Subjective:  Patient ID: Jeremiah Mccoy, male    DOB: Mar 09, 1934  Age: 84 y.o. MRN: [id number]  CC:  Chief Complaint  Patient presents with  . Follow-up    HPI Jeremiah Mccoy presents for follow up post hospital observation for a mild stoke about 3 weeks ago. Mr. Mossberg is no longer experiencing the dizziness, he has stopped the Meclizine. He is taking the Melatonin 5 mg at night time, around sundown, but he is still waking up throughout the night. He has not had anymore falls, but he has had a few close calls. He is using his walker and cane as needed.  Continues to work with PT and OT.  His wife tells me that he really has not been putting forth effort that he could.  Patient tells me that he is not particularly interested in the things that they want him to do.  Visiting home health nurses with his current company have discontinued seeing him because they state that the patient's wife is managing his medicines.  Wife is needing help with patient for his ADLs.  Neurology is ordered visiting home health nurse care through another company, [employer].   Past Medical History:  Diagnosis Date  . Cancer ([place])   . Depression   . Diabetes mellitus without complication ([place])   . Prostate cancer ([place])   . Stroke [facility])     Past Surgical History:  Procedure Laterality Date  . APPENDECTOMY    . CHOLECYSTECTOMY    . PROSTATECTOMY      Family History  Problem Relation Age of Onset  . Colon cancer Mother   . Stroke Father   . Diabetes Brother   . Breast cancer Neg Hx   . Prostate cancer Neg Hx   . Pancreatic cancer Neg Hx     Social History   Socioeconomic History  . Marital status: Married    Spouse name: Not on file  . Number of children: Not on file  . Years of education: Not on file  . Highest education level: Not on file  Occupational History  . Occupation: retired    Comment: traffic cop for 22 years  Social Needs  . Financial resource  strain: Not on file  . Food insecurity:    Worry: Not on file    Inability: Not on file  . Transportation needs:    Medical: Not on file    Non-medical: Not on file  Tobacco Use  . Smoking status: Never Smoker  . Smokeless tobacco: Never Used  Substance and Sexual Activity  . Alcohol use: No    Frequency: Never  . Drug use: No  . Sexual activity: Never  Lifestyle  . Physical activity:    Days per week: Not on file    Minutes per session: Not on file  . Stress: Not on file  Relationships  . Social connections:    Talks on phone: Not on file    Gets together: Not on file    Attends religious service: Not on file    Active member of club or organization: Not on file    Attends meetings of clubs or organizations: Not on file    Relationship status: Not on file  . Intimate partner violence:    Fear of current or ex partner: Not on file    Emotionally abused: Not on file    Physically abused: Not on file    Forced sexual activity: Not on file  Other Topics Concern  . Not on file  Social History Narrative   Married for 57 years (2019). Moved to [place] in November 2018 to be closer to their daughter. Patient from [facility]. 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     Outpatient Medications Prior to Visit  Medication Sig Dispense Refill  . atorvastatin (LIPITOR) 40 MG tablet Take 1 tablet (40 mg total) by mouth daily at 6 PM. 30 tablet 2  . clopidogrel (PLAVIX) 75 MG tablet Take 1 tablet (75 mg total) by mouth daily. 30 tablet 0  . metFORMIN (GLUCOPHAGE) 500 MG tablet TAKE 1 TABLET BY MOUTH TWICE DAILY WITH MEAL (Patient taking differently: Take 500 mg by mouth 2 (two) times daily with a meal. TAKE 1 TABLET BY MOUTH TWICE DAILY WITH MEAL) 60 tablet 2  . vitamin B-12 1000 MCG tablet Take 1 tablet (1,000 mcg total) by mouth daily. 30 tablet 0  . meclizine (ANTIVERT) 25 MG tablet Take 1 tablet (25 mg total) by mouth 3 (three) times daily as needed for dizziness. 30 tablet 0    No facility-administered medications prior to visit.     Allergies  Allergen Reactions  . Morphine And Related Other (See Comments)  . Motrin [Ibuprofen] Other (See Comments)    Dry heaves  . Sulfa Antibiotics Other (See Comments)    Dry heaves    ROS Review of Systems  Constitutional: Negative.  Negative for chills, diaphoresis, fever and unexpected weight change.  HENT: Positive for postnasal drip. Negative for rhinorrhea, sinus pressure and sinus pain.   Eyes: Negative for photophobia and visual disturbance.  Respiratory: Positive for cough. Negative for chest tightness, shortness of breath and wheezing.   Cardiovascular: Negative.   Musculoskeletal: Positive for gait problem.  Skin: Negative for pallor and rash.  Allergic/Immunologic: Negative for immunocompromised state.  Psychiatric/Behavioral: Positive for decreased concentration. Negative for agitation and behavioral problems.      Objective:    Physical Exam  Constitutional: He is oriented to person, place, and time. He appears well-developed and well-nourished. No distress.  HENT:  Head: Normocephalic and atraumatic.  Right Ear: External ear normal.  Left Ear: External ear normal.  Eyes: Right eye exhibits no discharge. Left eye exhibits no discharge. No scleral icterus.  Neck: No JVD present. No tracheal deviation present.  Cardiovascular: Normal rate, regular rhythm and normal heart sounds.  Occasional extrasystoles are present.  Pulmonary/Chest: Effort normal and breath sounds normal. No stridor. No respiratory distress. He has no rales.  Abdominal: Bowel sounds are normal.  Neurological: He is alert and oriented to person, place, and time.  Skin: Skin is warm and dry. He is not diaphoretic.  Psychiatric: He has a normal mood and affect. His behavior is normal.    BP 130/80 (BP Location: Left Arm, Patient Position: Sitting, Cuff Size: Normal)   Pulse 81   Temp (!) 97.5 F (36.4 C) (Oral)   Ht 5\' 10"   (1.778 m)   Wt 202 lb 8 oz (91.9 kg)   SpO2 96%   BMI 29.06 kg/m  Wt Readings from Last 3 Encounters:  02/15/18 202 lb 8 oz (91.9 kg)  02/08/18 201 lb (91.2 kg)  02/07/18 204 lb 8 oz (92.8 kg)     Health Maintenance Due  Topic Date Due  . FOOT EXAM  05/26/1945  . OPHTHALMOLOGY EXAM  05/26/1945  . TETANUS/TDAP  05/27/1954    There are no preventive care reminders to display for this patient.  Lab Results  Component Value Date   TSH 5.474 (H) 01/05/2018   Lab Results  Component Value Date   WBC 9.6 01/29/2018   HGB 13.9 01/29/2018   HCT 41.0 01/29/2018   MCV 99.5 01/29/2018   PLT 155 01/29/2018   Lab Results  Component Value Date   NA 140 01/29/2018   K 4.4 01/29/2018   CO2 22 01/29/2018   GLUCOSE 213 (H) 01/29/2018   BUN 24 (H) 01/29/2018   CREATININE 1.10 01/29/2018   BILITOT 0.4 01/29/2018   ALKPHOS 56 01/29/2018   AST 32 01/29/2018   ALT 46 (H) 01/29/2018   PROT 6.6 01/29/2018   ALBUMIN 3.5 01/29/2018   CALCIUM 9.0 01/29/2018   ANIONGAP 6 01/29/2018   GFR 72.70 03/16/2017   Lab Results  Component Value Date   CHOL 178 01/05/2018   Lab Results  Component Value Date   HDL 38 (L) 01/05/2018   Lab Results  Component Value Date   LDLCALC 112 (H) 01/05/2018   Lab Results  Component Value Date   TRIG 142 01/05/2018   Lab Results  Component Value Date   CHOLHDL 4.7 01/05/2018   Lab Results  Component Value Date   HGBA1C 7.2 (H) 01/05/2018      Assessment & Plan:   Problem List Items Addressed This Visit      Other   Insomnia - Primary      No orders of the defined types were placed in this encounter.  Okay 10 mg of melatonin as needed for sleep. Follow-up: Return in about 3 months (around 05/18/2018).

## 2018-02-16 DIAGNOSIS — E119 Type 2 diabetes mellitus without complications: Secondary | ICD-10-CM | POA: Diagnosis not present

## 2018-02-16 DIAGNOSIS — C61 Malignant neoplasm of prostate: Secondary | ICD-10-CM | POA: Diagnosis not present

## 2018-02-16 DIAGNOSIS — I69351 Hemiplegia and hemiparesis following cerebral infarction affecting right dominant side: Secondary | ICD-10-CM | POA: Diagnosis not present

## 2018-02-16 DIAGNOSIS — I69320 Aphasia following cerebral infarction: Secondary | ICD-10-CM | POA: Diagnosis not present

## 2018-02-16 DIAGNOSIS — D649 Anemia, unspecified: Secondary | ICD-10-CM | POA: Diagnosis not present

## 2018-02-16 DIAGNOSIS — I10 Essential (primary) hypertension: Secondary | ICD-10-CM | POA: Diagnosis not present

## 2018-02-19 DIAGNOSIS — D649 Anemia, unspecified: Secondary | ICD-10-CM | POA: Diagnosis not present

## 2018-02-19 DIAGNOSIS — C61 Malignant neoplasm of prostate: Secondary | ICD-10-CM | POA: Diagnosis not present

## 2018-02-19 DIAGNOSIS — I69320 Aphasia following cerebral infarction: Secondary | ICD-10-CM | POA: Diagnosis not present

## 2018-02-19 DIAGNOSIS — I10 Essential (primary) hypertension: Secondary | ICD-10-CM | POA: Diagnosis not present

## 2018-02-19 DIAGNOSIS — E119 Type 2 diabetes mellitus without complications: Secondary | ICD-10-CM | POA: Diagnosis not present

## 2018-02-19 DIAGNOSIS — I69351 Hemiplegia and hemiparesis following cerebral infarction affecting right dominant side: Secondary | ICD-10-CM | POA: Diagnosis not present

## 2018-02-20 DIAGNOSIS — D649 Anemia, unspecified: Secondary | ICD-10-CM | POA: Diagnosis not present

## 2018-02-20 DIAGNOSIS — I69351 Hemiplegia and hemiparesis following cerebral infarction affecting right dominant side: Secondary | ICD-10-CM | POA: Diagnosis not present

## 2018-02-20 DIAGNOSIS — E119 Type 2 diabetes mellitus without complications: Secondary | ICD-10-CM | POA: Diagnosis not present

## 2018-02-20 DIAGNOSIS — I10 Essential (primary) hypertension: Secondary | ICD-10-CM | POA: Diagnosis not present

## 2018-02-20 DIAGNOSIS — C61 Malignant neoplasm of prostate: Secondary | ICD-10-CM | POA: Diagnosis not present

## 2018-02-20 DIAGNOSIS — I69320 Aphasia following cerebral infarction: Secondary | ICD-10-CM | POA: Diagnosis not present

## 2018-02-21 DIAGNOSIS — I10 Essential (primary) hypertension: Secondary | ICD-10-CM | POA: Diagnosis not present

## 2018-02-21 DIAGNOSIS — I69351 Hemiplegia and hemiparesis following cerebral infarction affecting right dominant side: Secondary | ICD-10-CM | POA: Diagnosis not present

## 2018-02-21 DIAGNOSIS — E119 Type 2 diabetes mellitus without complications: Secondary | ICD-10-CM | POA: Diagnosis not present

## 2018-02-21 DIAGNOSIS — C61 Malignant neoplasm of prostate: Secondary | ICD-10-CM | POA: Diagnosis not present

## 2018-02-21 DIAGNOSIS — D649 Anemia, unspecified: Secondary | ICD-10-CM | POA: Diagnosis not present

## 2018-02-21 DIAGNOSIS — I69320 Aphasia following cerebral infarction: Secondary | ICD-10-CM | POA: Diagnosis not present

## 2018-02-22 DIAGNOSIS — C61 Malignant neoplasm of prostate: Secondary | ICD-10-CM | POA: Diagnosis not present

## 2018-02-22 DIAGNOSIS — I10 Essential (primary) hypertension: Secondary | ICD-10-CM | POA: Diagnosis not present

## 2018-02-22 DIAGNOSIS — I69351 Hemiplegia and hemiparesis following cerebral infarction affecting right dominant side: Secondary | ICD-10-CM | POA: Diagnosis not present

## 2018-02-22 DIAGNOSIS — D649 Anemia, unspecified: Secondary | ICD-10-CM | POA: Diagnosis not present

## 2018-02-22 DIAGNOSIS — I69320 Aphasia following cerebral infarction: Secondary | ICD-10-CM | POA: Diagnosis not present

## 2018-02-22 DIAGNOSIS — E119 Type 2 diabetes mellitus without complications: Secondary | ICD-10-CM | POA: Diagnosis not present

## 2018-02-27 DIAGNOSIS — C61 Malignant neoplasm of prostate: Secondary | ICD-10-CM | POA: Diagnosis not present

## 2018-02-27 DIAGNOSIS — E119 Type 2 diabetes mellitus without complications: Secondary | ICD-10-CM | POA: Diagnosis not present

## 2018-02-27 DIAGNOSIS — I69320 Aphasia following cerebral infarction: Secondary | ICD-10-CM | POA: Diagnosis not present

## 2018-02-27 DIAGNOSIS — I10 Essential (primary) hypertension: Secondary | ICD-10-CM | POA: Diagnosis not present

## 2018-02-27 DIAGNOSIS — I69351 Hemiplegia and hemiparesis following cerebral infarction affecting right dominant side: Secondary | ICD-10-CM | POA: Diagnosis not present

## 2018-02-27 DIAGNOSIS — D649 Anemia, unspecified: Secondary | ICD-10-CM | POA: Diagnosis not present

## 2018-02-28 DIAGNOSIS — C61 Malignant neoplasm of prostate: Secondary | ICD-10-CM | POA: Diagnosis not present

## 2018-02-28 DIAGNOSIS — I69351 Hemiplegia and hemiparesis following cerebral infarction affecting right dominant side: Secondary | ICD-10-CM | POA: Diagnosis not present

## 2018-02-28 DIAGNOSIS — I10 Essential (primary) hypertension: Secondary | ICD-10-CM | POA: Diagnosis not present

## 2018-02-28 DIAGNOSIS — I69320 Aphasia following cerebral infarction: Secondary | ICD-10-CM | POA: Diagnosis not present

## 2018-02-28 DIAGNOSIS — D649 Anemia, unspecified: Secondary | ICD-10-CM | POA: Diagnosis not present

## 2018-02-28 DIAGNOSIS — E119 Type 2 diabetes mellitus without complications: Secondary | ICD-10-CM | POA: Diagnosis not present

## 2018-03-02 DIAGNOSIS — D649 Anemia, unspecified: Secondary | ICD-10-CM | POA: Diagnosis not present

## 2018-03-02 DIAGNOSIS — E119 Type 2 diabetes mellitus without complications: Secondary | ICD-10-CM | POA: Diagnosis not present

## 2018-03-02 DIAGNOSIS — I10 Essential (primary) hypertension: Secondary | ICD-10-CM | POA: Diagnosis not present

## 2018-03-02 DIAGNOSIS — I69320 Aphasia following cerebral infarction: Secondary | ICD-10-CM | POA: Diagnosis not present

## 2018-03-02 DIAGNOSIS — I69351 Hemiplegia and hemiparesis following cerebral infarction affecting right dominant side: Secondary | ICD-10-CM | POA: Diagnosis not present

## 2018-03-02 DIAGNOSIS — C61 Malignant neoplasm of prostate: Secondary | ICD-10-CM | POA: Diagnosis not present

## 2018-03-05 DIAGNOSIS — I69351 Hemiplegia and hemiparesis following cerebral infarction affecting right dominant side: Secondary | ICD-10-CM | POA: Diagnosis not present

## 2018-03-05 DIAGNOSIS — I69320 Aphasia following cerebral infarction: Secondary | ICD-10-CM | POA: Diagnosis not present

## 2018-03-05 DIAGNOSIS — E119 Type 2 diabetes mellitus without complications: Secondary | ICD-10-CM | POA: Diagnosis not present

## 2018-03-05 DIAGNOSIS — C61 Malignant neoplasm of prostate: Secondary | ICD-10-CM | POA: Diagnosis not present

## 2018-03-05 DIAGNOSIS — I10 Essential (primary) hypertension: Secondary | ICD-10-CM | POA: Diagnosis not present

## 2018-03-05 DIAGNOSIS — D649 Anemia, unspecified: Secondary | ICD-10-CM | POA: Diagnosis not present

## 2018-03-07 ENCOUNTER — Ambulatory Visit (INDEPENDENT_AMBULATORY_CARE_PROVIDER_SITE_OTHER): Payer: Medicare Other | Admitting: Podiatry

## 2018-03-07 DIAGNOSIS — E1151 Type 2 diabetes mellitus with diabetic peripheral angiopathy without gangrene: Secondary | ICD-10-CM

## 2018-03-07 DIAGNOSIS — I69351 Hemiplegia and hemiparesis following cerebral infarction affecting right dominant side: Secondary | ICD-10-CM | POA: Diagnosis not present

## 2018-03-07 DIAGNOSIS — I10 Essential (primary) hypertension: Secondary | ICD-10-CM | POA: Diagnosis not present

## 2018-03-07 DIAGNOSIS — D649 Anemia, unspecified: Secondary | ICD-10-CM | POA: Diagnosis not present

## 2018-03-07 DIAGNOSIS — C61 Malignant neoplasm of prostate: Secondary | ICD-10-CM | POA: Diagnosis not present

## 2018-03-07 DIAGNOSIS — I69320 Aphasia following cerebral infarction: Secondary | ICD-10-CM | POA: Diagnosis not present

## 2018-03-07 DIAGNOSIS — B351 Tinea unguium: Secondary | ICD-10-CM | POA: Diagnosis not present

## 2018-03-07 DIAGNOSIS — E119 Type 2 diabetes mellitus without complications: Secondary | ICD-10-CM | POA: Diagnosis not present

## 2018-03-07 NOTE — Patient Instructions (Addendum)
Diabetes and Foot Care Diabetes may cause you to have problems because of poor blood supply (circulation) to your feet and legs. This may cause the skin on your feet to become thinner, break easier, and heal more slowly. Your skin may become dry, and the skin may peel and crack. You may also have nerve damage in your legs and feet causing decreased feeling in them. You may not notice minor injuries to your feet that could lead to infections or more serious problems. Taking care of your feet is one of the most important things you can do for yourself. Follow these instructions at home:  Wear shoes at all times, even in the house. Do not go barefoot. Bare feet are easily injured.  Check your feet daily for blisters, cuts, and redness. If you cannot see the bottom of your feet, use a mirror or ask someone for help.  Wash your feet with warm water (do not use hot water) and mild soap. Then pat your feet and the areas between your toes until they are completely dry. Do not soak your feet as this can dry your skin.  Apply a moisturizing lotion or petroleum jelly (that does not contain alcohol and is unscented) to the skin on your feet and to dry, brittle toenails. Do not apply lotion between your toes.  Trim your toenails straight across. Do not dig under them or around the cuticle. File the edges of your nails with an emery board or nail file.  Do not cut corns or calluses or try to remove them with medicine.  Wear clean socks or stockings every day. Make sure they are not too tight. Do not wear knee-high stockings since they may decrease blood flow to your legs.  Wear shoes that fit properly and have enough cushioning. To break in new shoes, wear them for just a few hours a day. This prevents you from injuring your feet. Always look in your shoes before you put them on to be sure there are no objects inside.  Do not cross your legs. This may decrease the blood flow to your feet.  If you find a  minor scrape, cut, or break in the skin on your feet, keep it and the skin around it clean and dry. These areas may be cleansed with mild soap and water. Do not cleanse the area with peroxide, alcohol, or iodine.  When you remove an adhesive bandage, be sure not to damage the skin around it.  If you have a wound, look at it several times a day to make sure it is healing.  Do not use heating pads or hot water bottles. They may burn your skin. If you have lost feeling in your feet or legs, you may not know it is happening until it is too late.  Make sure your health care provider performs a complete foot exam at least annually or more often if you have foot problems. Report any cuts, sores, or bruises to your health care provider immediately. Contact a health care provider if:  You have an injury that is not healing.  You have cuts or breaks in the skin.  You have an ingrown nail.  You notice redness on your legs or feet.  You feel burning or tingling in your legs or feet.  You have pain or cramps in your legs and feet.  Your legs or feet are numb.  Your feet always feel cold. Get help right away if:  There is increasing   redness, swelling, or pain in or around a wound.  There is a red line that goes up your leg.  Pus is coming from a wound.  You develop a fever or as directed by your health care provider.  You notice a bad smell coming from an ulcer or wound. This information is not intended to replace advice given to you by your health care provider. Make sure you discuss any questions you have with your health care provider. Document Released: 03/11/2000 Document Revised: 08/20/2015 Document Reviewed: 08/21/2012 Elsevier Interactive Patient Education  2017 [employer]  Diabetic Neuropathy Diabetic neuropathy is a nerve disease or nerve damage that is caused by diabetes mellitus. About half of all people with diabetes mellitus have some form of nerve damage. Nerve damage  is more common in those who have had diabetes mellitus for many years and who generally have not had good control of their blood sugar (glucose) level. Diabetic neuropathy is a common complication of diabetes mellitus. There are three common types of diabetic neuropathy and a fourth type that is less common and less understood:  Peripheral neuropathy-This is the most common type of diabetic neuropathy. It causes damage to the nerves of the feet and legs first and then eventually the hands and arms. The damage affects the ability to sense touch.  Autonomic neuropathy-This type causes damage to the autonomic nervous system, which controls the following functions: ? Heartbeat. ? Body temperature. ? Blood pressure. ? Urination. ? Digestion. ? Sweating. ? Sexual function.  Focal neuropathy-Focal neuropathy can be painful and unpredictable and occurs most often in older adults with diabetes mellitus. It involves a specific nerve or one area and often comes on suddenly. It usually does not cause long-term problems.  Radiculoplexus neuropathy- Sometimes called lumbosacral radiculoplexus neuropathy, radiculoplexus neuropathy affects the nerves of the thighs, hips, buttocks, or legs. It is more common in people with type 2 diabetes mellitus and in older men. It is characterized by debilitating pain, weakness, and atrophy, usually in the thigh muscles.  What are the causes? The cause of peripheral, autonomic, and focal neuropathies is diabetes mellitus that is uncontrolled and high glucose levels. The cause of radiculoplexus neuropathy is unknown. However, it is thought to be caused by inflammation related to uncontrolled glucose levels. What are the signs or symptoms? Peripheral Neuropathy Peripheral neuropathy develops slowly over time. When the nerves of the feet and legs no longer work there may be:  Burning, stabbing, or aching pain in the legs or feet.  Inability to feel pressure or pain in your  feet. This can lead to: ? Thick calluses over pressure areas. ? Pressure sores. ? Ulcers.  Foot deformities.  Reduced ability to feel temperature changes.  Muscle weakness.  Autonomic Neuropathy The symptoms of autonomic neuropathy vary depending on which nerves are affected. Symptoms may include:  Problems with digestion, such as: ? Feeling sick to your stomach (nausea). ? Vomiting. ? Bloating. ? Constipation. ? Diarrhea. ? Abdominal pain.  Difficulty with urination. This occurs if you lose your ability to sense when your bladder is full. Problems include: ? Urine leakage (incontinence). ? Inability to empty your bladder completely (retention).  Rapid or irregular heartbeat (palpitations).  Blood pressure drops when you stand up (orthostatic hypotension). When you stand up you may feel: ? Dizzy. ? Weak. ? Faint.  In men, inability to attain and maintain an erection.  In women, vaginal dryness and problems with decreased sexual desire and arousal.  Problems with body temperature   regulation.  Increased or decreased sweating.  Focal Neuropathy  Abnormal eye movements or abnormal alignment of both eyes.  Weakness in the wrist.  Foot drop. This results in an inability to lift the foot properly and abnormal walking or foot movement.  Paralysis on one side of your face (Bell palsy).  Chest or abdominal pain. Radiculoplexus Neuropathy  Sudden, severe pain in your hip, thigh, or buttocks.  Weakness and wasting of thigh muscles.  Difficulty rising from a seated position.  Abdominal swelling.  Unexplained weight loss (usually more than 10 lb [4.5 kg]). How is this diagnosed? Peripheral Neuropathy Your senses may be tested. Sensory function testing can be done with:  A light touch using a monofilament.  A vibration with tuning fork.  A sharp sensation with a pin prick.  Other tests that can help diagnose neuropathy are:  Nerve conduction velocity. This  test checks the transmission of an electrical current through a nerve.  Electromyography. This shows how muscles respond to electrical signals transmitted by nearby nerves.  Quantitative sensory testing. This is used to assess how your nerves respond to vibrations and changes in temperature.  Autonomic Neuropathy Diagnosis is often based on reported symptoms. Tell your health care provider if you experience:  Dizziness.  Constipation.  Diarrhea.  Inappropriate urination or inability to urinate.  Inability to get or maintain an erection.  Tests that may be done include:  Electrocardiography or Holter monitor. These are tests that can help show problems with the heart rate or heart rhythm.  An X-ray exam may be done.  Focal Neuropathy Diagnosis is made based on your symptoms and what your health care provider finds during your exam. Other tests may be done. They may include:  Nerve conduction velocities. This checks the transmission of electrical current through a nerve.  Electromyography. This shows how muscles respond to electrical signals transmitted by nearby nerves.  Quantitative sensory testing. This test is used to assess how your nerves respond to vibration and changes in temperature.  Radiculoplexus Neuropathy  Often the first thing is to eliminate any other issue or problems that might be the cause, as there is no standard test for diagnosis.  X-ray exam of your spine and lumbar region.  Spinal tap to rule out cancer.  MRI to rule out other lesions. How is this treated? Once nerve damage occurs, it cannot be reversed. The goal of treatment is to keep the disease or nerve damage from getting worse and affecting more nerve fibers. Controlling your blood glucose level is the key. Most people with radiculoplexus neuropathy see at least a partial improvement over time. You will need to keep your blood glucose and HbA1c levels in the target range determined by your  health care provider. Things that help control blood glucose levels include:  Blood glucose monitoring.  Meal planning.  Physical activity.  Diabetes medicine.  Over time, maintaining lower blood glucose levels helps lessen symptoms. Sometimes, prescription pain medicine is needed. Follow these instructions at home:  Do not smoke.  Keep your blood glucose level in the range that you and your health care provider have determined acceptable for you.  Keep your blood pressure level in the range that you and your health care provider have determined acceptable for you.  Eat a well-balanced diet.  Be physically active every day. Include strength training and balance exercises.  Protect your feet. ? Check your feet every day for sores, cuts, blisters, or signs of infection. ? Wear padded socks   and supportive shoes. Use orthotic inserts, if necessary. ? Regularly check the insides of your shoes for worn spots. Make sure there are no rocks or other items inside your shoes before you put them on. Contact a health care provider if:  You have burning, stabbing, or aching pain in the legs or feet.  You are unable to feel pressure or pain in your feet.  You develop problems with digestion such as: ? Nausea. ? Vomiting. ? Bloating. ? Constipation. ? Diarrhea. ? Abdominal pain.  You have difficulty with urination, such as: ? Incontinence. ? Retention.  You have palpitations.  You develop orthostatic hypotension. When you stand up you may feel: ? Dizzy. ? Weak. ? Faint.  You cannot attain and maintain an erection (in men).  You have vaginal dryness and problems with decreased sexual desire and arousal (in women).  You have severe pain in your thighs, legs, or buttocks.  You have unexplained weight loss. This information is not intended to replace advice given to you by your health care provider. Make sure you discuss any questions you have with your health care  provider. Document Released: 05/23/2001 Document Revised: 08/20/2015 Document Reviewed: 08/23/2012 Elsevier Interactive Patient Education  2017 [employer] Inc.  

## 2018-03-08 ENCOUNTER — Telehealth: Payer: Self-pay

## 2018-03-08 MED ORDER — TRAZODONE HCL 50 MG PO TABS: 50.0000 mg | ORAL_TABLET | Freq: Every evening | ORAL | 1 refills | Status: DC | PRN

## 2018-03-08 NOTE — Telephone Encounter (Signed)
Okay 

## 2018-03-08 NOTE — Telephone Encounter (Signed)
Trazodone 50mg  1 po q hs prn sleep. #30 1 rf.

## 2018-03-08 NOTE — Addendum Note (Signed)
Addended by: Kateri Mc E on: 03/08/2018 02:25 PM   Modules accepted: Orders

## 2018-03-08 NOTE — Telephone Encounter (Signed)
Rx sent in

## 2018-03-08 NOTE — Telephone Encounter (Signed)
Patient is requesting a refill on Trazadone; okay to refill?

## 2018-03-08 NOTE — Telephone Encounter (Signed)
Patient's speech therapist has recommended the Trazadone due to Melatonin not working for patient.

## 2018-03-08 NOTE — Telephone Encounter (Signed)
Which strength and what directions?

## 2018-03-16 ENCOUNTER — Telehealth: Payer: Self-pay | Admitting: Family Medicine

## 2018-03-16 NOTE — Telephone Encounter (Unsigned)
Copied from [place] [id number]. Topic: Quick Communication - Home Health Verbal Orders >> Mar 16, 2018  2:53 PM Carolyn Stare wrote: Caller/Agency Minda with [facility]   Callback Number   [phone]   Requesting    Pt has been discharged form [place] goals has been met   Frequency: ***

## 2018-03-16 NOTE — Telephone Encounter (Unsigned)
Copied from [place] [id number]. Topic: Quick Communication - Home Health Verbal Orders >> Mar 16, 2018  2:53 PM Carolyn Stare wrote: Caller/Agency Minda with [facility]   Callback Number   [phone]   Requesting    Pt has been discharged form [place] goals has been met   Frequency: ***

## 2018-03-16 NOTE — Telephone Encounter (Signed)
fyi

## 2018-03-16 NOTE — Telephone Encounter (Unsigned)
Copied from [place] [id number]. Topic: General - Other >> Mar 16, 2018  2:56 PM Carolyn Stare wrote:  Caller/Agency Minda with [facility]   Callback Number   [phone]     Pt has been discharged form [place] goals has been met

## 2018-03-19 ENCOUNTER — Telehealth: Payer: Self-pay | Admitting: Medical Oncology

## 2018-03-19 NOTE — Progress Notes (Signed)
Introduced myself to patient as the prostate nurse navigator. I was unable to meet him and his wife when he consulted with Dr. Tammi Klippel. He is post prostatectomy 02/01/2010. His PSA is rising and he discussed ADT and radiation with Dr. Tammi Klippel. He and his wife are in the process of selling their home in [place] and relocating to [place] to be near daughter. He would like to receive ADT now and begin radiation after her relocates. Dr. Gloriann Loan has been notified of patient's decision and will schedule ADT.

## 2018-03-19 NOTE — Telephone Encounter (Signed)
Spoke with [name]- wife to follow up with patient and his treatment decision for his prostate cancer. His wife states he is just doing ADT at this time. Since his radiation consult in July, he has had two strokes. He is doing pretty well considering. He received ADT 8/19,11/12 and is scheduled for next injection 2/12. His wife states if he decides to do radiation she will call us. I asked her to call with questions or concerns. She voiced understanding.

## 2018-04-07 ENCOUNTER — Encounter: Payer: Self-pay | Admitting: Podiatry

## 2018-04-07 NOTE — Progress Notes (Signed)
Subjective: Patient presents today with diabetes and cc of painful, discolored, thick toenails which interfere with daily activities. Pain is aggravated when wearing enclosed shoe gear. Pain is getting progressively worse and relieved with periodic professional debridement.  Libby Maw, MD is his PCP and last dos was 02/15/2018.   Current Outpatient Medications:  .  aspirin EC 81 MG tablet, Take 81 mg by mouth daily., Disp: , Rfl:  .  atorvastatin (LIPITOR) 40 MG tablet, Take 1 tablet (40 mg total) by mouth daily at 6 PM., Disp: 30 tablet, Rfl: 2 .  metFORMIN (GLUCOPHAGE) 500 MG tablet, TAKE 1 TABLET BY MOUTH TWICE DAILY WITH MEAL (Patient taking differently: Take 500 mg by mouth 2 (two) times daily with a meal. TAKE 1 TABLET BY MOUTH TWICE DAILY WITH MEAL), Disp: 60 tablet, Rfl: 2 .  vitamin B-12 1000 MCG tablet, Take 1 tablet (1,000 mcg total) by mouth daily., Disp: 30 tablet, Rfl: 0 .  clopidogrel (PLAVIX) 75 MG tablet, Take 1 tablet (75 mg total) by mouth daily. (Patient not taking: Reported on 03/07/2018), Disp: 30 tablet, Rfl: 0 .  traZODone (DESYREL) 50 MG tablet, Take 1 tablet (50 mg total) by mouth at bedtime as needed for sleep., Disp: 30 tablet, Rfl: 1   Allergies  Allergen Reactions  . Morphine And Related Other (See Comments)  . Motrin [Ibuprofen] Other (See Comments)    Dry heaves  . Sulfa Antibiotics Other (See Comments)    Dry heaves     Objective:  Vascular Examination: Capillary refill time <3 seconds x 10 digits Dorsalis pedis pulses palpable 1/4 b/l Posterior tibial pulses absent b/l No digital hair x 10 digits Skin temperature warm to cool b/l  Dermatological Examination: Skin thin, shiny and atrophic b/l  Toenails 1-5 b/l discolored, thick, dystrophic with subungual debris and pain with palpation to nailbeds due to thickness of nails.  Musculoskeletal: Muscle strength 5/5 to all LE muscle groups  Neurological: Sensation intact with 10 gram  monofilament. Vibratory sensation intact.  Assessment: 1. Painful onychomycosis toenails 1-5 b/l 2. NIDDM with Peripheral arterial disease  Plan: 1. Continue diabetic foot care principles. Literature dispensed on today. 2. Toenails 1-5 b/l were debrided in length and girth without iatrogenic bleeding. 3. Patient to continue soft, supportive shoe gear 4. Patient to report any pedal injuries to medical professional  5. Follow up 3 months. Patient/POA to call should there be a concern in the interim.

## 2018-05-03 ENCOUNTER — Ambulatory Visit: Payer: Medicare Other | Admitting: Family Medicine

## 2018-05-07 ENCOUNTER — Encounter: Payer: Self-pay | Admitting: Family Medicine

## 2018-05-07 ENCOUNTER — Ambulatory Visit (INDEPENDENT_AMBULATORY_CARE_PROVIDER_SITE_OTHER): Payer: Medicare Other | Admitting: Family Medicine

## 2018-05-07 VITALS — BP 126/80 | HR 106 | Ht 70.0 in | Wt 207.5 lb

## 2018-05-07 DIAGNOSIS — E7800 Pure hypercholesterolemia, unspecified: Secondary | ICD-10-CM

## 2018-05-07 DIAGNOSIS — R7989 Other specified abnormal findings of blood chemistry: Secondary | ICD-10-CM | POA: Diagnosis not present

## 2018-05-07 DIAGNOSIS — K5901 Slow transit constipation: Secondary | ICD-10-CM | POA: Diagnosis not present

## 2018-05-07 DIAGNOSIS — Z8546 Personal history of malignant neoplasm of prostate: Secondary | ICD-10-CM | POA: Diagnosis not present

## 2018-05-07 DIAGNOSIS — Z794 Long term (current) use of insulin: Secondary | ICD-10-CM

## 2018-05-07 DIAGNOSIS — Z8673 Personal history of transient ischemic attack (TIA), and cerebral infarction without residual deficits: Secondary | ICD-10-CM | POA: Diagnosis not present

## 2018-05-07 DIAGNOSIS — E118 Type 2 diabetes mellitus with unspecified complications: Secondary | ICD-10-CM | POA: Diagnosis not present

## 2018-05-07 DIAGNOSIS — E1121 Type 2 diabetes mellitus with diabetic nephropathy: Secondary | ICD-10-CM | POA: Diagnosis not present

## 2018-05-07 DIAGNOSIS — E785 Hyperlipidemia, unspecified: Secondary | ICD-10-CM | POA: Diagnosis not present

## 2018-05-07 DIAGNOSIS — C61 Malignant neoplasm of prostate: Secondary | ICD-10-CM

## 2018-05-07 DIAGNOSIS — E039 Hypothyroidism, unspecified: Secondary | ICD-10-CM | POA: Diagnosis not present

## 2018-05-07 DIAGNOSIS — D7589 Other specified diseases of blood and blood-forming organs: Secondary | ICD-10-CM | POA: Diagnosis not present

## 2018-05-07 LAB — COMPREHENSIVE METABOLIC PANEL
ALT: 39 U/L (ref 0–53)
AST: 23 U/L (ref 0–37)
Albumin: 4 g/dL (ref 3.5–5.2)
Alkaline Phosphatase: 70 U/L (ref 39–117)
BUN: 23 mg/dL (ref 6–23)
CO2: 25 mEq/L (ref 19–32)
CREATININE: 1.26 mg/dL (ref 0.40–1.50)
Calcium: 9.4 mg/dL (ref 8.4–10.5)
Chloride: 107 mEq/L (ref 96–112)
GFR: 54.66 mL/min — ABNORMAL LOW (ref >60.00)
Glucose, Bld: 150 mg/dL — ABNORMAL HIGH (ref 70–99)
Potassium: 5.2 mEq/L — ABNORMAL HIGH (ref 3.5–5.1)
Sodium: 145 mEq/L (ref 135–145)
Total Bilirubin: 0.4 mg/dL (ref 0.2–1.2)
Total Protein: 6.5 g/dL (ref 6.0–8.3)

## 2018-05-07 LAB — CBC
HCT: 40.7 % (ref 39.0–52.0)
Hemoglobin: 13.5 g/dL (ref 13.0–17.0)
MCHC: 33.1 g/dL (ref 30.0–36.0)
MCV: 102 fl — AB (ref 78.0–100.0)
Platelets: 171 10*3/uL (ref 150.0–400.0)
RBC: 3.99 Mil/uL — AB (ref 4.22–5.81)
RDW: 13.7 % (ref 11.5–15.5)
WBC: 9.4 10*3/uL (ref 4.0–10.5)

## 2018-05-07 LAB — HEMOGLOBIN A1C: Hgb A1c MFr Bld: 7.1 % — ABNORMAL HIGH (ref 4.6–6.5)

## 2018-05-07 LAB — LDL CHOLESTEROL, DIRECT: Direct LDL: 90 mg/dL

## 2018-05-07 LAB — PSA: PSA: 0 ng/mL — ABNORMAL LOW (ref 0.10–4.00)

## 2018-05-07 LAB — TSH: TSH: 4.03 u[IU]/mL (ref 0.35–4.50)

## 2018-05-07 MED ORDER — CLOPIDOGREL BISULFATE 75 MG PO TABS: 75.0000 mg | ORAL_TABLET | Freq: Every day | ORAL | 0 refills | Status: DC

## 2018-05-07 MED ORDER — ATORVASTATIN CALCIUM 40 MG PO TABS: 40.0000 mg | ORAL_TABLET | Freq: Every day | ORAL | 2 refills | Status: DC

## 2018-05-07 MED ORDER — METFORMIN HCL 500 MG PO TABS: 500.0000 mg | ORAL_TABLET | Freq: Two times a day (BID) | ORAL | 2 refills | Status: DC

## 2018-05-07 MED ORDER — ASPIRIN EC 81 MG PO TBEC: 81.0000 mg | DELAYED_RELEASE_TABLET | Freq: Every day | ORAL | 0 refills | Status: AC

## 2018-05-07 NOTE — Patient Instructions (Signed)

## 2018-05-07 NOTE — Progress Notes (Signed)
Established Patient Office Visit  Subjective:  Patient ID: Jeremiah Mccoy, male    DOB: 1933/09/17  Age: 85 y.o. MRN: [id number]  CC:  Chief Complaint  Patient presents with  . Follow-up    HPI Jeremiah Mccoy presents for follow-up of his diabetes, elevated LDL cholesterol status post CVA, and constipation.  Patient's wife tells me that he has been more compliant with his medicines.  He complains of constipation and has to resort to self digital rectal exam to have a bowel movement.  Patient's wife tells me that he is drinking prune juice but has refused Metamucil and stool softeners.  He is sedentary throughout the day.  Does not feel as though trazodone helps much.  He takes frequent naps throughout the day.  Continues to use cane for gait stabilization.  He did fall after getting up to use the bathroom a few weeks ago.  He was not using his cane or walker.  Past Medical History:  Diagnosis Date  . Cancer ([place])   . Depression   . Diabetes mellitus without complication ([place])   . Prostate cancer ([place])   . Stroke [facility])     Past Surgical History:  Procedure Laterality Date  . APPENDECTOMY    . CHOLECYSTECTOMY    . PROSTATECTOMY      Family History  Problem Relation Age of Onset  . Colon cancer Mother   . Stroke Father   . Diabetes Brother   . Breast cancer Neg Hx   . Prostate cancer Neg Hx   . Pancreatic cancer Neg Hx     Social History   Socioeconomic History  . Marital status: Married    Spouse name: Not on file  . Number of children: Not on file  . Years of education: Not on file  . Highest education level: Not on file  Occupational History  . Occupation: retired    Comment: traffic cop for 22 years  Social Needs  . Financial resource strain: Not on file  . Food insecurity:    Worry: Not on file    Inability: Not on file  . Transportation needs:    Medical: Not on file    Non-medical: Not on file  Tobacco Use  . Smoking status: Never Smoker  .  Smokeless tobacco: Never Used  Substance and Sexual Activity  . Alcohol use: No    Frequency: Never  . Drug use: No  . Sexual activity: Never  Lifestyle  . Physical activity:    Days per week: Not on file    Minutes per session: Not on file  . Stress: Not on file  Relationships  . Social connections:    Talks on phone: Not on file    Gets together: Not on file    Attends religious service: Not on file    Active member of club or organization: Not on file    Attends meetings of clubs or organizations: Not on file    Relationship status: Not on file  . Intimate partner violence:    Fear of current or ex partner: Not on file    Emotionally abused: Not on file    Physically abused: Not on file    Forced sexual activity: Not on file  Other Topics Concern  . Not on file  Social History Narrative   Married for 57 years (2019). Moved to [place] in November 2018 to be closer to their daughter. Patient from [facility]. 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  Outpatient Medications Prior to Visit  Medication Sig Dispense Refill  . traZODone (DESYREL) 50 MG tablet Take 1 tablet (50 mg total) by mouth at bedtime as needed for sleep. 30 tablet 1  . vitamin B-12 1000 MCG tablet Take 1 tablet (1,000 mcg total) by mouth daily. 30 tablet 0  . aspirin EC 81 MG tablet Take 81 mg by mouth daily.    Marland Kitchen atorvastatin (LIPITOR) 40 MG tablet Take 1 tablet (40 mg total) by mouth daily at 6 PM. 30 tablet 2  . clopidogrel (PLAVIX) 75 MG tablet Take 1 tablet (75 mg total) by mouth daily. 30 tablet 0  . metFORMIN (GLUCOPHAGE) 500 MG tablet TAKE 1 TABLET BY MOUTH TWICE DAILY WITH MEAL (Patient taking differently: Take 500 mg by mouth 2 (two) times daily with a meal. TAKE 1 TABLET BY MOUTH TWICE DAILY WITH MEAL) 60 tablet 2   No facility-administered medications prior to visit.     Allergies  Allergen Reactions  . Morphine And Related Other (See Comments)  . Motrin [Ibuprofen] Other (See Comments)      Dry heaves  . Sulfa Antibiotics Other (See Comments)    Dry heaves    ROS Review of Systems  Constitutional: Negative.   HENT: Negative.   Eyes: Negative for photophobia and visual disturbance.  Respiratory: Negative.   Cardiovascular: Negative.   Gastrointestinal: Positive for constipation. Negative for anal bleeding and blood in stool.  Endocrine: Negative for polyphagia and polyuria.  Musculoskeletal: Positive for gait problem. Negative for joint swelling.  Skin: Negative for pallor and rash.  Allergic/Immunologic: Negative for immunocompromised state.  Neurological: Negative for seizures and speech difficulty.  Hematological: Does not bruise/bleed easily.  Psychiatric/Behavioral: Positive for sleep disturbance.      Objective:    Physical Exam  Constitutional: He is oriented to person, place, and time. He appears well-developed and well-nourished. No distress.  HENT:  Head: Normocephalic and atraumatic.  Right Ear: External ear normal.  Left Ear: External ear normal.  Eyes: Conjunctivae are normal. Right eye exhibits no discharge. Left eye exhibits no discharge. No scleral icterus.  Neck: No JVD present. No tracheal deviation present.  Cardiovascular: Normal rate, regular rhythm and normal heart sounds.  Pulmonary/Chest: Effort normal and breath sounds normal. No stridor.  Abdominal: Soft. Bowel sounds are normal. He exhibits no distension. There is no abdominal tenderness. There is no rebound and no guarding.  Neurological: He is alert and oriented to person, place, and time.  Skin: Skin is warm and dry. He is not diaphoretic.  Psychiatric: He has a normal mood and affect. His behavior is normal.    BP 126/80   Pulse (!) 106   Ht 5\' 10"  (1.778 m)   Wt 207 lb 8 oz (94.1 kg)   SpO2 95%   BMI 29.77 kg/m  Wt Readings from Last 3 Encounters:  05/07/18 207 lb 8 oz (94.1 kg)  02/15/18 202 lb 8 oz (91.9 kg)  02/08/18 201 lb (91.2 kg)   BP Readings from Last 3  Encounters:  05/07/18 126/80  02/15/18 130/80  02/08/18 (!) 147/90   Guideline developer:  UpToDate (see UpToDate for funding source) Date Released: June 2014  Health Maintenance Due  Topic Date Due  . FOOT EXAM  05/26/1945  . OPHTHALMOLOGY EXAM  05/26/1945  . TETANUS/TDAP  05/27/1954  . URINE MICROALBUMIN  03/16/2018    There are no preventive care reminders to display for this patient.  Lab Results  Component Value Date  TSH 5.474 (H) 01/05/2018   Lab Results  Component Value Date   WBC 9.6 01/29/2018   HGB 13.9 01/29/2018   HCT 41.0 01/29/2018   MCV 99.5 01/29/2018   PLT 155 01/29/2018   Lab Results  Component Value Date   NA 140 01/29/2018   K 4.4 01/29/2018   CO2 22 01/29/2018   GLUCOSE 213 (H) 01/29/2018   BUN 24 (H) 01/29/2018   CREATININE 1.10 01/29/2018   BILITOT 0.4 01/29/2018   ALKPHOS 56 01/29/2018   AST 32 01/29/2018   ALT 46 (H) 01/29/2018   PROT 6.6 01/29/2018   ALBUMIN 3.5 01/29/2018   CALCIUM 9.0 01/29/2018   ANIONGAP 6 01/29/2018   GFR 72.70 03/16/2017   Lab Results  Component Value Date   CHOL 178 01/05/2018   Lab Results  Component Value Date   HDL 38 (L) 01/05/2018   Lab Results  Component Value Date   LDLCALC 112 (H) 01/05/2018   Lab Results  Component Value Date   TRIG 142 01/05/2018   Lab Results  Component Value Date   CHOLHDL 4.7 01/05/2018   Lab Results  Component Value Date   HGBA1C 7.2 (H) 01/05/2018      Assessment & Plan:   Problem List Items Addressed This Visit      Digestive   Slow transit constipation     Endocrine   Type 2 diabetes mellitus with complication, with long-term current use of insulin (HCC) - Primary   Relevant Medications   aspirin EC 81 MG tablet   atorvastatin (LIPITOR) 40 MG tablet   metFORMIN (GLUCOPHAGE) 500 MG tablet   Other Relevant Orders   CBC   Comprehensive metabolic panel   Urinalysis, Routine w reflex microscopic   Hemoglobin A1c   Type II diabetes mellitus with  renal manifestations (HCC)   Relevant Medications   aspirin EC 81 MG tablet   atorvastatin (LIPITOR) 40 MG tablet   metFORMIN (GLUCOPHAGE) 500 MG tablet   Other Relevant Orders   CBC   Comprehensive metabolic panel   Urinalysis, Routine w reflex microscopic   Hemoglobin A1c   Hypothyroidism   Relevant Orders   TSH     Genitourinary   Recurrent prostate cancer (HCC)   Relevant Medications   aspirin EC 81 MG tablet     Other   History of CVA (cerebrovascular accident)   Relevant Medications   aspirin EC 81 MG tablet   atorvastatin (LIPITOR) 40 MG tablet   clopidogrel (PLAVIX) 75 MG tablet   Other Relevant Orders   LDL cholesterol, direct   History of prostate cancer   Relevant Orders   PSA   Macrocytosis without anemia   Elevated LDL cholesterol level   Elevated TSH   Relevant Orders   CBC    Other Visit Diagnoses    Hyperlipidemia LDL goal <70       Relevant Medications   aspirin EC 81 MG tablet   atorvastatin (LIPITOR) 40 MG tablet   History of stroke       Relevant Medications   atorvastatin (LIPITOR) 40 MG tablet   clopidogrel (PLAVIX) 75 MG tablet      Meds ordered this encounter  Medications  . aspirin EC 81 MG tablet    Sig: Take 1 tablet (81 mg total) by mouth daily.    Dispense:  365 tablet    Refill:  0  . atorvastatin (LIPITOR) 40 MG tablet    Sig: Take 1 tablet (40 mg total) by  mouth daily at 6 PM.    Dispense:  30 tablet    Refill:  2  . DISCONTD: clopidogrel (PLAVIX) 75 MG tablet    Sig: Take 1 tablet (75 mg total) by mouth daily.    Dispense:  30 tablet    Refill:  0  . metFORMIN (GLUCOPHAGE) 500 MG tablet    Sig: Take 1 tablet (500 mg total) by mouth 2 (two) times daily with a meal. TAKE 1 TABLET BY MOUTH TWICE DAILY WITH MEAL    Dispense:  180 tablet    Refill:  2  . clopidogrel (PLAVIX) 75 MG tablet    Sig: Take 1 tablet (75 mg total) by mouth daily.    Dispense:  30 tablet    Refill:  0    Follow-up: Return in about 3 months  (around 08/05/2018).   Encouraged patient to use his cane and preferably his walker at all times when he is ambulating.  He was given information on constipation.  Encouraged him to try the Metamucil or MiraLAX.  Encouraged him to be as active as possible.

## 2018-05-08 ENCOUNTER — Other Ambulatory Visit: Payer: Self-pay | Admitting: Family Medicine

## 2018-05-08 DIAGNOSIS — Z794 Long term (current) use of insulin: Principal | ICD-10-CM

## 2018-05-08 DIAGNOSIS — E118 Type 2 diabetes mellitus with unspecified complications: Secondary | ICD-10-CM

## 2018-05-09 ENCOUNTER — Ambulatory Visit (INDEPENDENT_AMBULATORY_CARE_PROVIDER_SITE_OTHER): Payer: Medicare Other | Admitting: Podiatry

## 2018-05-09 ENCOUNTER — Encounter: Payer: Self-pay | Admitting: Podiatry

## 2018-05-09 DIAGNOSIS — E1151 Type 2 diabetes mellitus with diabetic peripheral angiopathy without gangrene: Secondary | ICD-10-CM | POA: Diagnosis not present

## 2018-05-09 DIAGNOSIS — B351 Tinea unguium: Secondary | ICD-10-CM

## 2018-05-09 DIAGNOSIS — M79676 Pain in unspecified toe(s): Secondary | ICD-10-CM

## 2018-05-09 DIAGNOSIS — Z5111 Encounter for antineoplastic chemotherapy: Secondary | ICD-10-CM | POA: Diagnosis not present

## 2018-05-09 DIAGNOSIS — C61 Malignant neoplasm of prostate: Secondary | ICD-10-CM | POA: Diagnosis not present

## 2018-05-09 NOTE — Patient Instructions (Signed)

## 2018-05-21 ENCOUNTER — Other Ambulatory Visit: Payer: Self-pay | Admitting: Family Medicine

## 2018-05-21 DIAGNOSIS — H524 Presbyopia: Secondary | ICD-10-CM | POA: Diagnosis not present

## 2018-05-21 DIAGNOSIS — E113293 Type 2 diabetes mellitus with mild nonproliferative diabetic retinopathy without macular edema, bilateral: Secondary | ICD-10-CM | POA: Diagnosis not present

## 2018-05-24 ENCOUNTER — Encounter: Payer: Self-pay | Admitting: Adult Health

## 2018-05-24 ENCOUNTER — Ambulatory Visit (INDEPENDENT_AMBULATORY_CARE_PROVIDER_SITE_OTHER): Payer: Medicare Other | Admitting: Adult Health

## 2018-05-24 VITALS — BP 127/79 | HR 80 | Ht 70.0 in | Wt 203.0 lb

## 2018-05-24 DIAGNOSIS — E785 Hyperlipidemia, unspecified: Secondary | ICD-10-CM | POA: Diagnosis not present

## 2018-05-24 DIAGNOSIS — I10 Essential (primary) hypertension: Secondary | ICD-10-CM

## 2018-05-24 DIAGNOSIS — R42 Dizziness and giddiness: Secondary | ICD-10-CM | POA: Diagnosis not present

## 2018-05-24 DIAGNOSIS — R4189 Other symptoms and signs involving cognitive functions and awareness: Secondary | ICD-10-CM

## 2018-05-24 DIAGNOSIS — E1121 Type 2 diabetes mellitus with diabetic nephropathy: Secondary | ICD-10-CM | POA: Diagnosis not present

## 2018-05-24 DIAGNOSIS — I63512 Cerebral infarction due to unspecified occlusion or stenosis of left middle cerebral artery: Secondary | ICD-10-CM

## 2018-05-24 NOTE — Progress Notes (Signed)
I agree with the above plan 

## 2018-05-24 NOTE — Patient Instructions (Signed)
Continue aspirin 81 mg daily  and lipitor  for secondary stroke prevention  Continue to follow up with PCP regarding cholesterol and blood pressure management   Highly recommend proper use of assistive devices to prevent recurrent falls  Continue to monitor blood pressure at home  Maintain strict control of hypertension with blood pressure goal below 130/90, diabetes with hemoglobin A1c goal below 6.5% and cholesterol with LDL cholesterol (bad cholesterol) goal below 70 mg/dL. I also advised the patient to eat a healthy diet with plenty of whole grains, cereals, fruits and vegetables, exercise regularly and maintain ideal body weight.  Followup in the future with me as needed or call earlier if needed       Thank you for coming to see [place] at [facility]. I hope we have been able to provide you high quality care today.  You may receive a patient satisfaction survey over the next few weeks. We would appreciate your feedback and comments so that we may continue to improve ourselves and the health of our patients.

## 2018-05-24 NOTE — Progress Notes (Signed)
[facility] [address] [place]. [place] [address] [id number]       OFFICE FOLLOW UP NOTE  Jeremiah Mccoy Date of Birth:  Jan 17, 1934 Medical Record Number:  [id number]   Reason for Referral:  hospital stroke follow up  CHIEF COMPLAINT:  Chief Complaint  Patient presents with  . Follow-up    Stroke follow up room 9 pt with wife     HPI: 05/24/18 VISIT  Jeremiah Mccoy is an 85 year old male who is being seen today for scheduled office visit regarding right frontal lobe infarct in 12/2017 and left MCA infarct in 01/2018 and is accompanied by his wife.  He has been stable from a stroke standpoint without recurring or new symptoms.  He does have residual deficits of cognitive decline and dizziness.  After prior visit, it was recommended for him to participate in outpatient therapies but he refused to do so therefore no additional therapies completed.  Wife is unsure if his memory has been stable.  He does continue to complain of dizziness and will occasionally use his cane but is not always compliant.  He did have a recent fall on Monday without sustaining any injuries.  They have since hired aide assistance who is present every Wednesday and Friday for 3 hours. he is intermittently compliant with Plavix without side effects of bleeding or bruising along with intermittently compliant atorvastatin without side effects myalgias.  Wife states he will act like he is taking them but then will find random pills around the house or in his pockets when she is doing laundry.  Attempted to perform MMSE but patient would not cooperate.  He consistently talked about different situations while he was in the [employer].  Blood pressure today 127/79.  Denies new or worsening stroke/TIA symptoms.      INITIAL VISIT 02/08/2018: Jeremiah Mccoy is being seen today for initial visit in the office for right frontal lobe infarct and left MCA infarct on 01/04/2018 and 01/29/2018. History obtained  from patient, daughter and wife and chart review. Reviewed all radiology images and labs personally.  Jeremiah Mccoy is a 85 y.o. male with history of hypertension, diabetes, prostate cancer, stroke in 2018 with residual right sided paresthesias and aphasia, medical non compliance, and short term memory problems  presenting with syncope. He did not receive IV t-PA due to no focal deficits.  CT head reviewed and showed remote infarcts including right MCA, MCA/PCA and left parietal lobes.  MRI brain reviewed and was positive for punctate acute white matter infarct in the anterior right frontal lobe.  MRA head and neck negative.  Bilateral lower extremity venous Dopplers negative for DVT.  2D echo showed an EF of 60 to 65%.  LDL 112 and A1c 7.2.  He was also found to have orthostatic hypotension with orthostatic vitals showing significant BP drop while standing and was felt as though this was likely the cause of his near syncope.  Recent infarct and right frontal lobe felt to be embolic with unknown source versus small vessel disease but due to prior infarcts, it was recommended to undergo 30-day cardiac monitor. Recommended DAPT for 3 weeks then plavix alone.  He was discharged home with recommendations of home health therapies.  Return to the ED on 01/29/2018 for dizziness sensation.  MRI brain showed 5 mm early infarction in the left superior frontal centrum semiovale consistent with left MCA infarct.  It was not recommended to undergo any further imaging as stroke work-up recently completed.  After he was discharged from prior admission with DAPT for 3 weeks, patient reported not being compliant and was recommended to be discharged on DAPT with aspirin and Plavix for 3 weeks and then Plavix alone.  Patient was discharged with recommendations of home health PT along with undergoing 30-day cardiac monitor to rule out potential atrial fibrillation  Patient is being seen today for hospital follow-up and is  accompanied by wife and daughter.  Wife states that he received therapy for short period and then was discharged due to noncompliance.  He continues to experience dizziness sensations but would not cooperate with BPPV treatment along with refusing to do interventions for orthostatic hypotension such as TED hose and increasing fluid intake.  He has had a few falls at home fortunately without injury.  Wife has been caring for him providing all ADLs and IADLs which she endorses a difficult time as she is disabled herself.  He does endorse compliance with aspirin and Plavix at this time.  Continues to take atorvastatin 40 mg daily without side effects of myalgias.  He does have a history and diagnosis of OSA but refused to use CPAP machine.  He was also recommended to undergo 30-day cardiac monitor which she also refused.  Patient is ambulating with Rollator walker and did almost have a fall walking into exam room.  Wife has recently started melatonin to help assist him in sleeping as he will get up in the middle the night and it is typically when he will fall.  He also continues to take meclizine for dizziness but this was just started yesterday and is unsure if it has been helpful.  No further concerns at this time.  Denies new or worsening stroke/TIA symptoms.    ROS:   14 system review of systems performed and negative with exception of see HPI  PMH:  Past Medical History:  Diagnosis Date  . Cancer ([place])   . Depression   . Diabetes mellitus without complication ([place])   . Prostate cancer ([place])   . Stroke [facility])     PSH:  Past Surgical History:  Procedure Laterality Date  . APPENDECTOMY    . CHOLECYSTECTOMY    . PROSTATECTOMY      Social History:  Social History   Socioeconomic History  . Marital status: Married    Spouse name: Not on file  . Number of children: Not on file  . Years of education: Not on file  . Highest education level: Not on file  Occupational History  . Occupation:  retired    Comment: traffic cop for 22 years  Social Needs  . Financial resource strain: Not on file  . Food insecurity:    Worry: Not on file    Inability: Not on file  . Transportation needs:    Medical: Not on file    Non-medical: Not on file  Tobacco Use  . Smoking status: Never Smoker  . Smokeless tobacco: Never Used  Substance and Sexual Activity  . Alcohol use: No    Frequency: Never  . Drug use: No  . Sexual activity: Never  Lifestyle  . Physical activity:    Days per week: Not on file    Minutes per session: Not on file  . Stress: Not on file  Relationships  . Social connections:    Talks on phone: Not on file    Gets together: Not on file    Attends religious service: Not on file    Active member  of club or organization: Not on file    Attends meetings of clubs or organizations: Not on file    Relationship status: Not on file  . Intimate partner violence:    Fear of current or ex partner: Not on file    Emotionally abused: Not on file    Physically abused: Not on file    Forced sexual activity: Not on file  Other Topics Concern  . Not on file  Social History Narrative   Married for 57 years (2019). Moved to [place] in November 2018 to be closer to their daughter. Patient from [facility]. 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     Family History:  Family History  Problem Relation Age of Onset  . Colon cancer Mother   . Stroke Father   . Diabetes Brother   . Breast cancer Neg Hx   . Prostate cancer Neg Hx   . Pancreatic cancer Neg Hx     Medications:   Current Outpatient Medications on File Prior to Visit  Medication Sig Dispense Refill  . aspirin EC 81 MG tablet Take 1 tablet (81 mg total) by mouth daily. 365 tablet 0  . atorvastatin (LIPITOR) 40 MG tablet Take 1 tablet (40 mg total) by mouth daily at 6 PM. 30 tablet 2  . metFORMIN (GLUCOPHAGE) 500 MG tablet TAKE 1 TABLET BY MOUTH TWICE DAILY WITH MEAL 60 tablet 3  . traZODone (DESYREL) 50 MG  tablet TAKE 1 TABLET(50 MG) BY MOUTH AT BEDTIME AS NEEDED FOR SLEEP 30 tablet 1  . vitamin B-12 1000 MCG tablet Take 1 tablet (1,000 mcg total) by mouth daily. 30 tablet 0   No current facility-administered medications on file prior to visit.     Allergies:   Allergies  Allergen Reactions  . Morphine And Related Other (See Comments)  . Motrin [Ibuprofen] Other (See Comments)    Dry heaves  . Sulfa Antibiotics Other (See Comments)    Dry heaves     Physical Exam  Vitals:   05/24/18 1530  BP: 127/79  Pulse: 80  Weight: 203 lb (92.1 kg)  Height: 5\' 10"  (1.778 m)   Body mass index is 29.13 kg/m. No exam data present  General: well developed, well nourished, pleasant elderly caucasian male, seated, in no evident distress Head: head normocephalic and atraumatic.   Neck: supple with no carotid or supraclavicular bruits Cardiovascular: regular rate and rhythm, no murmurs Musculoskeletal: no deformity Skin:  no rash/petichiae Vascular:  Normal pulses all extremities  Neurologic Exam Mental Status: Awake and fully alert. disoriented to place and time. Remote memory intact. Attention span, concentration and fund of knowledge diminished. Mood and affect appropriate and cooperative with majority of exam.  Cranial Nerves: pupils equal, briskly reactive to light. Extraocular movements full without nystagmus. Visual fields full to confrontation. Hearing intact. Facial sensation intact. Face, tongue, palate moves normally and symmetrically.  Motor: Normal bulk and tone. Normal strength in all tested extremity muscles. Sensory.: intact to touch , pinprick , position and vibratory sensation.  Coordination: Rapid alternating movements normal in all extremities. Finger-to-nose and heel-to-shin performed accurately bilaterally. Gait and Station: Arises from chair without difficulty. Stance is normal. Gait demonstrates quick short steps with use of cane Reflexes: 1+ and symmetric. Toes  downgoing.       Diagnostic Data (Labs, Imaging, Testing)  CT HEAD WO CONTRAST 01/04/2018 IMPRESSION: No acute abnormality.  Atrophy, chronic microvascular ischemic change and remote infarcts as described above.  MR BRAIN WO  CONTRAST 01/04/2018 IMPRESSION: 1. Positive for a small acute white matter infarct in the anterior right frontal lobe. 2. The examination was discontinued prior to completion by patient request and only axial diffusion weighted imaging was obtained. 3. Scattered chronic cortical infarcts in both hemispheres with encephalomalacia.  MR MRA HEAD  MR MRA NECK 01/04/2018 IMPRESSION: MRA HEAD IMPRESSION:  1. Negative intracranial MRA. No large vessel occlusion. No hemodynamically significant or correctable stenosis. 2. Mild distal small vessel atheromatous irregularity.  MRA NECK IMPRESSION:  Negative MRA of the neck. No vascular occlusion or hemodynamically significant stenosis identified.  CT HEAD WO CONTRAST 01/29/2018 IMPRESSION: No acute findings.  Moderate chronic ischemic microvascular disease and age related atrophy. Old posterior right infarcts as described.  MR BRAIN WO CONTRAST 01/29/2018 IMPRESSION: 1. 5 mm acute/early subacute infarction within the left superior frontal centrum semiovale. No associated hemorrhage or mass effect. 2. Multiple stable chronic infarctions as above.  ECHOCARDIOGRAM 01/05/2018 Impressions: - Technically difficult study. LVEF 60-65%, modeate LVH,   incoordinate septal motion and IVS flattening (may suggest   elevated RV pressure), grade 1 DD, elevated LV filling pressure,   mild MR, normal LA size, mild TR, RVSP 40 mmHg + RAP, IVC was not   visualized, no pericardial effusion.    ASSESSMENT: Jeremiah Mccoy is a 85 y.o. year old male here with right frontal infarct and left MCA infarct on 01/04/2018 and 01/29/2018 secondary to likely small vessel disease but due to history of multiple infarcts  likely related to cardiac buttock source it was recommended to undergo 30-day cardiac monitor vascular risk factors include HTN, HLD, DM, OSA, orthostatic hypotension, BPPV, prior infarcts, dementia, and noncompliance.  He is being seen today for follow-up visit with continued deficits of dizziness and cognitive decline    PLAN:  1. Right frontal and left MCA infarct: Continue aspirin 81 mg daily  and Lipitor for secondary stroke prevention.  Maintain strict control of hypertension with blood pressure goal below 130/90, diabetes with hemoglobin A1c goal below 6.5% and cholesterol with LDL cholesterol (bad cholesterol) goal below 70 mg/dL.  I also advised the patient to eat a healthy diet with plenty of whole grains, cereals, fruits and vegetables, exercise regularly with at least 30 minutes of continuous activity daily and maintain ideal body weight. 2. Stroke deficits: Patient noncompliant with recommended therapy 3. HTN: Advised to continue current treatment regimen.  Today's BP 153/89.  Advised to continue to monitor at home along with continued follow-up with PCP for management 4. HLD: Advised to continue current treatment regimen along with continued follow-up with PCP for future prescribing and monitoring of lipid panel 5. DMII: Advised to continue to monitor glucose levels at home along with continued follow-up with PCP for management and monitoring 6. Highly encouraged medication compliance for secondary stroke prevention   As patient continues to be noncompliant with recommendations, recommended follow-up as needed but did advise wife to call office with questions, concerns or need of future follow-up appointment   Greater than 50% of time during this 25 minute visit was spent on counseling, explanation of diagnosis of right frontal infarct and left MCA infarct, reviewing risk factor management of HTN, HLD, DM, dementia, history of noncompliance, planning of further management along with  potential future management, and discussion with patient and family answering all questions.    Venancio Poisson, AGNP-BC  [facility] [address] [place], [place] [address]  Phone [phone] Fax [phone] Note: This document was prepared with digital dictation  and possible smart Company secretary. Any transcriptional errors that result from this process are unintentional.

## 2018-06-07 DIAGNOSIS — R972 Elevated prostate specific antigen [PSA]: Secondary | ICD-10-CM | POA: Diagnosis not present

## 2018-06-21 ENCOUNTER — Encounter: Payer: Self-pay | Admitting: Podiatry

## 2018-06-21 NOTE — Progress Notes (Signed)
Subjective: Patient presents today with diabetes and cc of painful, discolored, thick toenails which interfere with daily activities.  Jeremiah Maw, MD is his PCP and last dos was 05/07/2018.   Allergies  Allergen Reactions  . Morphine And Related Other (See Comments)  . Motrin [Ibuprofen] Other (See Comments)    Dry heaves  . Sulfa Antibiotics Other (See Comments)    Dry heaves     Objective:  Vascular Examination: Capillary refill time <3 seconds x 10 digits Dorsalis pedis pulses palpable 1/4 b/l Posterior tibial pulses absent b/l No digital hair x 10 digits Skin temperature warm to cool b/l  Dermatological Examination: Skin thin, shiny and atrophic b/l  Toenails 1-5 b/l discolored, thick, dystrophic with subungual debris and pain with palpation to nailbeds due to thickness of nails.  No open wounds b/l.   Musculoskeletal: Muscle strength 5/5 to all LE muscle groups  Neurological: Sensation intact with 10 gram monofilament. Vibratory sensation intact.  Assessment: 1. Painful onychomycosis toenails 1-5 b/l 2. NIDDM with Peripheral arterial disease  Plan: 1. Continue diabetic foot care principles. Literature dispensed on today. 2. Toenails 1-5 b/l were debrided in length and girth without iatrogenic bleeding. 3. Patient to continue soft, supportive shoe gear 4. Patient to report any pedal injuries to medical professional  5. Follow up 9 weeks. Patient/POA to call should there be a concern in the interim.

## 2018-07-11 ENCOUNTER — Ambulatory Visit: Payer: Medicare Other | Admitting: Podiatry

## 2018-08-06 ENCOUNTER — Encounter: Payer: Self-pay | Admitting: Family Medicine

## 2018-08-06 ENCOUNTER — Ambulatory Visit (INDEPENDENT_AMBULATORY_CARE_PROVIDER_SITE_OTHER): Payer: Medicare Other | Admitting: Family Medicine

## 2018-08-06 VITALS — Ht 70.0 in | Wt 195.5 lb

## 2018-08-06 DIAGNOSIS — G4700 Insomnia, unspecified: Secondary | ICD-10-CM

## 2018-08-06 DIAGNOSIS — E118 Type 2 diabetes mellitus with unspecified complications: Secondary | ICD-10-CM | POA: Diagnosis not present

## 2018-08-06 DIAGNOSIS — N183 Chronic kidney disease, stage 3 unspecified: Secondary | ICD-10-CM

## 2018-08-06 DIAGNOSIS — E1121 Type 2 diabetes mellitus with diabetic nephropathy: Secondary | ICD-10-CM

## 2018-08-06 DIAGNOSIS — Z794 Long term (current) use of insulin: Secondary | ICD-10-CM

## 2018-08-06 DIAGNOSIS — C61 Malignant neoplasm of prostate: Secondary | ICD-10-CM

## 2018-08-06 NOTE — Progress Notes (Signed)
Virtual Visit via Video Note  I connected with Jeremiah Mccoy on 08/06/18 at  2:00 PM EDT by a video enabled telemedicine application and verified that I am speaking with the correct person using two identifiers.  Location: Patient: home Provider:    Established Patient Office Visit  Subjective:  Patient ID: Jeremiah Mccoy, male    DOB: 1933-10-31  Age: 85 y.o. MRN: [id number]  CC:  Chief Complaint  Patient presents with  . Follow-up    HPI Jeremiah Mccoy presents for follow-up of his diabetes, prostate cancer chronic kidney disease and insomnia.  Patient continues to take metformin as directed.  His and compliance is improved.  Last hemoglobin A1c was 7.2.  Continues to receive Lupron injections.  Wife asked me to recheck the PSA.  Trazodone is been helping the insomnia.  Continues to take MiraLAX Metamucil and drink prune juice.  There is been some improvement in his constipation.  Ambulates with a cane and/or a walker.   Past Medical History:  Diagnosis Date  . Cancer ([place])   . Depression   . Diabetes mellitus without complication ([place])   . Prostate cancer ([place])   . Stroke [facility])     Past Surgical History:  Procedure Laterality Date  . APPENDECTOMY    . CHOLECYSTECTOMY    . PROSTATECTOMY      Family History  Problem Relation Age of Onset  . Colon cancer Mother   . Stroke Father   . Diabetes Brother   . Breast cancer Neg Hx   . Prostate cancer Neg Hx   . Pancreatic cancer Neg Hx     Social History   Socioeconomic History  . Marital status: Married    Spouse name: Not on file  . Number of children: Not on file  . Years of education: Not on file  . Highest education level: Not on file  Occupational History  . Occupation: retired    Comment: traffic cop for 22 years  Social Needs  . Financial resource strain: Not on file  . Food insecurity:    Worry: Not on file    Inability: Not on file  . Transportation needs:    Medical: Not on file    Non-medical:  Not on file  Tobacco Use  . Smoking status: Never Smoker  . Smokeless tobacco: Never Used  Substance and Sexual Activity  . Alcohol use: No    Frequency: Never  . Drug use: No  . Sexual activity: Never  Lifestyle  . Physical activity:    Days per week: Not on file    Minutes per session: Not on file  . Stress: Not on file  Relationships  . Social connections:    Talks on phone: Not on file    Gets together: Not on file    Attends religious service: Not on file    Active member of club or organization: Not on file    Attends meetings of clubs or organizations: Not on file    Relationship status: Not on file  . Intimate partner violence:    Fear of current or ex partner: Not on file    Emotionally abused: Not on file    Physically abused: Not on file    Forced sexual activity: Not on file  Other Topics Concern  . Not on file  Social History Narrative   Married for 57 years (2019). Moved to [place] in November 2018 to be closer to their daughter. Patient from [facility]. 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     Outpatient Medications Prior to Visit  Medication Sig Dispense Refill  . aspirin EC 81 MG tablet Take 1 tablet (81 mg total) by mouth daily. 365 tablet 0  . atorvastatin (LIPITOR) 40 MG tablet Take 1 tablet (40 mg total) by mouth daily at 6 PM. 30 tablet 2  . metFORMIN (GLUCOPHAGE) 500 MG tablet TAKE 1 TABLET BY MOUTH TWICE DAILY WITH MEAL 60 tablet 3  . traZODone (DESYREL) 50 MG tablet TAKE 1 TABLET(50 MG) BY MOUTH AT BEDTIME AS NEEDED FOR SLEEP 30 tablet 1  . vitamin B-12 1000 MCG tablet Take 1 tablet (1,000 mcg total) by mouth daily. 30 tablet 0   No facility-administered medications prior to visit.     Allergies  Allergen Reactions  . Morphine And Related Other (See Comments)  . Motrin [Ibuprofen] Other (See Comments)    Dry heaves  . Sulfa Antibiotics Other (See Comments)    Dry heaves    ROS Review of Systems  Constitutional: Negative.    Respiratory: Negative.   Cardiovascular: Negative.   Gastrointestinal: Positive for constipation. Negative for abdominal pain, anal bleeding and blood in stool.  Endocrine: Negative for polyphagia and polyuria.  Musculoskeletal: Positive for gait problem.  Allergic/Immunologic: Negative for immunocompromised state.  Neurological: Negative for headaches.  Psychiatric/Behavioral: Negative.       Objective:    Physical Exam  Constitutional: He is oriented to person, place, and time. He appears well-developed and well-nourished. No distress.  HENT:  Head: Normocephalic and atraumatic.  Right Ear: External ear normal.  Left Ear: External ear normal.  Eyes: Right eye exhibits no discharge. Left eye exhibits no discharge. No scleral icterus.  Neurological: He is alert and oriented to person, place, and time.  Skin: He is not diaphoretic.  Psychiatric: He has a normal mood and affect. His behavior is normal.    Ht 5\' 10"  (1.778 m)   Wt 195 lb 8 oz (88.7 kg)   BMI 28.05 kg/m  Wt Readings from Last 3 Encounters:  08/06/18 195 lb 8 oz (88.7 kg)  05/24/18 203 lb (92.1 kg)  05/07/18 207 lb 8 oz (94.1 kg)     Health Maintenance Due  Topic Date Due  . FOOT EXAM  05/26/1945  . OPHTHALMOLOGY EXAM  05/26/1945  . TETANUS/TDAP  05/27/1954  . URINE MICROALBUMIN  03/16/2018    There are no preventive care reminders to display for this patient.  Lab Results  Component Value Date   TSH 4.03 05/07/2018   Lab Results  Component Value Date   WBC 9.4 05/07/2018   HGB 13.5 05/07/2018   HCT 40.7 05/07/2018   MCV 102.0 (H) 05/07/2018   PLT 171.0 05/07/2018   Lab Results  Component Value Date   NA 145 05/07/2018   K 5.2 (H) 05/07/2018   CO2 25 05/07/2018   GLUCOSE 150 (H) 05/07/2018   BUN 23 05/07/2018   CREATININE 1.26 05/07/2018   BILITOT 0.4 05/07/2018   ALKPHOS 70 05/07/2018   AST 23 05/07/2018   ALT 39 05/07/2018   PROT 6.5 05/07/2018   ALBUMIN 4.0 05/07/2018   CALCIUM  9.4 05/07/2018   ANIONGAP 6 01/29/2018   GFR 54.66 (L) 05/07/2018   Lab Results  Component Value Date   CHOL 178 01/05/2018   Lab Results  Component Value Date   HDL 38 (L) 01/05/2018   Lab Results  Component Value Date   LDLCALC 112 (H) 01/05/2018   Lab  Results  Component Value Date   TRIG 142 01/05/2018   Lab Results  Component Value Date   CHOLHDL 4.7 01/05/2018   Lab Results  Component Value Date   HGBA1C 7.1 (H) 05/07/2018      Assessment & Plan:   Problem List Items Addressed This Visit      Endocrine   Type 2 diabetes mellitus with complication, with long-term current use of insulin ([place]) - Primary   Relevant Orders   Basic metabolic panel   Hemoglobin A1c   Type II diabetes mellitus with renal manifestations ([facility])   Relevant Orders   Basic metabolic panel   Hemoglobin A1c     Genitourinary   Recurrent prostate cancer ([place])   Relevant Orders   PSA   CKD (chronic kidney disease), stage III ([place])   Relevant Orders   Basic metabolic panel     Other   Insomnia      No orders of the defined types were placed in this encounter.   Follow-up: Return in about 3 months (around 11/06/2018), or if symptoms worsen or fail to improve.    Libby Maw, MD   I discussed the limitations of evaluation and management by telemedicine and the availability of in person appointments. The patient expressed understanding and agreed to proceed.  History of Present Illness:    Observations/Objective:   Assessment and Plan:   Follow Up Instructions:    I discussed the assessment and treatment plan with the patient. The patient was provided an opportunity to ask questions and all were answered. The patient agreed with the plan and demonstrated an understanding of the instructions.   The patient was advised to call back or seek an in-person evaluation if the symptoms worsen or if the condition fails to improve as anticipated.  I provided 15 minutes  of non-face-to-face time during this encounter.   Patient will continue all medicines as above.  Encouraged ambulation and activity as tolerated.  Follow-up in a few weeks to have above ordered labs drawn.

## 2018-08-09 ENCOUNTER — Other Ambulatory Visit (INDEPENDENT_AMBULATORY_CARE_PROVIDER_SITE_OTHER): Payer: Medicare Other

## 2018-08-09 DIAGNOSIS — E118 Type 2 diabetes mellitus with unspecified complications: Secondary | ICD-10-CM | POA: Diagnosis not present

## 2018-08-09 DIAGNOSIS — E1121 Type 2 diabetes mellitus with diabetic nephropathy: Secondary | ICD-10-CM | POA: Diagnosis not present

## 2018-08-09 DIAGNOSIS — C61 Malignant neoplasm of prostate: Secondary | ICD-10-CM

## 2018-08-09 DIAGNOSIS — Z794 Long term (current) use of insulin: Secondary | ICD-10-CM | POA: Diagnosis not present

## 2018-08-09 DIAGNOSIS — N183 Chronic kidney disease, stage 3 unspecified: Secondary | ICD-10-CM

## 2018-08-09 LAB — BASIC METABOLIC PANEL WITH GFR
BUN: 21 mg/dL (ref 6–23)
CO2: 22 meq/L (ref 19–32)
Calcium: 9 mg/dL (ref 8.4–10.5)
Chloride: 106 meq/L (ref 96–112)
Creatinine, Ser: 1.24 mg/dL (ref 0.40–1.50)
GFR: 55.65 mL/min — ABNORMAL LOW
Glucose, Bld: 136 mg/dL — ABNORMAL HIGH (ref 70–99)
Potassium: 4.6 meq/L (ref 3.5–5.1)
Sodium: 140 meq/L (ref 135–145)

## 2018-08-09 LAB — PSA: PSA: 0 ng/mL — ABNORMAL LOW (ref 0.10–4.00)

## 2018-08-09 LAB — HEMOGLOBIN A1C: Hgb A1c MFr Bld: 7.5 % — ABNORMAL HIGH (ref 4.6–6.5)

## 2018-08-10 NOTE — Progress Notes (Signed)
Charted in result notes. 

## 2018-08-17 DIAGNOSIS — N393 Stress incontinence (female) (male): Secondary | ICD-10-CM | POA: Diagnosis not present

## 2018-08-17 DIAGNOSIS — C61 Malignant neoplasm of prostate: Secondary | ICD-10-CM | POA: Diagnosis not present

## 2018-08-17 DIAGNOSIS — R9721 Rising PSA following treatment for malignant neoplasm of prostate: Secondary | ICD-10-CM | POA: Diagnosis not present

## 2018-08-21 ENCOUNTER — Other Ambulatory Visit: Payer: Self-pay | Admitting: Family Medicine

## 2018-08-22 ENCOUNTER — Ambulatory Visit: Payer: Medicare Other | Admitting: Podiatry

## 2018-09-24 ENCOUNTER — Other Ambulatory Visit: Payer: Self-pay | Admitting: Family Medicine

## 2018-09-24 DIAGNOSIS — E785 Hyperlipidemia, unspecified: Secondary | ICD-10-CM

## 2018-09-24 DIAGNOSIS — Z8673 Personal history of transient ischemic attack (TIA), and cerebral infarction without residual deficits: Secondary | ICD-10-CM

## 2018-09-25 ENCOUNTER — Ambulatory Visit (INDEPENDENT_AMBULATORY_CARE_PROVIDER_SITE_OTHER): Payer: Medicare Other | Admitting: Podiatry

## 2018-09-25 ENCOUNTER — Other Ambulatory Visit: Payer: Self-pay

## 2018-09-25 ENCOUNTER — Encounter: Payer: Self-pay | Admitting: Podiatry

## 2018-09-25 VITALS — Temp 97.8°F

## 2018-09-25 DIAGNOSIS — M79674 Pain in right toe(s): Secondary | ICD-10-CM | POA: Diagnosis not present

## 2018-09-25 DIAGNOSIS — B351 Tinea unguium: Secondary | ICD-10-CM

## 2018-09-25 DIAGNOSIS — M79675 Pain in left toe(s): Secondary | ICD-10-CM

## 2018-09-25 NOTE — Patient Instructions (Signed)
Diabetes Mellitus and Foot Care Foot care is an important part of your health, especially when you have diabetes. Diabetes may cause you to have problems because of poor blood flow (circulation) to your feet and legs, which can cause your skin to:  Become thinner and drier.  Break more easily.  Heal more slowly.  Peel and crack. You may also have nerve damage (neuropathy) in your legs and feet, causing decreased feeling in them. This means that you may not notice minor injuries to your feet that could lead to more serious problems. Noticing and addressing any potential problems early is the best way to prevent future foot problems. How to care for your feet Foot hygiene  Wash your feet daily with warm water and mild soap. Do not use hot water. Then, pat your feet and the areas between your toes until they are completely dry. Do not soak your feet as this can dry your skin.  Trim your toenails straight across. Do not dig under them or around the cuticle. File the edges of your nails with an emery board or nail file.  Apply a moisturizing lotion or petroleum jelly to the skin on your feet and to dry, brittle toenails. Use lotion that does not contain alcohol and is unscented. Do not apply lotion between your toes. Shoes and socks  Wear clean socks or stockings every day. Make sure they are not too tight. Do not wear knee-high stockings since they may decrease blood flow to your legs.  Wear shoes that fit properly and have enough cushioning. Always look in your shoes before you put them on to be sure there are no objects inside.  To break in new shoes, wear them for just a few hours a day. This prevents injuries on your feet. Wounds, scrapes, corns, and calluses  Check your feet daily for blisters, cuts, bruises, sores, and redness. If you cannot see the bottom of your feet, use a mirror or ask someone for help.  Do not cut corns or calluses or try to remove them with medicine.  If you  find a minor scrape, cut, or break in the skin on your feet, keep it and the skin around it clean and dry. You may clean these areas with mild soap and water. Do not clean the area with peroxide, alcohol, or iodine.  If you have a wound, scrape, corn, or callus on your foot, look at it several times a day to make sure it is healing and not infected. Check for: ? Redness, swelling, or pain. ? Fluid or blood. ? Warmth. ? Pus or a bad smell. General instructions  Do not cross your legs. This may decrease blood flow to your feet.  Do not use heating pads or hot water bottles on your feet. They may burn your skin. If you have lost feeling in your feet or legs, you may not know this is happening until it is too late.  Protect your feet from hot and cold by wearing shoes, such as at the beach or on hot pavement.  Schedule a complete foot exam at least once a year (annually) or more often if you have foot problems. If you have foot problems, report any cuts, sores, or bruises to your health care provider immediately. Contact a health care provider if:  You have a medical condition that increases your risk of infection and you have any cuts, sores, or bruises on your feet.  You have an injury that is not   healing.  You have redness on your legs or feet.  You feel burning or tingling in your legs or feet.  You have pain or cramps in your legs and feet.  Your legs or feet are numb.  Your feet always feel cold.  You have pain around a toenail. Get help right away if:  You have a wound, scrape, corn, or callus on your foot and: ? You have pain, swelling, or redness that gets worse. ? You have fluid or blood coming from the wound, scrape, corn, or callus. ? Your wound, scrape, corn, or callus feels warm to the touch. ? You have pus or a bad smell coming from the wound, scrape, corn, or callus. ? You have a fever. ? You have a red line going up your leg. Summary  Check your feet every day  for cuts, sores, red spots, swelling, and blisters.  Moisturize feet and legs daily.  Wear shoes that fit properly and have enough cushioning.  If you have foot problems, report any cuts, sores, or bruises to your health care provider immediately.  Schedule a complete foot exam at least once a year (annually) or more often if you have foot problems. This information is not intended to replace advice given to you by your health care provider. Make sure you discuss any questions you have with your health care provider. Document Released: 03/11/2000 Document Revised: 04/26/2017 Document Reviewed: 04/15/2016 Elsevier Patient Education  2020 Elsevier Inc.  

## 2018-09-29 NOTE — Progress Notes (Signed)
Subjective:  Jeremiah Mccoy presents to clinic today with cc of  painful, thick, discolored, elongated toenails 1-5 b/l that become tender and cannot cut because of thickness. Pain is aggravated when wearing enclosed shoe gear.  Libby Maw, MD is his PCP.    Current Outpatient Medications:  .  aspirin EC 81 MG tablet, Take 1 tablet (81 mg total) by mouth daily., Disp: 365 tablet, Rfl: 0 .  atorvastatin (LIPITOR) 40 MG tablet, TAKE 1 TABLET(40 MG) BY MOUTH DAILY AT 6 PM, Disp: 90 tablet, Rfl: 1 .  metFORMIN (GLUCOPHAGE) 500 MG tablet, TAKE 1 TABLET BY MOUTH TWICE DAILY WITH MEAL, Disp: 60 tablet, Rfl: 3 .  traZODone (DESYREL) 50 MG tablet, TAKE 1 TABLET(50 MG) BY MOUTH AT BEDTIME AS NEEDED FOR SLEEP, Disp: 30 tablet, Rfl: 1 .  vitamin B-12 1000 MCG tablet, Take 1 tablet (1,000 mcg total) by mouth daily., Disp: 30 tablet, Rfl: 0   Allergies  Allergen Reactions  . Morphine And Related Other (See Comments)  . Motrin [Ibuprofen] Other (See Comments)    Dry heaves  . Sulfa Antibiotics Other (See Comments)    Dry heaves     Objective: Vitals:   09/25/18 1624  Temp: 97.8 F (36.6 C)    Physical Examination:  Vascular Examination: Capillary refill time <3 seconds x 10 digits.  Palpable DP 1/4 b/l  PT pulses 0/4 b/l.  Digital hair absent b/l.   No edema noted b/l.  Skin temperature gradient warm to cool b/l.  Dermatological Examination: Pedal skin thin, shiny and atrophic b/l.  No open wounds b/l.  No interdigital macerations noted b/l.  Elongated, thick, discolored brittle toenails with subungual debris and pain on dorsal palpation of nailbeds 1-5 b/l.  Musculoskeletal Examination: Muscle strength 5/5 to all muscle groups b/l  No pain, crepitus or joint discomfort with active/passive ROM.  Neurological Examination: Sensation intact 5/5 b/l with 10 gram monofilament.  Vibratory sensation intact b/l.  Assessment: Mycotic nail infection with pain 1-5  b/l NIDDM with PAD  Plan: 1. Toenails 1-5 b/l were debrided in length and girth without iatrogenic laceration. 2.  Continue soft, supportive shoe gear daily. 3.  Report any pedal injuries to medical professional. 4.  Follow up 3 months. 5.  Patient/POA to call should there be a question/concern in there interim.

## 2018-10-15 ENCOUNTER — Other Ambulatory Visit: Payer: Self-pay

## 2018-10-15 ENCOUNTER — Telehealth: Payer: Self-pay | Admitting: Family Medicine

## 2018-10-15 ENCOUNTER — Encounter (HOSPITAL_COMMUNITY): Payer: Self-pay

## 2018-10-15 ENCOUNTER — Inpatient Hospital Stay (HOSPITAL_COMMUNITY)
Admission: EM | Admit: 2018-10-15 | Discharge: 2018-10-17 | DRG: 312 | Disposition: A | Discharge status: Home / Self Care | Payer: Medicare Other | Attending: Internal Medicine | Admitting: Internal Medicine

## 2018-10-15 ENCOUNTER — Emergency Department (HOSPITAL_COMMUNITY): Payer: Medicare Other

## 2018-10-15 DIAGNOSIS — Z7982 Long term (current) use of aspirin: Secondary | ICD-10-CM

## 2018-10-15 DIAGNOSIS — Z03818 Encounter for observation for suspected exposure to other biological agents ruled out: Secondary | ICD-10-CM | POA: Diagnosis not present

## 2018-10-15 DIAGNOSIS — I129 Hypertensive chronic kidney disease with stage 1 through stage 4 chronic kidney disease, or unspecified chronic kidney disease: Secondary | ICD-10-CM | POA: Diagnosis present

## 2018-10-15 DIAGNOSIS — R55 Syncope and collapse: Principal | ICD-10-CM | POA: Diagnosis present

## 2018-10-15 DIAGNOSIS — R296 Repeated falls: Secondary | ICD-10-CM | POA: Diagnosis present

## 2018-10-15 DIAGNOSIS — F0150 Vascular dementia without behavioral disturbance: Secondary | ICD-10-CM | POA: Diagnosis present

## 2018-10-15 DIAGNOSIS — Z823 Family history of stroke: Secondary | ICD-10-CM

## 2018-10-15 DIAGNOSIS — E1122 Type 2 diabetes mellitus with diabetic chronic kidney disease: Secondary | ICD-10-CM | POA: Diagnosis present

## 2018-10-15 DIAGNOSIS — Z7984 Long term (current) use of oral hypoglycemic drugs: Secondary | ICD-10-CM

## 2018-10-15 DIAGNOSIS — S199XXA Unspecified injury of neck, initial encounter: Secondary | ICD-10-CM | POA: Diagnosis not present

## 2018-10-15 DIAGNOSIS — N183 Chronic kidney disease, stage 3 unspecified: Secondary | ICD-10-CM | POA: Diagnosis present

## 2018-10-15 DIAGNOSIS — Z1159 Encounter for screening for other viral diseases: Secondary | ICD-10-CM | POA: Diagnosis not present

## 2018-10-15 DIAGNOSIS — Z8546 Personal history of malignant neoplasm of prostate: Secondary | ICD-10-CM

## 2018-10-15 DIAGNOSIS — Z8673 Personal history of transient ischemic attack (TIA), and cerebral infarction without residual deficits: Secondary | ICD-10-CM

## 2018-10-15 DIAGNOSIS — E118 Type 2 diabetes mellitus with unspecified complications: Secondary | ICD-10-CM

## 2018-10-15 DIAGNOSIS — R51 Headache: Secondary | ICD-10-CM | POA: Diagnosis not present

## 2018-10-15 DIAGNOSIS — R42 Dizziness and giddiness: Secondary | ICD-10-CM | POA: Diagnosis not present

## 2018-10-15 DIAGNOSIS — I440 Atrioventricular block, first degree: Secondary | ICD-10-CM | POA: Diagnosis not present

## 2018-10-15 DIAGNOSIS — S0990XA Unspecified injury of head, initial encounter: Secondary | ICD-10-CM | POA: Diagnosis not present

## 2018-10-15 DIAGNOSIS — R000 Tachycardia, unspecified: Secondary | ICD-10-CM | POA: Diagnosis not present

## 2018-10-15 DIAGNOSIS — Z9181 History of falling: Secondary | ICD-10-CM

## 2018-10-15 DIAGNOSIS — Z833 Family history of diabetes mellitus: Secondary | ICD-10-CM

## 2018-10-15 DIAGNOSIS — R52 Pain, unspecified: Secondary | ICD-10-CM | POA: Diagnosis not present

## 2018-10-15 DIAGNOSIS — E785 Hyperlipidemia, unspecified: Secondary | ICD-10-CM | POA: Diagnosis present

## 2018-10-15 LAB — BASIC METABOLIC PANEL
Anion gap: 10 (ref 5–15)
BUN: 22 mg/dL (ref 8–23)
CO2: 23 mmol/L (ref 22–32)
Calcium: 9.1 mg/dL (ref 8.9–10.3)
Chloride: 107 mmol/L (ref 98–111)
Creatinine, Ser: 1.35 mg/dL — ABNORMAL HIGH (ref 0.61–1.24)
GFR calc Af Amer: 56 mL/min — ABNORMAL LOW (ref >60)
GFR calc non Af Amer: 48 mL/min — ABNORMAL LOW (ref >60)
Glucose, Bld: 146 mg/dL — ABNORMAL HIGH (ref 70–99)
Potassium: 4.2 mmol/L (ref 3.5–5.1)
Sodium: 140 mmol/L (ref 135–145)

## 2018-10-15 LAB — CBC
HCT: 35.2 % — ABNORMAL LOW (ref 39.0–52.0)
Hemoglobin: 11.7 g/dL — ABNORMAL LOW (ref 13.0–17.0)
MCH: 33.9 pg (ref 26.0–34.0)
MCHC: 33.2 g/dL (ref 30.0–36.0)
MCV: 102 fL — ABNORMAL HIGH (ref 80.0–100.0)
Platelets: 152 10*3/uL (ref 150–400)
RBC: 3.45 MIL/uL — ABNORMAL LOW (ref 4.22–5.81)
RDW: 13.2 % (ref 11.5–15.5)
WBC: 9 10*3/uL (ref 4.0–10.5)
nRBC: 0 % (ref 0.0–0.2)

## 2018-10-15 LAB — URINALYSIS, ROUTINE W REFLEX MICROSCOPIC
Bilirubin Urine: NEGATIVE
Glucose, UA: NEGATIVE mg/dL
Hgb urine dipstick: NEGATIVE
Ketones, ur: NEGATIVE mg/dL
Leukocytes,Ua: NEGATIVE
Nitrite: NEGATIVE
Protein, ur: NEGATIVE mg/dL
Specific Gravity, Urine: 1.019 (ref 1.005–1.030)
pH: 7 (ref 5.0–8.0)

## 2018-10-15 LAB — CBG MONITORING, ED: Glucose-Capillary: 129 mg/dL — ABNORMAL HIGH (ref 70–99)

## 2018-10-15 MED ORDER — SODIUM CHLORIDE 0.9% FLUSH
3.0000 mL | Freq: Once | INTRAVENOUS | Status: AC
  Administered 2018-10-15: 3 mL via INTRAVENOUS

## 2018-10-15 NOTE — ED Triage Notes (Signed)
Pt from home with ems for near syncope and fall. Pt felt dizzy prior to fall, hit his head on the wall, pt denies taking any blood thinners. Hr ranging from 40-120 a fib, no hx of a fib. Hx of stroke in November. 5mg  metoprolol given en route. Pt arrives to ED alert and oriented

## 2018-10-15 NOTE — ED Notes (Signed)
Pt. states he is unable to urinate. Urinal placed by bedside, refused condom cath.

## 2018-10-15 NOTE — Telephone Encounter (Signed)
Please advise 

## 2018-10-15 NOTE — Telephone Encounter (Signed)
Patient's wife is calling to ask does the patient need to come in for lab work prior to the 11/05/2018 appt please advise thank you [phone]

## 2018-10-15 NOTE — Telephone Encounter (Signed)
Will recheck it at the office visit.

## 2018-10-15 NOTE — ED Notes (Signed)
Patient transported to CT 

## 2018-10-15 NOTE — ED Provider Notes (Signed)
[facility] EMERGENCY DEPARTMENT Provider Note   CSN: [id number] Arrival date & time: 10/15/18  2033     History   Chief Complaint Chief Complaint  Patient presents with   Near Syncope   Fall   Dizziness    HPI Jeremiah Mccoy is a 85 y.o. male.     HPI  Patient is an 85 year old male with a past medical history of diabetes, CVA, depression, prostate cancer s/p prostatectomy, CKD, prior CVA who presents to the ED today via EMS from home after an unwitnessed syncopal/near syncopal episode at home.  According to EMS, the information they received from the patient's pulses that the patient felt dizzy prior to the to falling.  They were advised that the patient has had on the wall, though the patient does not recall this.  Patient is not on any blood thinners.  No external signs of trauma.  EMS reports patient's heart rate variable between 40 and 120, they suspected that she was in atrial fibrillation and apparently gave him 5 mg of metoprolol.  Jeremiah Mccoy initially denies to me that he fell, he states that he was sitting on his couch watching TV just fell asleep.  Later, on reassessment he states that he may have actually passed out and he has passed out several times over the past few months.  Attempted to contact the patient's wife for further collateral information but was unsuccessful.  Past Medical History:  Diagnosis Date   Cancer ([place])    Depression    Diabetes mellitus without complication ([place])    Prostate cancer ([place])    Stroke [facility])     Patient Active Problem List   Diagnosis Date Noted   Elevated LDL cholesterol level 05/07/2018   Elevated TSH 05/07/2018   Hypothyroidism 05/07/2018   Slow transit constipation 05/07/2018   Insomnia 02/15/2018   Bronchitis 02/07/2018   Type II diabetes mellitus with renal manifestations ([place]) 01/29/2018   HLD (hyperlipidemia) 01/29/2018   Prostate cancer ([place]) 01/29/2018   CKD (chronic kidney  disease), stage III ([place]) 01/29/2018   Dizziness 01/29/2018   Hospital discharge follow-up 01/11/2018   Thrombocytopenia ([place]) 01/05/2018   AKI (acute kidney injury) ([place]) 01/05/2018   Lightheadedness 01/04/2018   Stroke ([place]) 01/04/2018   Recurrent prostate cancer ([place]) 10/24/2017   Traumatic ecchymosis of abdominal wall 10/19/2017   Macrocytosis without anemia 07/17/2017   Macrocytosis 07/17/2017   GERD (gastroesophageal reflux disease) 06/28/2017   Chest pain 06/22/2017   Elevated PSA, less than 10 ng/ml 04/17/2017   Type 2 diabetes mellitus with complication, with long-term current use of insulin ([place]) 03/15/2017   Elevated cholesterol 03/15/2017   History of CVA (cerebrovascular accident) 03/15/2017   Non-compliance 03/15/2017   History of prostate cancer 03/15/2017   Continuous leakage of urine 03/15/2017   Erectile dysfunction following prostate ablative therapy 03/15/2017    Past Surgical History:  Procedure Laterality Date   APPENDECTOMY     CHOLECYSTECTOMY     PROSTATECTOMY          Home Medications    Prior to Admission medications   Medication Sig Start Date End Date Taking? Authorizing Provider  aspirin EC 81 MG tablet Take 1 tablet (81 mg total) by mouth daily. 05/07/18   Libby Maw, MD  atorvastatin (LIPITOR) 40 MG tablet TAKE 1 TABLET(40 MG) BY MOUTH DAILY AT 6 PM Patient taking differently: Take 40 mg by mouth daily at 6 PM.  09/24/18   Libby Maw, MD  metFORMIN (GLUCOPHAGE) 500 MG tablet TAKE 1 TABLET BY MOUTH TWICE DAILY WITH MEAL 05/08/18   Libby Maw, MD  traZODone (DESYREL) 50 MG tablet TAKE 1 TABLET(50 MG) BY MOUTH AT BEDTIME AS NEEDED FOR SLEEP 08/21/18   Libby Maw, MD  vitamin B-12 1000 MCG tablet Take 1 tablet (1,000 mcg total) by mouth daily. 01/06/18   Geradine Girt, DO    Family History Family History  Problem Relation Age of Onset   Colon cancer Mother    Stroke  Father    Diabetes Brother    Breast cancer Neg Hx    Prostate cancer Neg Hx    Pancreatic cancer Neg Hx     Social History Social History   Tobacco Use   Smoking status: Never Smoker   Smokeless tobacco: Never Used  Substance Use Topics   Alcohol use: No    Frequency: Never   Drug use: No     Allergies   Morphine and related, Motrin [ibuprofen], and Sulfa antibiotics   Review of Systems Review of Systems  Constitutional: Negative for chills and fever.  HENT: Negative for ear pain and sore throat.   Eyes: Negative for pain and visual disturbance.  Respiratory: Negative for cough and shortness of breath.   Cardiovascular: Negative for chest pain and palpitations.  Gastrointestinal: Negative for abdominal pain and vomiting.  Genitourinary: Negative for dysuria and hematuria.  Musculoskeletal: Negative for arthralgias and back pain.  Skin: Negative for color change and rash.  Neurological: Negative for seizures and syncope.  Psychiatric/Behavioral: Positive for confusion.  All other systems reviewed and are negative.    Physical Exam ED Triage Vitals [10/15/18 2050]  Enc Vitals Group     BP 140/89     Pulse Rate 66     Resp 16     Temp 98.1 F (36.7 C)     Temp Source Oral     SpO2 94 %    Updated Vital Signs BP 124/63    Pulse 67    Temp 98.1 F (36.7 C) (Oral)    Resp 11    SpO2 97%   Physical Exam Vitals signs and nursing note reviewed.  Constitutional:      Appearance: Normal appearance. He is well-developed.  HENT:     Head: Normocephalic and atraumatic.     Comments: No external signs of trauma to the head, scalp, skull, face, ears, nose, mouth.  Cervical collar is in place    Nose: Nose normal.  Eyes:     Conjunctiva/sclera: Conjunctivae normal.     Pupils: Pupils are equal, round, and reactive to light.  Neck:     Musculoskeletal: Neck supple.  Cardiovascular:     Rate and Rhythm: Normal rate and regular rhythm.     Heart sounds: No  murmur.  Pulmonary:     Effort: Pulmonary effort is normal. No respiratory distress.     Breath sounds: Normal breath sounds.  Abdominal:     Palpations: Abdomen is soft.     Tenderness: There is no abdominal tenderness.  Skin:    General: Skin is warm and dry.  Neurological:     General: No focal deficit present.     Mental Status: He is alert. He is confused.     Comments: Cranial Nerves:  II: Intact to confrontation.  III, IV, VI:  Pupils equal, round and reactive to light . Full eye movements without nystagmus  V, VII: Facial sensation intact bilaterally. No weakness  of masticatory muscles and smile is symmetric VIII Auditory Acuity: Grossly normal  IX/X: The uvula is midline; the palate elevates symmetrically  XI: shoulder shrug symmetric  XII: The tongue protrudes midline.    Motor System: Muscle Strength: 5/5 and symmetric in the upper and lower extremities. No pronation or drift of BUE. Muscle Tone: Tone and muscle bulk are symmetric in the upper and lower extremities.   Reflexes: did not assess  Coordination: did not assess Sensation: Intact to light touch Gait: ambulates independently without difficulty or notable abnormality       ED Treatments / Results  Labs (all labs ordered are listed, but only abnormal results are displayed) Labs Reviewed  BASIC METABOLIC PANEL - Abnormal; Notable for the following components:      Result Value   Glucose, Bld 146 (*)    Creatinine, Ser 1.35 (*)    GFR calc non Af Amer 48 (*)    GFR calc Af Amer 56 (*)    All other components within normal limits  CBC - Abnormal; Notable for the following components:   RBC 3.45 (*)    Hemoglobin 11.7 (*)    HCT 35.2 (*)    MCV 102.0 (*)    All other components within normal limits  CBG MONITORING, ED - Abnormal; Notable for the following components:   Glucose-Capillary 129 (*)    All other components within normal limits  URINALYSIS, ROUTINE W REFLEX MICROSCOPIC  CBG MONITORING, ED      EKG None  Radiology Ct Head Wo Contrast  Result Date: 10/15/2018 CLINICAL DATA:  Head trauma, minor, GCS>=13, high clinical risk, initial exam; Head trauma, minor, GCS>=13, low clinical risk, initial exam Near syncope and fall at home. Dizziness. EXAM: CT HEAD WITHOUT CONTRAST CT CERVICAL SPINE WITHOUT CONTRAST TECHNIQUE: Multidetector CT imaging of the head and cervical spine was performed following the standard protocol without intravenous contrast. Multiplanar CT image reconstructions of the cervical spine were also generated. COMPARISON:  Head CT and brain MRI 01/29/2018 FINDINGS: CT HEAD FINDINGS Brain: No evidence of acute infarction, hemorrhage, hydrocephalus, extra-axial collection or mass lesion/mass effect. Generalized atrophy. Moderate chronic small vessel ischemia. Remote infarcts in the right parietal and occipital lobes, small remote left occipital infarct. Tiny remote right cerebellar infarct. Vascular: Insert vest filler Skull: No fracture or focal lesion. Sinuses/Orbits: Minor mucosal thickening of ethmoid air cells. No sinus fluid levels. Mastoid air cells are clear. Included orbits are unremarkable. Other: None. CT CERVICAL SPINE FINDINGS Alignment: Normal. Skull base and vertebrae: No acute fracture. Vertebral body heights are maintained. The dens and skull base are intact. Soft tissues and spinal canal: No prevertebral fluid or swelling. No visible canal hematoma. Disc levels: Disc space narrowing and endplate spurring are most prominent C5-C6 and C6-C7. Multilevel facet hypertrophy. Upper chest: Carotid calcifications. Other: None. IMPRESSION: 1. No acute intracranial abnormality. No skull fracture. 2. Atrophy and chronic small vessel ischemia. Multiple remote infarcts. 3. Degenerative change in the cervical spine without acute fracture or subluxation. Electronically Signed   By: Keith Rake M.D.   On: 10/15/2018 23:17   Ct Cervical Spine Wo Contrast  Result Date:  10/15/2018 CLINICAL DATA:  Head trauma, minor, GCS>=13, high clinical risk, initial exam; Head trauma, minor, GCS>=13, low clinical risk, initial exam Near syncope and fall at home. Dizziness. EXAM: CT HEAD WITHOUT CONTRAST CT CERVICAL SPINE WITHOUT CONTRAST TECHNIQUE: Multidetector CT imaging of the head and cervical spine was performed following the standard protocol without intravenous contrast. Multiplanar  CT image reconstructions of the cervical spine were also generated. COMPARISON:  Head CT and brain MRI 01/29/2018 FINDINGS: CT HEAD FINDINGS Brain: No evidence of acute infarction, hemorrhage, hydrocephalus, extra-axial collection or mass lesion/mass effect. Generalized atrophy. Moderate chronic small vessel ischemia. Remote infarcts in the right parietal and occipital lobes, small remote left occipital infarct. Tiny remote right cerebellar infarct. Vascular: Insert vest filler Skull: No fracture or focal lesion. Sinuses/Orbits: Minor mucosal thickening of ethmoid air cells. No sinus fluid levels. Mastoid air cells are clear. Included orbits are unremarkable. Other: None. CT CERVICAL SPINE FINDINGS Alignment: Normal. Skull base and vertebrae: No acute fracture. Vertebral body heights are maintained. The dens and skull base are intact. Soft tissues and spinal canal: No prevertebral fluid or swelling. No visible canal hematoma. Disc levels: Disc space narrowing and endplate spurring are most prominent C5-C6 and C6-C7. Multilevel facet hypertrophy. Upper chest: Carotid calcifications. Other: None. IMPRESSION: 1. No acute intracranial abnormality. No skull fracture. 2. Atrophy and chronic small vessel ischemia. Multiple remote infarcts. 3. Degenerative change in the cervical spine without acute fracture or subluxation. Electronically Signed   By: Keith Rake M.D.   On: 10/15/2018 23:17    Procedures Procedures (including critical care time)  Medications Ordered in ED Medications  sodium chloride flush  (NS) 0.9 % injection 3 mL (3 mLs Intravenous Given 10/15/18 2200)     Initial Impression / Assessment and Plan / ED Course  I have reviewed the triage vital signs and the nursing notes.  Pertinent labs & imaging results that were available during my care of the patient were reviewed by me and considered in my medical decision making (see chart for details).  Of note, this patient was evaluated in the Emergency Department for the symptoms described in the history of present illness. He was evaluated in the context of the global COVID-19 pandemic, which necessitated consideration that the patient might be at risk for infection with the SARS-CoV-2 virus that causes COVID-19. Institutional protocols and algorithms that pertain to the evaluation of patients at risk for COVID-19 are in a state of rapid change based on information released by regulatory bodies including the CDC and federal and state organizations. These policies and algorithms were followed during the patient's care in the ED.  During this patient encounter, the patient was wearing a mask, and throughout this encounter I was wearing at least a surgical mask.  I was not within 6 feet of this patient for more than 15 minutes without eye protection when they were not wearing mask.   Differentials considered: Syncope and collapse, near syncope, vasovagal, dehydration, ACS, head injury  EM Physician interpretation of Labs & Imaging:    Medical Decision Making:  Jeremiah Mccoy is a 85 y.o. male with past medical history significant for diabetes, prior CVA, prostate cancer s/p prostatectomy, suspected dementia who presents to the emergency department today via EMS for evaluation after an unwitnessed episode thought to be syncope or near syncope with collapse at home.  Patient lives with his wife and according to EMS they received information at the past if patient had felt dizzy prior to "falling" and that he may have hit his head on some  drywall.  The patient does not recall this he is not currently on blood thinners.  Apparently during transport the patient's HR was quite variable, between 40s and 140s  EMS thought that his heart rate was potentially A. Fib and gave him 5mg  of IV metoprolol  The plan for  this patient was discussed with my attending physician, Dr. Blanchie Dessert, who voiced agreement and who oversaw evaluation and treatment of this patient.   CLINICAL IMPRESSION: 1. Syncope and collapse   2. Syncope      Disposition: Admit   Charmain Diosdado A. Jimmye Norman, MD Resident Physician, PGY-3 Emergency Medicine [facility]    Jefm Petty, MD 10/16/18 1256    Ezequiel Essex, MD 10/18/18 1015

## 2018-10-16 ENCOUNTER — Observation Stay (HOSPITAL_COMMUNITY): Payer: Medicare Other

## 2018-10-16 ENCOUNTER — Telehealth: Payer: Self-pay

## 2018-10-16 ENCOUNTER — Observation Stay (HOSPITAL_BASED_OUTPATIENT_CLINIC_OR_DEPARTMENT_OTHER): Payer: Medicare Other

## 2018-10-16 ENCOUNTER — Other Ambulatory Visit: Payer: Self-pay | Admitting: Medical

## 2018-10-16 DIAGNOSIS — I499 Cardiac arrhythmia, unspecified: Secondary | ICD-10-CM | POA: Diagnosis not present

## 2018-10-16 DIAGNOSIS — I129 Hypertensive chronic kidney disease with stage 1 through stage 4 chronic kidney disease, or unspecified chronic kidney disease: Secondary | ICD-10-CM | POA: Diagnosis not present

## 2018-10-16 DIAGNOSIS — Z8546 Personal history of malignant neoplasm of prostate: Secondary | ICD-10-CM | POA: Diagnosis not present

## 2018-10-16 DIAGNOSIS — E118 Type 2 diabetes mellitus with unspecified complications: Secondary | ICD-10-CM

## 2018-10-16 DIAGNOSIS — N183 Chronic kidney disease, stage 3 (moderate): Secondary | ICD-10-CM | POA: Diagnosis not present

## 2018-10-16 DIAGNOSIS — R55 Syncope and collapse: Principal | ICD-10-CM

## 2018-10-16 DIAGNOSIS — Z794 Long term (current) use of insulin: Secondary | ICD-10-CM | POA: Diagnosis not present

## 2018-10-16 DIAGNOSIS — R296 Repeated falls: Secondary | ICD-10-CM | POA: Diagnosis not present

## 2018-10-16 DIAGNOSIS — F0150 Vascular dementia without behavioral disturbance: Secondary | ICD-10-CM | POA: Diagnosis not present

## 2018-10-16 DIAGNOSIS — R918 Other nonspecific abnormal finding of lung field: Secondary | ICD-10-CM | POA: Diagnosis not present

## 2018-10-16 DIAGNOSIS — Z9181 History of falling: Secondary | ICD-10-CM | POA: Diagnosis not present

## 2018-10-16 DIAGNOSIS — E1122 Type 2 diabetes mellitus with diabetic chronic kidney disease: Secondary | ICD-10-CM | POA: Diagnosis not present

## 2018-10-16 DIAGNOSIS — Z823 Family history of stroke: Secondary | ICD-10-CM | POA: Diagnosis not present

## 2018-10-16 DIAGNOSIS — Z833 Family history of diabetes mellitus: Secondary | ICD-10-CM | POA: Diagnosis not present

## 2018-10-16 DIAGNOSIS — E785 Hyperlipidemia, unspecified: Secondary | ICD-10-CM | POA: Diagnosis not present

## 2018-10-16 DIAGNOSIS — Z8673 Personal history of transient ischemic attack (TIA), and cerebral infarction without residual deficits: Secondary | ICD-10-CM | POA: Diagnosis not present

## 2018-10-16 DIAGNOSIS — Z7984 Long term (current) use of oral hypoglycemic drugs: Secondary | ICD-10-CM | POA: Diagnosis not present

## 2018-10-16 DIAGNOSIS — I639 Cerebral infarction, unspecified: Secondary | ICD-10-CM

## 2018-10-16 DIAGNOSIS — I361 Nonrheumatic tricuspid (valve) insufficiency: Secondary | ICD-10-CM | POA: Diagnosis not present

## 2018-10-16 DIAGNOSIS — Z1159 Encounter for screening for other viral diseases: Secondary | ICD-10-CM | POA: Diagnosis not present

## 2018-10-16 DIAGNOSIS — I440 Atrioventricular block, first degree: Secondary | ICD-10-CM | POA: Diagnosis not present

## 2018-10-16 DIAGNOSIS — Z7982 Long term (current) use of aspirin: Secondary | ICD-10-CM | POA: Diagnosis not present

## 2018-10-16 LAB — ECHOCARDIOGRAM COMPLETE
Height: 70 in
Weight: 3104.08 oz

## 2018-10-16 LAB — T4, FREE: Free T4: 1.02 ng/dL (ref 0.61–1.12)

## 2018-10-16 LAB — TSH: TSH: 3.843 u[IU]/mL (ref 0.350–4.500)

## 2018-10-16 LAB — SARS CORONAVIRUS 2 BY RT PCR (HOSPITAL ORDER, PERFORMED IN ~~LOC~~ HOSPITAL LAB): SARS Coronavirus 2: NEGATIVE

## 2018-10-16 LAB — GLUCOSE, CAPILLARY: Glucose-Capillary: 99 mg/dL (ref 70–99)

## 2018-10-16 LAB — MAGNESIUM: Magnesium: 2.1 mg/dL (ref 1.7–2.4)

## 2018-10-16 MED ORDER — ATORVASTATIN CALCIUM 40 MG PO TABS
40.0000 mg | ORAL_TABLET | Freq: Every day | ORAL | Status: DC
  Administered 2018-10-16 – 2018-10-17 (×2): 40 mg via ORAL
  Filled 2018-10-16 (×2): qty 1

## 2018-10-16 MED ORDER — METFORMIN HCL 500 MG PO TABS
500.0000 mg | ORAL_TABLET | Freq: Two times a day (BID) | ORAL | Status: DC
  Administered 2018-10-16 – 2018-10-17 (×4): 500 mg via ORAL
  Filled 2018-10-16 (×4): qty 1

## 2018-10-16 MED ORDER — TRAZODONE HCL 50 MG PO TABS
50.0000 mg | ORAL_TABLET | Freq: Every evening | ORAL | Status: DC | PRN
  Filled 2018-10-16: qty 1

## 2018-10-16 MED ORDER — SODIUM CHLORIDE 0.9% FLUSH
3.0000 mL | Freq: Two times a day (BID) | INTRAVENOUS | Status: DC
  Administered 2018-10-16 – 2018-10-17 (×3): 3 mL via INTRAVENOUS

## 2018-10-16 MED ORDER — ASPIRIN EC 81 MG PO TBEC
81.0000 mg | DELAYED_RELEASE_TABLET | Freq: Every day | ORAL | Status: DC
  Administered 2018-10-16 – 2018-10-17 (×2): 81 mg via ORAL
  Filled 2018-10-16 (×2): qty 1

## 2018-10-16 MED ORDER — ENOXAPARIN SODIUM 40 MG/0.4ML ~~LOC~~ SOLN
40.0000 mg | Freq: Every day | SUBCUTANEOUS | Status: DC
  Administered 2018-10-16 – 2018-10-17 (×2): 40 mg via SUBCUTANEOUS
  Filled 2018-10-16 (×2): qty 0.4

## 2018-10-16 NOTE — ED Notes (Signed)
Patient transported to X-ray 

## 2018-10-16 NOTE — Progress Notes (Signed)
  Echocardiogram 2D Echocardiogram has been performed.  Jeremiah Mccoy 10/16/2018, 11:02 AM

## 2018-10-16 NOTE — Telephone Encounter (Signed)
Jeremiah Mccoy called to let us know that pt had a fall and hit his head. He's been admitted to the hospital at this time.

## 2018-10-16 NOTE — Progress Notes (Signed)
Orthostatic's vital's Lying BP 120/66          HR 72  Sitting  BP 115/63              HR 82  Standing BP 110/80                HR 92  Pt stated he felt dizzy after the standing portion of orthostatic vitals. Pt safely returned to bed.

## 2018-10-16 NOTE — Progress Notes (Signed)
Pt continues not following safety measures. Constantly jumping out bed and chair. Pulling telemetry off. Pt has now pulled out 2 IV's . [name] now in a low bed to prevent injury from a fall. RN re-directs the pt and pt states that he "just forgets". Will continue to monitor.

## 2018-10-16 NOTE — Progress Notes (Signed)
Physical Therapy Evaluation Patient Details Name: Jeremiah Mccoy MRN: [id number] DOB: [birth year] Today's Date: 10/16/2018   History of Present Illness  Patient is an 85 year old male with a history of falls. He fell yesterday and hit his head. He was admitted for observation. His head CT was negative for acute abnormality. He does have cervical degeneration but no acute findings. The patient reports a headache. PMH: stroke , prostate cancer, depression, DMII  Clinical Impression  The patient presents with decreased safety awareness and requires assistance with transfers and guarding with gait. He appears to have dementia although he is very pleasant. It is difficult to tell if this confusion is baseline or from the fall.  He ambualted 70' with min a for balance and min tactile cuing to stay in the walker. He reports he does not like the walker and uses a cane at home but it is questionable given how much he leans on the walker for support. He has aides to come in during the day to assist but his most recent fall was at night. He may require 24 hour assistance. He may benefit from an assisted living facility but this may not be much help with the supervision at night. Acute therapy will continue to work with the patient while he is in the hospital.     Follow Up Recommendations Supervision/Assistance - 24 hour;Home health PT(or assisted living )    Equipment Recommendations       Recommendations for Other Services       Precautions / Restrictions Precautions Precautions: Fall Precaution Comments: has had several falls at home Restrictions Weight Bearing Restrictions: No      Mobility  Bed Mobility                  Transfers Overall transfer level: Needs assistance Equipment used: Rolling walker (2 wheeled) Transfers: Sit to/from Stand Sit to Stand: Min assist         General transfer comment: min a for balance to stand for strength and initial balance. Had to elevate the  bed to stand eaiser.   Ambulation/Gait Ambulation/Gait assistance: Min assist Gait Distance (Feet): 40 Feet Assistive device: Rolling walker (2 wheeled) Gait Pattern/deviations: Step-through pattern Gait velocity: decreased Gait velocity interpretation: <1.31 ft/sec, indicative of household ambulator General Gait Details: frequent cuing for proper use of the walker. Min a to stay in the walker. Min a for balance. Questionable how he used a cane at home safely.   Stairs            Wheelchair Mobility    Modified Rankin (Stroke Patients Only)       Balance Overall balance assessment: Needs assistance Sitting-balance support: No upper extremity supported;Feet supported Sitting balance-Leahy Scale: Good     Standing balance support: Bilateral upper extremity supported Standing balance-Leahy Scale: Poor Standing balance comment: min a for transfers and balance with gait                              Pertinent Vitals/Pain Pain Assessment: No/denies pain(slight headache)    Home Living Family/patient expects to be discharged to:: Private residence Living Arrangements: Spouse/significant other Available Help at Discharge: Family Type of Home: Apartment Home Access: Stairs to enter Entrance Stairs-Rails: None Entrance Stairs-Number of Steps: 3 Home Layout: One level   Additional Comments: lives with wife. Per patient wife helps him. Per nursing the patients wife also has medical problems  Prior Function Level of Independence: Independent with assistive device(s)         Comments: Patient reports he uses a cane. he does not like a walker      Hand Dominance   Dominant Hand: Right    Extremity/Trunk Assessment   Upper Extremity Assessment Upper Extremity Assessment: Overall WFL for tasks assessed    Lower Extremity Assessment Lower Extremity Assessment: Overall WFL for tasks assessed       Communication   Communication: No difficulties   Cognition Arousal/Alertness: Awake/alert Behavior During Therapy: Impulsive Overall Cognitive Status: Impaired/Different from baseline Area of Impairment: Attention;Memory;Following commands;Safety/judgement;Awareness                   Current Attention Level: Selective Memory: Decreased short-term memory Following Commands: Follows multi-step commands inconsistently Safety/Judgement: Decreased awareness of safety;Decreased awareness of deficits Awareness: Emergent   General Comments: Patient required frewquent cuing to stay on task. He is very pleasent but frequently tells the same story and asks the same questions. he has poor safety awareness and has very little awareness of his mobility deficits.       General Comments      Exercises     Assessment/Plan    PT Assessment Patient needs continued PT services  PT Problem List Decreased strength;Decreased range of motion;Decreased activity tolerance;Decreased knowledge of use of DME;Decreased safety awareness;Decreased mobility;Pain       PT Treatment Interventions DME instruction;Gait training;Functional mobility training;Therapeutic activities;Neuromuscular re-education;Balance training;Therapeutic exercise;Patient/family education    PT Goals (Current goals can be found in the Care Plan section)  Acute Rehab PT Goals Patient Stated Goal: to go home  PT Goal Formulation: With patient Potential to Achieve Goals: Good    Frequency Min 3X/week   Barriers to discharge Decreased caregiver support high fall risk living with a wife with her own meidcal problems     Co-evaluation               AM-PAC PT "6 Clicks" Mobility  Outcome Measure Help needed turning from your back to your side while in a flat bed without using bedrails?: A Little Help needed moving from lying on your back to sitting on the side of a flat bed without using bedrails?: A Little Help needed moving to and from a bed to a chair (including a  wheelchair)?: A Little Help needed standing up from a chair using your arms (e.g., wheelchair or bedside chair)?: A Little Help needed to walk in hospital room?: A Little Help needed climbing 3-5 steps with a railing? : A Lot 6 Click Score: 17    End of Session Equipment Utilized During Treatment: Gait belt Activity Tolerance: Patient tolerated treatment well Patient left: with call bell/phone within reach;in chair;with chair alarm set Nurse Communication: Mobility status PT Visit Diagnosis: Unsteadiness on feet (R26.81);Muscle weakness (generalized) (M62.81);Difficulty in walking, not elsewhere classified (R26.2);History of falling (Z91.81)    Time:  -      Charges:                Carney Living PT DPT  10/16/2018, 1:10 PM

## 2018-10-16 NOTE — Telephone Encounter (Signed)
I left pt's wife a voicemail letting her know that we will do his labs after he is seen.

## 2018-10-16 NOTE — H&P (Signed)
History and Physical    Jeremiah Mccoy DUK:[id number] DOB: January 08, 1934 DOA: 10/15/2018  PCP: Libby Maw, MD  Patient coming from: Home  I have personally briefly reviewed patient's old medical records in [place]  Chief Complaint: Syncope  HPI: Jeremiah Mccoy is a 85 y.o. male with medical history significant of DM, prior CVAs, prostate CA s/p prostatectomy, suspected mild vascular dementia.  Patient presents to the ED after unwitnessed syncopal event / near syncopal event at home.  According to EMS, the information they received is that patient felt dizzy prior to falling.  Advised that patient hit head on wall though patient doesn't recall this.  Patient is not on any blood thinners.  No external signs of trauma.  EMS reports patient's heart rate variable between 40 and 120, they suspected that he was in atrial fibrillation and apparently gave him 5 mg of metoprolol.  Initially patient denying to EDP that he fell, later states he was sitting on his couch watching TV just fell asleep.  Later, on reassessment he states that he may have actually passed out and he has passed out several times over the past few months.  (EDP began to suspect possible mild dementia at this point).  Attempts made to contact wife for further info but these were unsuccessful.  Patient states this doesn't surprise him as his wife is a heavy sleeper: "if shes asleep, good luck waking her up".  Patient denies CP, SOB, cough, fever, headache.   ED Course: remains in NSR in ED, CT head neg for acute findings.  Remainder of work up thus far unremarkable.   Review of Systems: As per HPI, otherwise all review of systems negative.  Past Medical History:  Diagnosis Date  . Cancer ([place])   . Depression   . Diabetes mellitus without complication ([place])   . Prostate cancer ([place])   . Stroke [facility])     Past Surgical History:  Procedure Laterality Date  . APPENDECTOMY    . CHOLECYSTECTOMY    .  PROSTATECTOMY       reports that he has never smoked. He has never used smokeless tobacco. He reports that he does not drink alcohol or use drugs.  Allergies  Allergen Reactions  . Morphine And Related Other (See Comments)    Dry heaves/ constipation  . Motrin [Ibuprofen] Other (See Comments)    Dry heaves  . Sulfa Antibiotics Other (See Comments)    Dry heaves    Family History  Problem Relation Age of Onset  . Colon cancer Mother   . Stroke Father   . Diabetes Brother   . Breast cancer Neg Hx   . Prostate cancer Neg Hx   . Pancreatic cancer Neg Hx      Prior to Admission medications   Medication Sig Start Date End Date Taking? Authorizing Provider  aspirin EC 81 MG tablet Take 1 tablet (81 mg total) by mouth daily. 05/07/18   Libby Maw, MD  atorvastatin (LIPITOR) 40 MG tablet TAKE 1 TABLET(40 MG) BY MOUTH DAILY AT 6 PM Patient taking differently: Take 40 mg by mouth daily at 6 PM.  09/24/18   Libby Maw, MD  metFORMIN (GLUCOPHAGE) 500 MG tablet TAKE 1 TABLET BY MOUTH TWICE DAILY WITH MEAL 05/08/18   Libby Maw, MD  traZODone (DESYREL) 50 MG tablet TAKE 1 TABLET(50 MG) BY MOUTH AT BEDTIME AS NEEDED FOR SLEEP 08/21/18   Libby Maw, MD  vitamin B-12 1000 MCG tablet  Take 1 tablet (1,000 mcg total) by mouth daily. 01/06/18   Geradine Girt, DO    Physical Exam: Vitals:   10/15/18 2050 10/15/18 2130 10/15/18 2200 10/16/18 0030  BP: 140/89 129/69 (!) 161/73 124/63  Pulse: 66 70  67  Resp: 16 14 19 11   Temp: 98.1 F (36.7 C)     TempSrc: Oral     SpO2: 94% 96%  97%    Constitutional: NAD, calm, comfortable Eyes: PERRL, lids and conjunctivae normal ENMT: Mucous membranes are moist. Posterior pharynx clear of any exudate or lesions.Normal dentition.  Neck: normal, supple, no masses, no thyromegaly Respiratory: clear to auscultation bilaterally, no wheezing, no crackles. Normal respiratory effort. No accessory muscle use.   Cardiovascular: Regular rate and rhythm, no murmurs / rubs / gallops. No extremity edema. 2+ pedal pulses. No carotid bruits.  Abdomen: no tenderness, no masses palpated. No hepatosplenomegaly. Bowel sounds positive.  Musculoskeletal: no clubbing / cyanosis. No joint deformity upper and lower extremities. Good ROM, no contractures. Normal muscle tone.  Skin: no rashes, lesions, ulcers. No induration Neurologic: CN 2-12 grossly intact. Sensation intact, DTR normal. Strength 5/5 in all 4.  Psychiatric: Normal judgment and insight. Alert and oriented x 3. Normal mood.    Labs on Admission: I have personally reviewed following labs and imaging studies  CBC: Recent Labs  Lab 10/15/18 2100  WBC 9.0  HGB 11.7*  HCT 35.2*  MCV 102.0*  PLT 812   Basic Metabolic Panel: Recent Labs  Lab 10/15/18 2100  NA 140  K 4.2  CL 107  CO2 23  GLUCOSE 146*  BUN 22  CREATININE 1.35*  CALCIUM 9.1   GFR: CrCl cannot be calculated (Unknown ideal weight.). Liver Function Tests: No results for input(s): AST, ALT, ALKPHOS, BILITOT, PROT, ALBUMIN in the last 168 hours. No results for input(s): LIPASE, AMYLASE in the last 168 hours. No results for input(s): AMMONIA in the last 168 hours. Coagulation Profile: No results for input(s): INR, PROTIME in the last 168 hours. Cardiac Enzymes: No results for input(s): CKTOTAL, CKMB, CKMBINDEX, TROPONINI in the last 168 hours. BNP (last 3 results) No results for input(s): PROBNP in the last 8760 hours. HbA1C: No results for input(s): HGBA1C in the last 72 hours. CBG: Recent Labs  Lab 10/15/18 2048  GLUCAP 129*   Lipid Profile: No results for input(s): CHOL, HDL, LDLCALC, TRIG, CHOLHDL, LDLDIRECT in the last 72 hours. Thyroid Function Tests: No results for input(s): TSH, T4TOTAL, FREET4, T3FREE, THYROIDAB in the last 72 hours. Anemia Panel: No results for input(s): VITAMINB12, FOLATE, FERRITIN, TIBC, IRON, RETICCTPCT in the last 72 hours. Urine  analysis:    Component Value Date/Time   COLORURINE YELLOW 10/15/2018 2348   APPEARANCEUR CLEAR 10/15/2018 2348   LABSPEC 1.019 10/15/2018 2348   PHURINE 7.0 10/15/2018 2348   GLUCOSEU NEGATIVE 10/15/2018 2348   GLUCOSEU NEGATIVE 03/16/2017 1353   HGBUR NEGATIVE 10/15/2018 2348   BILIRUBINUR NEGATIVE 10/15/2018 2348   KETONESUR NEGATIVE 10/15/2018 2348   PROTEINUR NEGATIVE 10/15/2018 2348   UROBILINOGEN 0.2 03/16/2017 1353   NITRITE NEGATIVE 10/15/2018 2348   LEUKOCYTESUR NEGATIVE 10/15/2018 2348    Radiological Exams on Admission: Ct Head Wo Contrast  Result Date: 10/15/2018 CLINICAL DATA:  Head trauma, minor, GCS>=13, high clinical risk, initial exam; Head trauma, minor, GCS>=13, low clinical risk, initial exam Near syncope and fall at home. Dizziness. EXAM: CT HEAD WITHOUT CONTRAST CT CERVICAL SPINE WITHOUT CONTRAST TECHNIQUE: Multidetector CT imaging of the head and cervical spine  was performed following the standard protocol without intravenous contrast. Multiplanar CT image reconstructions of the cervical spine were also generated. COMPARISON:  Head CT and brain MRI 01/29/2018 FINDINGS: CT HEAD FINDINGS Brain: No evidence of acute infarction, hemorrhage, hydrocephalus, extra-axial collection or mass lesion/mass effect. Generalized atrophy. Moderate chronic small vessel ischemia. Remote infarcts in the right parietal and occipital lobes, small remote left occipital infarct. Tiny remote right cerebellar infarct. Vascular: Insert vest filler Skull: No fracture or focal lesion. Sinuses/Orbits: Minor mucosal thickening of ethmoid air cells. No sinus fluid levels. Mastoid air cells are clear. Included orbits are unremarkable. Other: None. CT CERVICAL SPINE FINDINGS Alignment: Normal. Skull base and vertebrae: No acute fracture. Vertebral body heights are maintained. The dens and skull base are intact. Soft tissues and spinal canal: No prevertebral fluid or swelling. No visible canal hematoma.  Disc levels: Disc space narrowing and endplate spurring are most prominent C5-C6 and C6-C7. Multilevel facet hypertrophy. Upper chest: Carotid calcifications. Other: None. IMPRESSION: 1. No acute intracranial abnormality. No skull fracture. 2. Atrophy and chronic small vessel ischemia. Multiple remote infarcts. 3. Degenerative change in the cervical spine without acute fracture or subluxation. Electronically Signed   By: Keith Rake M.D.   On: 10/15/2018 23:17   Ct Cervical Spine Wo Contrast  Result Date: 10/15/2018 CLINICAL DATA:  Head trauma, minor, GCS>=13, high clinical risk, initial exam; Head trauma, minor, GCS>=13, low clinical risk, initial exam Near syncope and fall at home. Dizziness. EXAM: CT HEAD WITHOUT CONTRAST CT CERVICAL SPINE WITHOUT CONTRAST TECHNIQUE: Multidetector CT imaging of the head and cervical spine was performed following the standard protocol without intravenous contrast. Multiplanar CT image reconstructions of the cervical spine were also generated. COMPARISON:  Head CT and brain MRI 01/29/2018 FINDINGS: CT HEAD FINDINGS Brain: No evidence of acute infarction, hemorrhage, hydrocephalus, extra-axial collection or mass lesion/mass effect. Generalized atrophy. Moderate chronic small vessel ischemia. Remote infarcts in the right parietal and occipital lobes, small remote left occipital infarct. Tiny remote right cerebellar infarct. Vascular: Insert vest filler Skull: No fracture or focal lesion. Sinuses/Orbits: Minor mucosal thickening of ethmoid air cells. No sinus fluid levels. Mastoid air cells are clear. Included orbits are unremarkable. Other: None. CT CERVICAL SPINE FINDINGS Alignment: Normal. Skull base and vertebrae: No acute fracture. Vertebral body heights are maintained. The dens and skull base are intact. Soft tissues and spinal canal: No prevertebral fluid or swelling. No visible canal hematoma. Disc levels: Disc space narrowing and endplate spurring are most prominent  C5-C6 and C6-C7. Multilevel facet hypertrophy. Upper chest: Carotid calcifications. Other: None. IMPRESSION: 1. No acute intracranial abnormality. No skull fracture. 2. Atrophy and chronic small vessel ischemia. Multiple remote infarcts. 3. Degenerative change in the cervical spine without acute fracture or subluxation. Electronically Signed   By: Keith Rake M.D.   On: 10/15/2018 23:17    EKG: Independently reviewed.  Assessment/Plan Principal Problem:   Syncope Active Problems:   Type 2 diabetes mellitus with complication, with long-term current use of insulin (HCC)   CKD (chronic kidney disease), stage III ([place])    1. Syncope - 1. Syncope obs pathway 2. Tele monitor 3. 2d echo 4. ? Possible benefit of halter monitor or event monitor given recent multiple syncopal events, ? Of A.Fib and h/o stroke. 2. DM2 - 1. Continue metformin 3. CKD stage 3 - 1. Chronic, stable, baseline  DVT prophylaxis: Lovenox Code Status: Full Family Communication: No family in room Disposition Plan: Home after admit Consults called: Message sent to P.  Trent for cards to eval for possible halter or event monitor in AM Admission status: Place in obs    [place], [place] Hospitalists  How to contact the [facility] Attending or Consulting provider [place] or covering provider during after hours [place], for this patient?  1. Check the care team in [facility] and look for a) attending/consulting [facility] provider listed and b) the [facility] team listed 2. Log into [email or web address]  Amion Physician Scheduling and messaging for groups and whole hospitals  On call and physician scheduling software for group practices, residents, hospitalists and other medical providers for call, clinic, rotation and shift schedules. OnCall [employer] is a hospital-wide system for scheduling doctors and paging doctors on call. EasyPlot is for scientific plotting and data analysis.  [email or web address]  and use [place]'s universal password to  access. If you do not have the password, please contact the hospital operator.  3. Locate the [facility] provider you are looking for under [facility] and page to a number that you can be directly reached. 4. If you still have difficulty reaching the provider, please page the [facility] (Director on Call) for the Hospitalists listed on amion for assistance.  10/16/2018, 1:16 AM

## 2018-10-16 NOTE — Plan of Care (Signed)
  Problem: Activity: Goal: Risk for activity intolerance will decrease Outcome: Progressing   

## 2018-10-16 NOTE — ED Notes (Signed)
Nurse will draw labs. 

## 2018-10-16 NOTE — Progress Notes (Signed)
Patient is a 85 year old male with history of diabetes mellitus, prior CVAs, prostate cancer status post prostatectomy, vascular dementia, CKD stage III who presents to the emergency department after unwitnessed syncopal event.  Patient said he felt dizzy prior to falling.  CT head done in the emergency department did not show  any acute intracranial abnormalities. Patient admitted for further evaluation.  Admitted under telemetry monitoring.  This morning he is hemodynamically stable.  He denies any lightheadedness or dizziness.  We are checking orthostatic vitals.  Echocardiogram is pending.  Message has been sent to cardiology for possible Holter monitoring. Patient seen by physical therapy today and recommended home health on discharge.  Wife requesting for skilled facility placement. We will continue to monitor the patient.  Anticipate discharge tomorrow.  Patient seen by Dr. Alcario Drought this morning

## 2018-10-16 NOTE — Consult Note (Addendum)
Cardiology Consultation:   Patient ID: Eutimio Gharibian; [id number]; [birth year]   Admit date: 10/15/2018 Date of Consult: 10/16/2018  Primary Care Provider: Libby Maw, MD Primary Cardiologist: Glenetta Hew, MD Primary Electrophysiologist:  None   Patient Profile:   Kolyn Rozario is a 85 y.o. male with a PMH of HTN, HLD, DM type 2, prostate cancer, and multiple CVA who is being seen today for the evaluation of syncope at the request of Dr. Alcario Drought.  History of Present Illness:   Mr. Tinnon was in his usual state of health until the evening of 10/15/2018 when he experienced a syncopal event. He reported feeling dizzy, then lost consciousness and hit his head on the wall during his fall. On EMS arrival his HR was reportedly variable from 40s-120s and it was suspected that he was in atrial fibrillation. He was given IV metoprolol 5mg  prior to arrival. Per H&P, patient provided multiple accounts for events leading up to this admission.   He was last seen by cardiology at an outpatient visit with Jory Sims, NP 06/2017 to establish care. He was referred to cardiology for complaints of chest pain and an abnormal EKG showed incomplete RBBB and lateral T wave abnormalities. He was recommended for an echo and NST at that time. Echo 12/2017 showed EF 60-65%, moderate LVH, incoordinate septal motion and IVS flattening (?elevated RV pressure), G1DD, elevated LV filling pressure, and mild MR/TR. A NST was never completed as patient declined the procedure.   At the time of this evaluation he is without complaints. He enjoys telling me about his past work in the [employer] and as a Engineer, structural in [place]. He adamantly denies loss of consciousness and states his feet got tripped up on a rug causing him to fall. No complaints of chest pain, SOB, dizziness, lightheadedness, or palpitations surrounding the fall. Spoke to his wife, [name], who states the patient went inside and she heard  a crash, and she came inside to find the patient on the floor. He was conscious at that time and states he tripped and fell. She reports that he has been difficult to manage at home. He refuses to wear depends and has difficulty with incontinence. Also refuses to use recommended home equipment for ambulation and falls frequently. She reports difficulty getting him to be compliant with medications. She asks for assistance with placement in a skilled nursing facility.   Hospital course: BP intermittently elevated, otherwise VSS. Labs notable for electrolytes wnl, Cr 1.35, Hgb 11.7, PLT 152, TSH/FT4 wnl, UA negative, COVID-19 negative. EKG with sinus rhythm, rate 65, no STE/D, no TWI, QTc 408. CXR with LLL density suggestive of chronic loculated effusion with atelectasis/scarring; no acute findings. CT C-spine/head without acute fractures, no acute intracrainal abnormalities, and evidence of chronic small vessel ischemia with multiple remote infarcts.   Past Medical History:  Diagnosis Date   Cancer ([place])    Depression    Diabetes mellitus without complication ([place])    Prostate cancer ([place])    Stroke [facility])     Past Surgical History:  Procedure Laterality Date   APPENDECTOMY     CHOLECYSTECTOMY     PROSTATECTOMY       Home Medications:  Prior to Admission medications   Medication Sig Start Date End Date Taking? Authorizing Provider  aspirin EC 81 MG tablet Take 1 tablet (81 mg total) by mouth daily. 05/07/18   Libby Maw, MD  atorvastatin (LIPITOR) 40 MG tablet TAKE 1 TABLET(40 MG) BY MOUTH  DAILY AT 6 PM Patient taking differently: Take 40 mg by mouth daily at 6 PM.  09/24/18   Libby Maw, MD  metFORMIN (GLUCOPHAGE) 500 MG tablet TAKE 1 TABLET BY MOUTH TWICE DAILY WITH MEAL 05/08/18   Libby Maw, MD  traZODone (DESYREL) 50 MG tablet TAKE 1 TABLET(50 MG) BY MOUTH AT BEDTIME AS NEEDED FOR SLEEP 08/21/18   Libby Maw, MD  vitamin B-12 1000 MCG  tablet Take 1 tablet (1,000 mcg total) by mouth daily. 01/06/18   Geradine Girt, DO    Inpatient Medications: Scheduled Meds:  aspirin EC  81 mg Oral Daily   atorvastatin  40 mg Oral q1800   enoxaparin (LOVENOX) injection  40 mg Subcutaneous Daily   metFORMIN  500 mg Oral BID WC   sodium chloride flush  3 mL Intravenous Q12H   Continuous Infusions:  PRN Meds: traZODone  Allergies:    Allergies  Allergen Reactions   Morphine And Related Other (See Comments)    Dry heaves/ constipation   Motrin [Ibuprofen] Other (See Comments)    Dry heaves   Sulfa Antibiotics Other (See Comments)    Dry heaves    Social History:   Social History   Socioeconomic History   Marital status: Married    Spouse name: Not on file   Number of children: Not on file   Years of education: Not on file   Highest education level: Not on file  Occupational History   Occupation: retired    Comment: traffic cop for 22 years  Social Designer, fashion/clothing strain: Not on file   Food insecurity    Worry: Not on file    Inability: Not on Lexicographer needs    Medical: Not on file    Non-medical: Not on file  Tobacco Use   Smoking status: Never Smoker   Smokeless tobacco: Never Used  Substance and Sexual Activity   Alcohol use: No    Frequency: Never   Drug use: No   Sexual activity: Never  Lifestyle   Physical activity    Days per week: Not on file    Minutes per session: Not on file   Stress: Not on file  Relationships   Social connections    Talks on phone: Not on file    Gets together: Not on file    Attends religious service: Not on file    Active member of club or organization: Not on file    Attends meetings of clubs or organizations: Not on file    Relationship status: Not on file   Intimate partner violence    Fear of current or ex partner: Not on file    Emotionally abused: Not on file    Physically abused: Not on file    Forced sexual  activity: Not on file  Other Topics Concern   Not on file  Social History Narrative   Married for 57 years (2019). Moved to [place] in November 2018 to be closer to their daughter. Patient from [facility]. 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     Family History:    Family History  Problem Relation Age of Onset   Colon cancer Mother    Stroke Father    Diabetes Brother    Breast cancer Neg Hx    Prostate cancer Neg Hx    Pancreatic cancer Neg Hx      ROS:  Please see the history of present  illness.   All other ROS reviewed and negative.     Physical Exam/Data:   Vitals:   10/16/18 0218 10/16/18 0304 10/16/18 0307 10/16/18 0810  BP: (!) 160/78 (!) 148/75 136/85 136/89  Pulse: 71 67 71 76  Resp: 18 18  18   Temp: 98.9 F (37.2 C) 98 F (36.7 C)  97.6 F (36.4 C)  TempSrc: Oral Oral  Axillary  SpO2: 100% 99% 97% 96%  Weight: 88 kg     Height: 5\' 10"  (1.778 m)       Intake/Output Summary (Last 24 hours) at 10/16/2018 1110 Last data filed at 10/16/2018 0800 Gross per 24 hour  Intake --  Output 450 ml  Net -450 ml   Filed Weights   10/16/18 0218  Weight: 88 kg   Body mass index is 27.84 kg/m.  General:  Well nourished, well developed, in no acute distress HEENT: sclera anicteric  Neck: no JVD Vascular: No carotid bruits; distal pulses 2+ bilaterally Cardiac:  normal S1, S2; RRR; no murmurs, rubs, or gallops Lungs:  clear to auscultation bilaterally, no wheezing, rhonchi or rales  Abd: NABS, soft, nontender, no hepatomegaly Ext: trace LE edema Musculoskeletal:  No deformities, BUE and BLE strength normal and equal Skin: warm and dry  Neuro:  no focal abnormalities noted; oriented to self, states the president is "that nit-wit", does not know the date or what hospital he's at.  Psych:  Normal affect   EKG:  The EKG was personally reviewed and demonstrates:   sinus rhythm, rate 65, no STE/D, no TWI, QTc 408. Telemetry:  Telemetry was personally  reviewed and demonstrates:  NSR  Relevant CV Studies: Echocardiogram 12/2017: Study Conclusions  - Procedure narrative: Transthoracic echocardiography. Image quality was poor. The study was technically difficult, as a result of poor sound wave transmission. - Left ventricle: The cavity size was normal. Wall thickness was increased in a pattern of moderate LVH. Systolic function was normal. The estimated ejection fraction was in the range of 60% to 65%. Incoordinate septal motion. Doppler parameters are consistent with abnormal left ventricular relaxation (grade 1 diastolic dysfunction). The E/e&' ratio is >15, suggesting elevated LV filling pressure. - Ventricular septum: Septal motion showed abnormal function and dyssynergy. The contour showed diastolic flattening and systolic flattening. - Mitral valve: Mildly thickened leaflets . There was mild   regurgitation. - Left atrium: The atrium was normal in size. - Tricuspid valve: There was mild regurgitation. - Pulmonary arteries: PA peak pressure: 40 mm Hg (S) + RAP. - Systemic veins: Not visualized.  Impressions:  - Technically difficult study. LVEF 60-65%, modeate LVH,   incoordinate septal motion and IVS flattening (may suggest elevated RV pressure), grade 1 DD, elevated LV filling pressure, mild MR, normal LA size, mild TR, RVSP 40 mmHg + RAP, IVC was not visualized, no pericardial effusion.  Laboratory Data:  Chemistry Recent Labs  Lab 10/15/18 2100  NA 140  K 4.2  CL 107  CO2 23  GLUCOSE 146*  BUN 22  CREATININE 1.35*  CALCIUM 9.1  GFRNONAA 48*  GFRAA 56*  ANIONGAP 10    No results for input(s): PROT, ALBUMIN, AST, ALT, ALKPHOS, BILITOT in the last 168 hours. Hematology Recent Labs  Lab 10/15/18 2100  WBC 9.0  RBC 3.45*  HGB 11.7*  HCT 35.2*  MCV 102.0*  MCH 33.9  MCHC 33.2  RDW 13.2  PLT 152   Cardiac EnzymesNo results for input(s): TROPONINI in the last 168 hours. No results for input(s):  TROPIPOC in  the last 168 hours.  BNPNo results for input(s): BNP, PROBNP in the last 168 hours.  DDimer No results for input(s): DDIMER in the last 168 hours.  Radiology/Studies:  X-ray Chest Pa And Lateral  Result Date: 10/16/2018 CLINICAL DATA:  85 year old male with syncope. EXAM: CHEST - 2 VIEW COMPARISON:  Chest CT dated 01/31/2018 FINDINGS: Left lung base opacity corresponds to the loculated effusion and associated left lung base atelectasis/scarring. The right lung is clear. No pneumothorax. Stable cardiac silhouette. No acute osseous pathology. IMPRESSION: Left lung base density similar to prior CT likely combination of chronic loculated effusion and associated atelectasis/scarring. No new consolidative changes. Electronically Signed   By: Anner Crete M.D.   On: 10/16/2018 02:08   Ct Head Wo Contrast  Result Date: 10/15/2018 CLINICAL DATA:  Head trauma, minor, GCS>=13, high clinical risk, initial exam; Head trauma, minor, GCS>=13, low clinical risk, initial exam Near syncope and fall at home. Dizziness. EXAM: CT HEAD WITHOUT CONTRAST CT CERVICAL SPINE WITHOUT CONTRAST TECHNIQUE: Multidetector CT imaging of the head and cervical spine was performed following the standard protocol without intravenous contrast. Multiplanar CT image reconstructions of the cervical spine were also generated. COMPARISON:  Head CT and brain MRI 01/29/2018 FINDINGS: CT HEAD FINDINGS Brain: No evidence of acute infarction, hemorrhage, hydrocephalus, extra-axial collection or mass lesion/mass effect. Generalized atrophy. Moderate chronic small vessel ischemia. Remote infarcts in the right parietal and occipital lobes, small remote left occipital infarct. Tiny remote right cerebellar infarct. Vascular: Insert vest filler Skull: No fracture or focal lesion. Sinuses/Orbits: Minor mucosal thickening of ethmoid air cells. No sinus fluid levels. Mastoid air cells are clear. Included orbits are unremarkable. Other: None. CT CERVICAL  SPINE FINDINGS Alignment: Normal. Skull base and vertebrae: No acute fracture. Vertebral body heights are maintained. The dens and skull base are intact. Soft tissues and spinal canal: No prevertebral fluid or swelling. No visible canal hematoma. Disc levels: Disc space narrowing and endplate spurring are most prominent C5-C6 and C6-C7. Multilevel facet hypertrophy. Upper chest: Carotid calcifications. Other: None. IMPRESSION: 1. No acute intracranial abnormality. No skull fracture. 2. Atrophy and chronic small vessel ischemia. Multiple remote infarcts. 3. Degenerative change in the cervical spine without acute fracture or subluxation. Electronically Signed   By: Keith Rake M.D.   On: 10/15/2018 23:17   Ct Cervical Spine Wo Contrast  Result Date: 10/15/2018 CLINICAL DATA:  Head trauma, minor, GCS>=13, high clinical risk, initial exam; Head trauma, minor, GCS>=13, low clinical risk, initial exam Near syncope and fall at home. Dizziness. EXAM: CT HEAD WITHOUT CONTRAST CT CERVICAL SPINE WITHOUT CONTRAST TECHNIQUE: Multidetector CT imaging of the head and cervical spine was performed following the standard protocol without intravenous contrast. Multiplanar CT image reconstructions of the cervical spine were also generated. COMPARISON:  Head CT and brain MRI 01/29/2018 FINDINGS: CT HEAD FINDINGS Brain: No evidence of acute infarction, hemorrhage, hydrocephalus, extra-axial collection or mass lesion/mass effect. Generalized atrophy. Moderate chronic small vessel ischemia. Remote infarcts in the right parietal and occipital lobes, small remote left occipital infarct. Tiny remote right cerebellar infarct. Vascular: Insert vest filler Skull: No fracture or focal lesion. Sinuses/Orbits: Minor mucosal thickening of ethmoid air cells. No sinus fluid levels. Mastoid air cells are clear. Included orbits are unremarkable. Other: None. CT CERVICAL SPINE FINDINGS Alignment: Normal. Skull base and vertebrae: No acute  fracture. Vertebral body heights are maintained. The dens and skull base are intact. Soft tissues and spinal canal: No prevertebral fluid or swelling. No visible  canal hematoma. Disc levels: Disc space narrowing and endplate spurring are most prominent C5-C6 and C6-C7. Multilevel facet hypertrophy. Upper chest: Carotid calcifications. Other: None. IMPRESSION: 1. No acute intracranial abnormality. No skull fracture. 2. Atrophy and chronic small vessel ischemia. Multiple remote infarcts. 3. Degenerative change in the cervical spine without acute fracture or subluxation. Electronically Signed   By: Keith Rake M.D.   On: 10/15/2018 23:17    Assessment and Plan:   1. Unwitnessed fall: patient presented after a fall yesterday. Patient is adamant that he did not lose consciousness and wife reported her was alert immediately afterwards ran inside to check on him. EKG with NSR; ?report of Afib by EMS, though no evidence on my review of the EMS run sheet. Telemetry without arrhythmia. He has a history of multiple CVAs but no documented history of atrial fibrillation/flutter. Cannot exclude arrhythmia. - Echo pending - Will plan for cardiac monitor after discharge.  - PT eval recommends home PT which patient has not cooperated with in the past per wife.   2. HTN: BP stable, not on any medications - Continue to monitor  3. HLD: LDL 90 04/2018 - Continue atorvastatin  4. History of multiple CVA's: CT head without acute findings but revealed small vessel ischemia and old CVAs - Continue aspirin and statin  5. Social disposition: patient's wife reports difficulty caring for him at home. She requests placement in a skilled nursing facility     Signed, Abigail Butts, Hershal Coria  10/16/2018 11:10 AM [phone]  ATTENDING ATTESTATION  I have seen, examined and evaluated the patient this PM along with Abigail Butts, PA-C .  After reviewing all the available data and chart, we discussed the patients  laboratory, study & physical findings as well as symptoms in detail. I agree with her findings, examination as well as impression recommendations as per our discussion.    Mr. Rosalee Kaufman was admitted with a diagnosis of syncope, however with both my and Ms. Curlene Dolphin discussion with him, he denies any loss of consciousness.  He does not think he passed out.  He thinks he lost his balance.  Unfortunately, it was unwitnessed, and he is not a great historian.  Echocardiogram was relatively normal.  Most likely he lost his balance and fell.  I do think is not unreasonable to check an event monitor for 30days post discharge.  We can consider carotid Dopplers, however they are not likely be helpful.  Simply continue statin and aspirin.  More likely he probably needs physical and occupational therapy evaluation to determine his balance level and his ability to care for himself.  We will arrange for outpatient 30-day monitor to exclude any arrhythmias.  Otherwise will sign off.  [facility] will sign off.   Medication Recommendations:  none Other recommendations (labs, testing, etc):  Will arrange 30 d event monitor post d/c Follow up as an outpatient:  If Monitor is abnormal, will contact re: f/u.     Glenetta Hew, M.D., M.S. Interventional Cardiologist   Pager # [phone] Phone # [phone] [address] [place], [place] [address]      For questions or updates, please contact [place] Please consult [email or web address] for contact info under Cardiology/STEMI.

## 2018-10-16 NOTE — ED Notes (Signed)
ED TO INPATIENT HANDOFF REPORT  ED Nurse Name and Phone #: [phone] Threasa Beards, RN  S Name/Age/Gender Jeremiah Mccoy 85 y.o. male Room/Bed: 023C/023C  Code Status   Code Status: Full Code  Home/SNF/Other Home Patient oriented to: self, place and situation Is this baseline? Yes   Triage Complete: Triage complete  Chief Complaint fall  Triage Note Pt from home with ems for near syncope and fall. Pt felt dizzy prior to fall, hit his head on the wall, pt denies taking any blood thinners. Hr ranging from 40-120 a fib, no hx of a fib. Hx of stroke in November. 5mg  metoprolol given en route. Pt arrives to ED alert and oriented   Allergies Allergies  Allergen Reactions  . Morphine And Related Other (See Comments)    Dry heaves/ constipation  . Motrin [Ibuprofen] Other (See Comments)    Dry heaves  . Sulfa Antibiotics Other (See Comments)    Dry heaves    Level of Care/Admitting Diagnosis ED Disposition    ED Disposition Condition [facility] Area: [place] [[id number]]  Level of Care: Telemetry Medical [104]  I expect the patient will be discharged within 24 hours: Yes  LOW acuity---Tx typically complete <24 hrs---ACUTE conditions typically can be evaluated <24 hours---LABS likely to return to acceptable levels <24 hours---IS near functional baseline---EXPECTED to return to current living arrangement---NOT newly hypoxic: Meets criteria for 5C-Observation unit  Covid Evaluation: Asymptomatic Screening Protocol (No Symptoms)  Diagnosis: Syncope [206001]  Admitting Physician: Etta Quill [[id number]]  Attending Physician: Etta Quill [[id number]]  PT Class (Do Not Modify): Observation [104]  PT Acc Code (Do Not Modify): Observation [10022]       B Medical/Surgery History Past Medical History:  Diagnosis Date  . Cancer ([place])   . Depression   . Diabetes mellitus without complication ([place])   . Prostate cancer ([place])   . Stroke [facility])    Past  Surgical History:  Procedure Laterality Date  . APPENDECTOMY    . CHOLECYSTECTOMY    . PROSTATECTOMY       A IV Location/Drains/Wounds Patient Lines/Drains/Airways Status   Active Line/Drains/Airways    Name:   Placement date:   Placement time:   Site:   Days:   Peripheral IV 10/15/18 Left Wrist   10/15/18    2044    Wrist   1   Peripheral IV 10/15/18 Right Antecubital   10/15/18    2101    Antecubital   1          Intake/Output Last 24 hours No intake or output data in the 24 hours ending 10/16/18 0113  Labs/Imaging Results for orders placed or performed during the hospital encounter of 10/15/18 (from the past 48 hour(s))  CBG monitoring, ED     Status: Abnormal   Collection Time: 10/15/18  8:48 PM  Result Value Ref Range   Glucose-Capillary 129 (H) 70 - 99 mg/dL  Basic metabolic panel     Status: Abnormal   Collection Time: 10/15/18  9:00 PM  Result Value Ref Range   Sodium 140 135 - 145 mmol/L   Potassium 4.2 3.5 - 5.1 mmol/L   Chloride 107 98 - 111 mmol/L   CO2 23 22 - 32 mmol/L   Glucose, Bld 146 (H) 70 - 99 mg/dL   BUN 22 8 - 23 mg/dL   Creatinine, Ser 1.35 (H) 0.61 - 1.24 mg/dL   Calcium 9.1 8.9 - 10.3 mg/dL  GFR calc non Af Amer 48 (L) >60 mL/min   GFR calc Af Amer 56 (L) >60 mL/min   Anion gap 10 5 - 15    Comment: Performed at [facility] [address] [place], [place] [address]  CBC     Status: Abnormal   Collection Time: 10/15/18  9:00 PM  Result Value Ref Range   WBC 9.0 4.0 - 10.5 K/uL   RBC 3.45 (L) 4.22 - 5.81 MIL/uL   Hemoglobin 11.7 (L) 13.0 - 17.0 g/dL   HCT 35.2 (L) 39.0 - 52.0 %   MCV 102.0 (H) 80.0 - 100.0 fL   MCH 33.9 26.0 - 34.0 pg   MCHC 33.2 30.0 - 36.0 g/dL   RDW 13.2 11.5 - 15.5 %   Platelets 152 150 - 400 K/uL   nRBC 0.0 0.0 - 0.2 %    Comment: Performed at [facility], [place] [address] [place],  [address]  Urinalysis, Routine w reflex microscopic     Status: None   Collection Time: 10/15/18 11:48 PM   Result Value Ref Range   Color, Urine YELLOW YELLOW   APPearance CLEAR CLEAR   Specific Gravity, Urine 1.019 1.005 - 1.030   pH 7.0 5.0 - 8.0   Glucose, UA NEGATIVE NEGATIVE mg/dL   Hgb urine dipstick NEGATIVE NEGATIVE   Bilirubin Urine NEGATIVE NEGATIVE   Ketones, ur NEGATIVE NEGATIVE mg/dL   Protein, ur NEGATIVE NEGATIVE mg/dL   Nitrite NEGATIVE NEGATIVE   Leukocytes,Ua NEGATIVE NEGATIVE    Comment: Performed at [facility] [address] [place], [place] [address]   Ct Head Wo Contrast  Result Date: 10/15/2018 CLINICAL DATA:  Head trauma, minor, GCS>=13, high clinical risk, initial exam; Head trauma, minor, GCS>=13, low clinical risk, initial exam Near syncope and fall at home. Dizziness. EXAM: CT HEAD WITHOUT CONTRAST CT CERVICAL SPINE WITHOUT CONTRAST TECHNIQUE: Multidetector CT imaging of the head and cervical spine was performed following the standard protocol without intravenous contrast. Multiplanar CT image reconstructions of the cervical spine were also generated. COMPARISON:  Head CT and brain MRI 01/29/2018 FINDINGS: CT HEAD FINDINGS Brain: No evidence of acute infarction, hemorrhage, hydrocephalus, extra-axial collection or mass lesion/mass effect. Generalized atrophy. Moderate chronic small vessel ischemia. Remote infarcts in the right parietal and occipital lobes, small remote left occipital infarct. Tiny remote right cerebellar infarct. Vascular: Insert vest filler Skull: No fracture or focal lesion. Sinuses/Orbits: Minor mucosal thickening of ethmoid air cells. No sinus fluid levels. Mastoid air cells are clear. Included orbits are unremarkable. Other: None. CT CERVICAL SPINE FINDINGS Alignment: Normal. Skull base and vertebrae: No acute fracture. Vertebral body heights are maintained. The dens and skull base are intact. Soft tissues and spinal canal: No prevertebral fluid or swelling. No visible canal hematoma. Disc levels: Disc space narrowing and endplate spurring are  most prominent C5-C6 and C6-C7. Multilevel facet hypertrophy. Upper chest: Carotid calcifications. Other: None. IMPRESSION: 1. No acute intracranial abnormality. No skull fracture. 2. Atrophy and chronic small vessel ischemia. Multiple remote infarcts. 3. Degenerative change in the cervical spine without acute fracture or subluxation. Electronically Signed   By: Keith Rake M.D.   On: 10/15/2018 23:17   Ct Cervical Spine Wo Contrast  Result Date: 10/15/2018 CLINICAL DATA:  Head trauma, minor, GCS>=13, high clinical risk, initial exam; Head trauma, minor, GCS>=13, low clinical risk, initial exam Near syncope and fall at home. Dizziness. EXAM: CT HEAD WITHOUT CONTRAST CT CERVICAL SPINE WITHOUT CONTRAST TECHNIQUE:  Multidetector CT imaging of the head and cervical spine was performed following the standard protocol without intravenous contrast. Multiplanar CT image reconstructions of the cervical spine were also generated. COMPARISON:  Head CT and brain MRI 01/29/2018 FINDINGS: CT HEAD FINDINGS Brain: No evidence of acute infarction, hemorrhage, hydrocephalus, extra-axial collection or mass lesion/mass effect. Generalized atrophy. Moderate chronic small vessel ischemia. Remote infarcts in the right parietal and occipital lobes, small remote left occipital infarct. Tiny remote right cerebellar infarct. Vascular: Insert vest filler Skull: No fracture or focal lesion. Sinuses/Orbits: Minor mucosal thickening of ethmoid air cells. No sinus fluid levels. Mastoid air cells are clear. Included orbits are unremarkable. Other: None. CT CERVICAL SPINE FINDINGS Alignment: Normal. Skull base and vertebrae: No acute fracture. Vertebral body heights are maintained. The dens and skull base are intact. Soft tissues and spinal canal: No prevertebral fluid or swelling. No visible canal hematoma. Disc levels: Disc space narrowing and endplate spurring are most prominent C5-C6 and C6-C7. Multilevel facet hypertrophy. Upper chest:  Carotid calcifications. Other: None. IMPRESSION: 1. No acute intracranial abnormality. No skull fracture. 2. Atrophy and chronic small vessel ischemia. Multiple remote infarcts. 3. Degenerative change in the cervical spine without acute fracture or subluxation. Electronically Signed   By: Keith Rake M.D.   On: 10/15/2018 23:17    Pending Labs Unresulted Labs (From admission, onward)    Start     Ordered   10/16/18 0058  Magnesium  Once,   STAT     10/16/18 0057   10/16/18 0041  TSH  Once,   STAT     10/16/18 0040   10/16/18 0041  T4, free  Once,   STAT     10/16/18 0040          Vitals/Pain Today's Vitals   10/15/18 2130 10/15/18 2200 10/15/18 2330 10/16/18 0030  BP: 129/69 (!) 161/73  124/63  Pulse: 70   67  Resp: 14 19  11   Temp:      TempSrc:      SpO2: 96%   97%  PainSc:   0-No pain     Isolation Precautions No active isolations  Medications Medications  atorvastatin (LIPITOR) tablet 40 mg (has no administration in time range)  metFORMIN (GLUCOPHAGE) tablet 500 mg (has no administration in time range)  traZODone (DESYREL) tablet 50 mg (has no administration in time range)  aspirin EC tablet 81 mg (has no administration in time range)  sodium chloride flush (NS) 0.9 % injection 3 mL (has no administration in time range)  enoxaparin (LOVENOX) injection 40 mg (has no administration in time range)  sodium chloride flush (NS) 0.9 % injection 3 mL (3 mLs Intravenous Given 10/15/18 2200)    Mobility walks with device High fall risk   Focused Assessments Neuro Assessment Handoff:  Swallow screen pass? yes Cardiac Rhythm: Normal sinus rhythm       Neuro Assessment: Exceptions to WDL Neuro Checks:      Last Documented NIHSS Modified Score:   Has TPA been given? No If patient is a Neuro Trauma and patient is going to OR before floor call report to [place] nurse: [phone] or [phone]     R Recommendations: See Admitting Provider Note  Report  given to:   Additional Notes:

## 2018-10-17 ENCOUNTER — Other Ambulatory Visit: Payer: Self-pay | Admitting: Cardiology

## 2018-10-17 ENCOUNTER — Encounter (HOSPITAL_COMMUNITY): Payer: Self-pay | Admitting: *Deleted

## 2018-10-17 LAB — GLUCOSE, CAPILLARY: Glucose-Capillary: 103 mg/dL — ABNORMAL HIGH (ref 70–99)

## 2018-10-17 NOTE — Progress Notes (Signed)
NURSING PROGRESS NOTE  Jeremiah Mccoy [id number] Discharge Data: 10/17/2018 5:11 PM Attending Provider: Shelly Coss, MD PJK:[id number], Mortimer Fries, MD     [name] to be D/C'd Home per MD order.  Discussed with the patient the After Visit Summary and all questions fully answered. All IV's discontinued with no bleeding noted. All belongings returned to patient for patient to take home.   Last Vital Signs:  Blood pressure 130/85, pulse 68, temperature 97.8 F (36.6 C), temperature source Oral, resp. rate 16, height 5\' 10"  (1.778 m), weight 90.7 kg, SpO2 99 %.  Discharge Medication List Allergies as of 10/17/2018      Reactions   Morphine And Related Other (See Comments)   Dry heaves/ constipation   Motrin [ibuprofen] Other (See Comments)   Dry heaves   Sulfa Antibiotics Other (See Comments)   Dry heaves      Medication List    TAKE these medications   aspirin EC 81 MG tablet Take 1 tablet (81 mg total) by mouth daily. Notes to patient: 10/18/2018   atorvastatin 40 MG tablet Commonly known as: LIPITOR TAKE 1 TABLET(40 MG) BY MOUTH DAILY AT 6 PM What changed: See the new instructions. Notes to patient: 10/18/2018   cyanocobalamin 1000 MCG tablet Take 1 tablet (1,000 mcg total) by mouth daily. Notes to patient: 10/18/2018   metFORMIN 500 MG tablet Commonly known as: GLUCOPHAGE TAKE 1 TABLET BY MOUTH TWICE DAILY WITH MEAL Notes to patient: 10/17/2018   traZODone 50 MG tablet Commonly known as: DESYREL TAKE 1 TABLET(50 MG) BY MOUTH AT BEDTIME AS NEEDED FOR SLEEP

## 2018-10-17 NOTE — TOC Initial Note (Signed)
Transition of Care [facility]) - Initial/Assessment Note    Patient Details  Name: Jeremiah Mccoy MRN: [id number] Date of Birth: 10-17-1933  Transition of Care [facility]) CM/SW Contact:    Jeremiah Ochs, LCSW Phone Number: 10/17/2018, 3:53 PM  Clinical Narrative:   CSW spoke with patient's wife, Jeremiah Mccoy, via phone to discuss discharge plans. Wife requesting SNF placement due to patient's behaviors at home, he doesn't want to do anything for himself and she is tired of caring for him. Wife indicated that she had hired caregivers to help her before the pandemic, but they've been quarantining in the house and she doesn't want anyone coming into the house, so she asked them to stop coming. CSW discussed how patient doesn't qualify for SNF (double checked with PT), so she could private pay if she wanted to or look into ALF placement for the patient. Wife says she cannot afford that. CSW discussed home health services, and wife is refusing at this time; says she doesn't want strangers to come into her house at this time. Orders are in place if the wife changes her mind. Wife to pick the patient up this afternoon.                 Expected Discharge Plan: Home/Self Care Barriers to Discharge: Barriers Resolved   Patient Goals and CMS Choice Patient states their goals for this hospitalization and ongoing recovery are:: patient unable to participate in goal setting at this time   Choice offered to / list presented to : Spouse  Expected Discharge Plan and Services Expected Discharge Plan: Home/Self Care     Post Acute Care Choice: NA Living arrangements for the past 2 months: Apartment Expected Discharge Date: 10/17/18                                    Prior Living Arrangements/Services Living arrangements for the past 2 months: Apartment Lives with:: Spouse Patient language and need for interpreter reviewed:: No Do you feel safe going back to the place where you live?: Yes      Need  for Family Participation in Patient Care: Yes (Comment) Care giver support system in place?: Yes (comment) Current home services: DME Criminal Activity/Legal Involvement Pertinent to Current Situation/Hospitalization: No - Comment as needed  Activities of Daily Living      Permission Sought/Granted Permission sought to share information with : Family Supports Permission granted to share information with : Yes, Verbal Permission Granted  Share Information with NAME: Jeremiah Mccoy granted to share info w Relationship: Spouse     Emotional Assessment   Attitude/Demeanor/Rapport: Unable to Assess Affect (typically observed): Unable to Assess Orientation: : Oriented to Self, Oriented to Place Alcohol / Substance Use: Not Applicable Psych Involvement: No (comment)  Admission diagnosis:  Syncope and collapse [R55] Syncope [R55] Patient Active Problem List   Diagnosis Date Noted  . Syncope 10/16/2018  . Elevated LDL cholesterol level 05/07/2018  . Elevated TSH 05/07/2018  . Hypothyroidism 05/07/2018  . Slow transit constipation 05/07/2018  . Insomnia 02/15/2018  . Bronchitis 02/07/2018  . Type II diabetes mellitus with renal manifestations ([place]) 01/29/2018  . HLD (hyperlipidemia) 01/29/2018  . Prostate cancer ([place]) 01/29/2018  . CKD (chronic kidney disease), stage III ([place]) 01/29/2018  . Dizziness 01/29/2018  . Hospital discharge follow-up 01/11/2018  . Thrombocytopenia ([place]) 01/05/2018  . AKI (acute kidney injury) ([place]) 01/05/2018  .  Lightheadedness 01/04/2018  . Stroke ([place]) 01/04/2018  . Recurrent prostate cancer ([place]) 10/24/2017  . Traumatic ecchymosis of abdominal wall 10/19/2017  . Macrocytosis without anemia 07/17/2017  . Macrocytosis 07/17/2017  . GERD (gastroesophageal reflux disease) 06/28/2017  . Chest pain 06/22/2017  . Elevated PSA, less than 10 ng/ml 04/17/2017  . Type 2 diabetes mellitus with complication, with long-term current use of insulin  ([place]) 03/15/2017  . Elevated cholesterol 03/15/2017  . History of CVA (cerebrovascular accident) 03/15/2017  . Non-compliance 03/15/2017  . History of prostate cancer 03/15/2017  . Continuous leakage of urine 03/15/2017  . Erectile dysfunction following prostate ablative therapy 03/15/2017   PCP:  Libby Maw, MD Pharmacy:   [facility] [phone] [address], [place] [address] AT [facility] [address] [place] [address] Phone: [phone] Fax: [phone]     Social Determinants of Health ([place]) Interventions    Readmission Risk Interventions No flowsheet data found.

## 2018-10-17 NOTE — Progress Notes (Signed)
Physical Therapy Treatment Patient Details Name: Jeremiah Mccoy MRN: [id number] DOB: 03-16-1934 Today's Date: 10/17/2018    History of Present Illness Patient is an 85 year old male with a history of falls. He fell yesterday and hit his head. He was admitted for observation. His head CT was negative for acute abnormality. He does have cervical degeneration but no acute findings. The patient reports a headache. PMH: stroke , prostate cancer, depression, DMII    PT Comments    Patient seen for mobility progression. Pt requires min guard/min A for OOB mobility using SPC this session. Pt presents with short term memory deficits although not sure how different he is from his baseline cogntion. Continue to progress as tolerated.     Follow Up Recommendations  Supervision/Assistance - 24 hour;Home health PT(or ALF )     Equipment Recommendations  Rolling walker with 5" wheels    Recommendations for Other Services       Precautions / Restrictions Precautions Precautions: Fall Precaution Comments: has had several falls at home Restrictions Weight Bearing Restrictions: No    Mobility  Bed Mobility Overal bed mobility: Needs Assistance Bed Mobility: Supine to Sit     Supine to sit: Min assist     General bed mobility comments: assist to elevate trunk into sitting  Transfers Overall transfer level: Needs assistance Equipment used: None Transfers: Sit to/from Stand Sit to Stand: Min guard         General transfer comment: min guard for safety  Ambulation/Gait Ambulation/Gait assistance: Min assist;Min guard Gait Distance (Feet): 200 Feet Assistive device: Straight cane Gait Pattern/deviations: Step-through pattern;Decreased stride length;Wide base of support Gait velocity: decreased   General Gait Details: cues for increased bilat step lengths and redirection to task   Stairs             Wheelchair Mobility    Modified Rankin (Stroke Patients Only)        Balance Overall balance assessment: Needs assistance Sitting-balance support: No upper extremity supported;Feet supported Sitting balance-Leahy Scale: Good     Standing balance support: Single extremity supported;During functional activity Standing balance-Leahy Scale: Poor                              Cognition Arousal/Alertness: Awake/alert Behavior During Therapy: WFL for tasks assessed/performed Overall Cognitive Status: No family/caregiver present to determine baseline cognitive functioning Area of Impairment: Attention;Memory;Following commands;Safety/judgement;Awareness                   Current Attention Level: Selective Memory: Decreased short-term memory Following Commands: Follows multi-step commands inconsistently Safety/Judgement: Decreased awareness of safety;Decreased awareness of deficits Awareness: Emergent          Exercises      General Comments        Pertinent Vitals/Pain Pain Assessment: No/denies pain    Home Living                      Prior Function            PT Goals (current goals can now be found in the care plan section) Acute Rehab PT Goals Patient Stated Goal: to go home  Progress towards PT goals: Progressing toward goals    Frequency    Min 3X/week      PT Plan Current plan remains appropriate    Co-evaluation              AM-PAC  PT "6 Clicks" Mobility   Outcome Measure  Help needed turning from your back to your side while in a flat bed without using bedrails?: A Little Help needed moving from lying on your back to sitting on the side of a flat bed without using bedrails?: A Little Help needed moving to and from a bed to a chair (including a wheelchair)?: A Little Help needed standing up from a chair using your arms (e.g., wheelchair or bedside chair)?: A Little Help needed to walk in hospital room?: A Little Help needed climbing 3-5 steps with a railing? : A Lot 6 Click Score:  17    End of Session Equipment Utilized During Treatment: Gait belt Activity Tolerance: Patient tolerated treatment well Patient left: with call bell/phone within reach;in chair;with chair alarm set Nurse Communication: Mobility status PT Visit Diagnosis: Unsteadiness on feet (R26.81);Muscle weakness (generalized) (M62.81);Difficulty in walking, not elsewhere classified (R26.2);History of falling (Z91.81)     Time: 3557-3220 PT Time Calculation (min) (ACUTE ONLY): 25 min  Charges:  $Gait Training: 23-37 mins                     Earney Navy, PTA Acute Rehabilitation Services Pager: [phone] Office: [phone]     Darliss Cheney 10/17/2018, 1:51 PM

## 2018-10-17 NOTE — Discharge Summary (Signed)
Physician Discharge Summary  Jeremiah Mccoy TIW:[id number] DOB: 1933/08/30 DOA: 10/15/2018  PCP: Libby Maw, MD  Admit date: 10/15/2018 Discharge date: 10/17/2018  Admitted From: Home Disposition:  Home  Discharge Condition:Stable CODE STATUS:FULL Diet recommendation: Heart Healthy  Brief/Interim Summary:  Patient is a 85 year old male with history of diabetes mellitus, prior CVAs, prostate cancer status post prostatectomy, vascular dementia, CKD stage III who presents to the emergency department after unwitnessed syncopal event.  Patient said he felt dizzy prior to falling.  CT head done in the emergency department did not show  any acute intracranial abnormalities. Patient admitted for further evaluation.  Admitted under telemetry monitoring.  This morning he is hemodynamically stable.  He denies any lightheadedness or dizziness.  Work-up completed.  Patient seen by physical therapy and recommended home health physical therapy.  He is hemodynamically stable for discharge.  Following problems were addressed during his hospitalization;  Syncope/fall: Unknown etiology.  Full work-up done.  EKG showed sinus rhythm with first-degree AV block.  Echocardiogram showed ejection fraction of 60 to 65%, impaired left ventricular relaxation, normal regional wall motion abnormalities, lipomatous interatrial septum.  Orthostatic vitals found to be negative.  He remained hemodynamically stable. He was evaluated by PT/OT and recommended home health on discharge. Cardiology was also consulted and signed off.  Cardiology will arrange outpatient 30 day monitoring to exclude any arrhythmias.  Diabetes mellitus: Continue home regimen on discharge.  CKD stage III: Currently kidney function is at baseline.  Hyperlipidemia: Continue statin  Hypertension: Currently blood pressure stable.  Not on medicines at home.  History of multiple CVAs in the past: Continue aspirin and statin.  CT head did not  show any acute intracranial abnormalities.  Disposition: Discussed with patient's wife multiple times.  She requested him to be placed in a facility.  Physical therapy recommended home health with physical therapy.  I have requested social worker to look into this case.    Discharge Diagnoses:  Principal Problem:   Syncope Active Problems:   Type 2 diabetes mellitus with complication, with long-term current use of insulin (HCC)   CKD (chronic kidney disease), stage III [facility])    Discharge Instructions  Discharge Instructions    Diet - low sodium heart healthy   Complete by: As directed    Discharge instructions   Complete by: As directed    1)Continue your home medications. 2)Follow up with home health physical therapy. 3)Follow up with your PCP in 1-2 weeks.   Increase activity slowly   Complete by: As directed      Allergies as of 10/17/2018      Reactions   Morphine And Related Other (See Comments)   Dry heaves/ constipation   Motrin [ibuprofen] Other (See Comments)   Dry heaves   Sulfa Antibiotics Other (See Comments)   Dry heaves      Medication List    TAKE these medications   aspirin EC 81 MG tablet Take 1 tablet (81 mg total) by mouth daily.   atorvastatin 40 MG tablet Commonly known as: LIPITOR TAKE 1 TABLET(40 MG) BY MOUTH DAILY AT 6 PM What changed: See the new instructions.   cyanocobalamin 1000 MCG tablet Take 1 tablet (1,000 mcg total) by mouth daily.   metFORMIN 500 MG tablet Commonly known as: GLUCOPHAGE TAKE 1 TABLET BY MOUTH TWICE DAILY WITH MEAL   traZODone 50 MG tablet Commonly known as: DESYREL TAKE 1 TABLET(50 MG) BY MOUTH AT BEDTIME AS NEEDED FOR SLEEP  Follow-up Information    Libby Maw, MD. Schedule an appointment as soon as possible for a visit in 1 week(s).   Specialty: Family Medicine Contact information: [place] [address] [phone]          Allergies  Allergen Reactions  .  Morphine And Related Other (See Comments)    Dry heaves/ constipation  . Motrin [Ibuprofen] Other (See Comments)    Dry heaves  . Sulfa Antibiotics Other (See Comments)    Dry heaves    Consultations:  Cardiology   Procedures/Studies: X-ray Chest Pa And Lateral  Result Date: 10/16/2018 CLINICAL DATA:  85 year old male with syncope. EXAM: CHEST - 2 VIEW COMPARISON:  Chest CT dated 01/31/2018 FINDINGS: Left lung base opacity corresponds to the loculated effusion and associated left lung base atelectasis/scarring. The right lung is clear. No pneumothorax. Stable cardiac silhouette. No acute osseous pathology. IMPRESSION: Left lung base density similar to prior CT likely combination of chronic loculated effusion and associated atelectasis/scarring. No new consolidative changes. Electronically Signed   By: Anner Crete M.D.   On: 10/16/2018 02:08   Ct Head Wo Contrast  Result Date: 10/15/2018 CLINICAL DATA:  Head trauma, minor, GCS>=13, high clinical risk, initial exam; Head trauma, minor, GCS>=13, low clinical risk, initial exam Near syncope and fall at home. Dizziness. EXAM: CT HEAD WITHOUT CONTRAST CT CERVICAL SPINE WITHOUT CONTRAST TECHNIQUE: Multidetector CT imaging of the head and cervical spine was performed following the standard protocol without intravenous contrast. Multiplanar CT image reconstructions of the cervical spine were also generated. COMPARISON:  Head CT and brain MRI 01/29/2018 FINDINGS: CT HEAD FINDINGS Brain: No evidence of acute infarction, hemorrhage, hydrocephalus, extra-axial collection or mass lesion/mass effect. Generalized atrophy. Moderate chronic small vessel ischemia. Remote infarcts in the right parietal and occipital lobes, small remote left occipital infarct. Tiny remote right cerebellar infarct. Vascular: Insert vest filler Skull: No fracture or focal lesion. Sinuses/Orbits: Minor mucosal thickening of ethmoid air cells. No sinus fluid levels. Mastoid air  cells are clear. Included orbits are unremarkable. Other: None. CT CERVICAL SPINE FINDINGS Alignment: Normal. Skull base and vertebrae: No acute fracture. Vertebral body heights are maintained. The dens and skull base are intact. Soft tissues and spinal canal: No prevertebral fluid or swelling. No visible canal hematoma. Disc levels: Disc space narrowing and endplate spurring are most prominent C5-C6 and C6-C7. Multilevel facet hypertrophy. Upper chest: Carotid calcifications. Other: None. IMPRESSION: 1. No acute intracranial abnormality. No skull fracture. 2. Atrophy and chronic small vessel ischemia. Multiple remote infarcts. 3. Degenerative change in the cervical spine without acute fracture or subluxation. Electronically Signed   By: Keith Rake M.D.   On: 10/15/2018 23:17   Ct Cervical Spine Wo Contrast  Result Date: 10/15/2018 CLINICAL DATA:  Head trauma, minor, GCS>=13, high clinical risk, initial exam; Head trauma, minor, GCS>=13, low clinical risk, initial exam Near syncope and fall at home. Dizziness. EXAM: CT HEAD WITHOUT CONTRAST CT CERVICAL SPINE WITHOUT CONTRAST TECHNIQUE: Multidetector CT imaging of the head and cervical spine was performed following the standard protocol without intravenous contrast. Multiplanar CT image reconstructions of the cervical spine were also generated. COMPARISON:  Head CT and brain MRI 01/29/2018 FINDINGS: CT HEAD FINDINGS Brain: No evidence of acute infarction, hemorrhage, hydrocephalus, extra-axial collection or mass lesion/mass effect. Generalized atrophy. Moderate chronic small vessel ischemia. Remote infarcts in the right parietal and occipital lobes, small remote left occipital infarct. Tiny remote right cerebellar infarct. Vascular: Insert vest filler Skull: No fracture  or focal lesion. Sinuses/Orbits: Minor mucosal thickening of ethmoid air cells. No sinus fluid levels. Mastoid air cells are clear. Included orbits are unremarkable. Other: None. CT CERVICAL  SPINE FINDINGS Alignment: Normal. Skull base and vertebrae: No acute fracture. Vertebral body heights are maintained. The dens and skull base are intact. Soft tissues and spinal canal: No prevertebral fluid or swelling. No visible canal hematoma. Disc levels: Disc space narrowing and endplate spurring are most prominent C5-C6 and C6-C7. Multilevel facet hypertrophy. Upper chest: Carotid calcifications. Other: None. IMPRESSION: 1. No acute intracranial abnormality. No skull fracture. 2. Atrophy and chronic small vessel ischemia. Multiple remote infarcts. 3. Degenerative change in the cervical spine without acute fracture or subluxation. Electronically Signed   By: Keith Rake M.D.   On: 10/15/2018 23:17      Subjective:  Patient seen and examined the bedside this morning.  Currently hemodynamically stable.  Comfortable.  Completely alert and oriented.  Willing to be discharged home.  Discharge Exam: Vitals:   10/17/18 0700 10/17/18 1156  BP: 131/65 130/85  Pulse: (!) 58 68  Resp: 16   Temp: 97.7 F (36.5 C) 97.8 F (36.6 C)  SpO2: 97% 99%   Vitals:   10/16/18 2358 10/17/18 0308 10/17/18 0700 10/17/18 1156  BP: (!) 142/66 129/69 131/65 130/85  Pulse: 61 62 (!) 58 68  Resp: 18 16 16    Temp: 98.2 F (36.8 C) 98.2 F (36.8 C) 97.7 F (36.5 C) 97.8 F (36.6 C)  TempSrc: Oral Oral Oral Oral  SpO2: 100% 99% 97% 99%  Weight:  90.7 kg    Height:        General: [name] alert, awake, not in acute distress Cardiovascular: RRR, S1/S2 +, no rubs, no gallops Respiratory: CTA bilaterally, no wheezing, no rhonchi Abdominal: Soft, NT, ND, bowel sounds + Extremities: no edema, no cyanosis    The results of significant diagnostics from this hospitalization (including imaging, microbiology, ancillary and laboratory) are listed below for reference.     Microbiology: Recent Results (from the past 240 hour(s))  SARS Coronavirus 2 (CEPHEID - Performed in [place] hospital lab), Hosp  Order     Status: None   Collection Time: 10/16/18  1:36 AM   Specimen: Nasopharyngeal Swab  Result Value Ref Range Status   SARS Coronavirus 2 NEGATIVE NEGATIVE Final    Comment: (NOTE) If result is NEGATIVE SARS-CoV-2 target nucleic acids are NOT DETECTED. The SARS-CoV-2 RNA is generally detectable in upper and lower  respiratory specimens during the acute phase of infection. The lowest  concentration of SARS-CoV-2 viral copies this assay can detect is 250  copies / mL. A negative result does not preclude SARS-CoV-2 infection  and should not be used as the sole basis for treatment or other  patient management decisions.  A negative result may occur with  improper specimen collection / handling, submission of specimen other  than nasopharyngeal swab, presence of viral mutation(s) within the  areas targeted by this assay, and inadequate number of viral copies  (<250 copies / mL). A negative result must be combined with clinical  observations, patient history, and epidemiological information. If result is POSITIVE SARS-CoV-2 target nucleic acids are DETECTED. The SARS-CoV-2 RNA is generally detectable in upper and lower  respiratory specimens dur ing the acute phase of infection.  Positive  results are indicative of active infection with SARS-CoV-2.  Clinical  correlation with patient history and other diagnostic information is  necessary to determine patient infection status.  Positive  results do  not rule out bacterial infection or co-infection with other viruses. If result is PRESUMPTIVE POSTIVE SARS-CoV-2 nucleic acids MAY BE PRESENT.   A presumptive positive result was obtained on the submitted specimen  and confirmed on repeat testing.  While 2019 novel coronavirus  (SARS-CoV-2) nucleic acids may be present in the submitted sample  additional confirmatory testing may be necessary for epidemiological  and / or clinical management purposes  to differentiate between  SARS-CoV-2  and other Sarbecovirus currently known to infect humans.  If clinically indicated additional testing with an alternate test  methodology [phone]) is advised. The SARS-CoV-2 RNA is generally  detectable in upper and lower respiratory sp ecimens during the acute  phase of infection. The expected result is Negative. Fact Sheet for Patients:  StrictlyIdeas.no Fact Sheet for Healthcare Providers: BankingDealers.co.za This test is not yet approved or cleared by the [place] FDA and has been authorized for detection and/or diagnosis of SARS-CoV-2 by FDA under an Emergency Use Authorization (EUA).  This EUA will remain in effect (meaning this test can be used) for the duration of the COVID-19 declaration under Section 564(b)(1) of the Act, 21 U.S.C. section 360bbb-3(b)(1), unless the authorization is terminated or revoked sooner. Performed at [facility], [place] [address], [place], [place] [address]      Labs: BNP (last 3 results) No results for input(s): BNP in the last 8760 hours. Basic Metabolic Panel: Recent Labs  Lab 10/15/18 2100 10/16/18 0108  NA 140  --   K 4.2  --   CL 107  --   CO2 23  --   GLUCOSE 146*  --   BUN 22  --   CREATININE 1.35*  --   CALCIUM 9.1  --   MG  --  2.1   Liver Function Tests: No results for input(s): AST, ALT, ALKPHOS, BILITOT, PROT, ALBUMIN in the last 168 hours. No results for input(s): LIPASE, AMYLASE in the last 168 hours. No results for input(s): AMMONIA in the last 168 hours. CBC: Recent Labs  Lab 10/15/18 2100  WBC 9.0  HGB 11.7*  HCT 35.2*  MCV 102.0*  PLT 152   Cardiac Enzymes: No results for input(s): CKTOTAL, CKMB, CKMBINDEX, TROPONINI in the last 168 hours. BNP: Invalid input(s): POCBNP CBG: Recent Labs  Lab 10/15/18 2048 10/16/18 0529 10/17/18 0610  GLUCAP 129* 99 103*   D-Dimer No results for input(s): DDIMER in the last 72 hours. Hgb A1c No results for  input(s): HGBA1C in the last 72 hours. Lipid Profile No results for input(s): CHOL, HDL, LDLCALC, TRIG, CHOLHDL, LDLDIRECT in the last 72 hours. Thyroid function studies Recent Labs    10/16/18 0108  TSH 3.843   Anemia work up No results for input(s): VITAMINB12, FOLATE, FERRITIN, TIBC, IRON, RETICCTPCT in the last 72 hours. Urinalysis    Component Value Date/Time   COLORURINE YELLOW 10/15/2018 2348   APPEARANCEUR CLEAR 10/15/2018 2348   LABSPEC 1.019 10/15/2018 2348   PHURINE 7.0 10/15/2018 2348   GLUCOSEU NEGATIVE 10/15/2018 2348   GLUCOSEU NEGATIVE 03/16/2017 1353   HGBUR NEGATIVE 10/15/2018 2348   BILIRUBINUR NEGATIVE 10/15/2018 2348   KETONESUR NEGATIVE 10/15/2018 2348   PROTEINUR NEGATIVE 10/15/2018 2348   UROBILINOGEN 0.2 03/16/2017 1353   NITRITE NEGATIVE 10/15/2018 2348   LEUKOCYTESUR NEGATIVE 10/15/2018 2348   Sepsis Labs Invalid input(s): PROCALCITONIN,  WBC,  LACTICIDVEN Microbiology Recent Results (from the past 240 hour(s))  SARS Coronavirus 2 (CEPHEID - Performed in [place] hospital  lab), Hosp Order     Status: None   Collection Time: 10/16/18  1:36 AM   Specimen: Nasopharyngeal Swab  Result Value Ref Range Status   SARS Coronavirus 2 NEGATIVE NEGATIVE Final    Comment: (NOTE) If result is NEGATIVE SARS-CoV-2 target nucleic acids are NOT DETECTED. The SARS-CoV-2 RNA is generally detectable in upper and lower  respiratory specimens during the acute phase of infection. The lowest  concentration of SARS-CoV-2 viral copies this assay can detect is 250  copies / mL. A negative result does not preclude SARS-CoV-2 infection  and should not be used as the sole basis for treatment or other  patient management decisions.  A negative result may occur with  improper specimen collection / handling, submission of specimen other  than nasopharyngeal swab, presence of viral mutation(s) within the  areas targeted by this assay, and inadequate number of viral copies   (<250 copies / mL). A negative result must be combined with clinical  observations, patient history, and epidemiological information. If result is POSITIVE SARS-CoV-2 target nucleic acids are DETECTED. The SARS-CoV-2 RNA is generally detectable in upper and lower  respiratory specimens dur ing the acute phase of infection.  Positive  results are indicative of active infection with SARS-CoV-2.  Clinical  correlation with patient history and other diagnostic information is  necessary to determine patient infection status.  Positive results do  not rule out bacterial infection or co-infection with other viruses. If result is PRESUMPTIVE POSTIVE SARS-CoV-2 nucleic acids MAY BE PRESENT.   A presumptive positive result was obtained on the submitted specimen  and confirmed on repeat testing.  While 2019 novel coronavirus  (SARS-CoV-2) nucleic acids may be present in the submitted sample  additional confirmatory testing may be necessary for epidemiological  and / or clinical management purposes  to differentiate between  SARS-CoV-2 and other Sarbecovirus currently known to infect humans.  If clinically indicated additional testing with an alternate test  methodology [phone]) is advised. The SARS-CoV-2 RNA is generally  detectable in upper and lower respiratory sp ecimens during the acute  phase of infection. The expected result is Negative. Fact Sheet for Patients:  StrictlyIdeas.no Fact Sheet for Healthcare Providers: BankingDealers.co.za This test is not yet approved or cleared by the [place] FDA and has been authorized for detection and/or diagnosis of SARS-CoV-2 by FDA under an Emergency Use Authorization (EUA).  This EUA will remain in effect (meaning this test can be used) for the duration of the COVID-19 declaration under Section 564(b)(1) of the Act, 21 U.S.C. section 360bbb-3(b)(1), unless the authorization is terminated  or revoked sooner. Performed at [facility], [place] [address], [place], [place] [address]     Please note: You were cared for by a hospitalist during your hospital stay. Once you are discharged, your primary care physician will handle any further medical issues. Please note that NO REFILLS for any discharge medications will be authorized once you are discharged, as it is imperative that you return to your primary care physician (or establish a relationship with a primary care physician if you do not have one) for your post hospital discharge needs so that they can reassess your need for medications and monitor your lab values.    Time coordinating discharge: 40 minutes  SIGNED:   Shelly Coss, MD  [facility] 10/17/2018, 2:38 PM Pager [phone]  If 7PM-7AM, please contact night-coverage [email or web address] Password [email or web address]

## 2018-10-18 ENCOUNTER — Telehealth: Payer: Self-pay | Admitting: Behavioral Health

## 2018-10-18 NOTE — Telephone Encounter (Signed)
Transition Care Management Follow-up Telephone Call  PCP: Libby Maw, MD  Admit date: 10/15/2018 Discharge date: 10/17/2018  Admitted From: Home Disposition:  Home  Discharge Condition:Stable   How have you been since you were released from the hospital? Per patient's spouse, "he's feeling just fine, sitting up watching television."   Do you understand why you were in the hospital? yes, per the spouse.   Do you understand the discharge instructions? no, patient's wife voiced that he left the discharge summary at the hospital.    Where were you discharged to? Home with wife.   Items Reviewed:  Medications reviewed: yes  Allergies reviewed: yes  Dietary changes reviewed: yes, heart healthy diet.  Referrals reviewed: yes, follow-up with PCP in 1 week.   Functional Questionnaire:   Activities of Daily Living (ADLs):   He states they are independent in the following: feeding, continence and toileting States they require assistance with the following: ambulation, bathing and hygiene, grooming and dressing   Any transportation issues/concerns?: no   Any patient concerns? no   Confirmed importance and date/time of follow-up visits scheduled yes, 10/24/2018 at 11:30 AM.  Provider Appointment booked with Dr. Ethelene Hal.  Confirmed with patient if condition begins to worsen call PCP or go to the ER.  Patient was given the office number and encouraged to call back with question or concerns.  : yes

## 2018-10-22 ENCOUNTER — Other Ambulatory Visit: Payer: Self-pay | Admitting: Family Medicine

## 2018-10-24 ENCOUNTER — Encounter: Payer: Self-pay | Admitting: Family Medicine

## 2018-10-24 ENCOUNTER — Ambulatory Visit (INDEPENDENT_AMBULATORY_CARE_PROVIDER_SITE_OTHER): Payer: Medicare Other | Admitting: Family Medicine

## 2018-10-24 VITALS — Ht 70.0 in

## 2018-10-24 DIAGNOSIS — R531 Weakness: Secondary | ICD-10-CM | POA: Diagnosis not present

## 2018-10-24 DIAGNOSIS — Z09 Encounter for follow-up examination after completed treatment for conditions other than malignant neoplasm: Secondary | ICD-10-CM

## 2018-10-24 NOTE — Progress Notes (Signed)
Established Patient Office Visit  Subjective:  Patient ID: Jeremiah Mccoy, male    DOB: October 01, 1933  Age: 85 y.o. MRN: [id number]  CC:  Chief Complaint  Patient presents with  . Hospitalization Follow-up    HPI Jeremiah Mccoy presents for hospital fu sp syncopal episode at home.  Hospital course was essentially benign.  Labs EKG and orthostatics checked were all normal.  CT of the head failed to show no acute event.  Wife is present for today's interview.  She is also a patient of mine.  Patient admits that he needs to build strength in his lower extremities but is on willing to cooperate with physical therapy.  Patient is unwilling to use his walker for ambulation.  When he falls he is unable to get himself back up off the floor and EMS is often summoned to help.  Wife is unable to see his complaints.  Past Medical History:  Diagnosis Date  . Cancer ([place])   . Depression   . Diabetes mellitus without complication ([place])   . Prostate cancer ([place])   . Stroke [facility])     Past Surgical History:  Procedure Laterality Date  . APPENDECTOMY    . CHOLECYSTECTOMY    . PROSTATECTOMY      Family History  Problem Relation Age of Onset  . Colon cancer Mother   . Stroke Father   . Diabetes Brother   . Breast cancer Neg Hx   . Prostate cancer Neg Hx   . Pancreatic cancer Neg Hx     Social History   Socioeconomic History  . Marital status: Married    Spouse name: Not on file  . Number of children: Not on file  . Years of education: Not on file  . Highest education level: Not on file  Occupational History  . Occupation: retired    Comment: traffic cop for 22 years  Social Needs  . Financial resource strain: Not on file  . Food insecurity    Worry: Not on file    Inability: Not on file  . Transportation needs    Medical: Not on file    Non-medical: Not on file  Tobacco Use  . Smoking status: Never Smoker  . Smokeless tobacco: Never Used  Substance and Sexual Activity  .  Alcohol use: No    Frequency: Never  . Drug use: No  . Sexual activity: Never  Lifestyle  . Physical activity    Days per week: Not on file    Minutes per session: Not on file  . Stress: Not on file  Relationships  . Social Herbalist on phone: Not on file    Gets together: Not on file    Attends religious service: Not on file    Active member of club or organization: Not on file    Attends meetings of clubs or organizations: Not on file    Relationship status: Not on file  . Intimate partner violence    Fear of current or ex partner: Not on file    Emotionally abused: Not on file    Physically abused: Not on file    Forced sexual activity: Not on file  Other Topics Concern  . Not on file  Social History Narrative   Married for 57 years (2019). Moved to [place] in November 2018 to be closer to their daughter. Patient from [facility]. 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     Outpatient Medications Prior to  Visit  Medication Sig Dispense Refill  . aspirin EC 81 MG tablet Take 1 tablet (81 mg total) by mouth daily. 365 tablet 0  . atorvastatin (LIPITOR) 40 MG tablet TAKE 1 TABLET(40 MG) BY MOUTH DAILY AT 6 PM (Patient taking differently: Take 40 mg by mouth daily at 6 PM. ) 90 tablet 1  . metFORMIN (GLUCOPHAGE) 500 MG tablet TAKE 1 TABLET BY MOUTH TWICE DAILY WITH MEAL 60 tablet 3  . traZODone (DESYREL) 50 MG tablet TAKE 1 TABLET(50 MG) BY MOUTH AT BEDTIME AS NEEDED FOR SLEEP 30 tablet 2  . vitamin B-12 1000 MCG tablet Take 1 tablet (1,000 mcg total) by mouth daily. 30 tablet 0   No facility-administered medications prior to visit.     Allergies  Allergen Reactions  . Morphine And Related Other (See Comments)    Dry heaves/ constipation  . Motrin [Ibuprofen] Other (See Comments)    Dry heaves  . Sulfa Antibiotics Other (See Comments)    Dry heaves    ROS Review of Systems  Constitutional: Negative.   Respiratory: Negative.   Cardiovascular: Negative.    Musculoskeletal: Negative for joint swelling.  Neurological: Positive for weakness. Negative for dizziness and light-headedness.  Hematological: Does not bruise/bleed easily.  Psychiatric/Behavioral: Negative.       Objective:    Physical Exam  Constitutional: He is oriented to person, place, and time. He appears well-developed and well-nourished. No distress.  HENT:  Head: Normocephalic and atraumatic.  Right Ear: External ear normal.  Left Ear: External ear normal.  Eyes: Conjunctivae are normal. Right eye exhibits no discharge. Left eye exhibits no discharge. No scleral icterus.  Neck: No JVD present. No tracheal deviation present.  Pulmonary/Chest: Effort normal. No stridor.  Neurological: He is alert and oriented to person, place, and time.  Skin: He is not diaphoretic.  Psychiatric: He has a normal mood and affect. His behavior is normal.    Ht 5\' 10"  (1.778 m)   BMI 28.70 kg/m  Wt Readings from Last 3 Encounters:  10/17/18 200 lb (90.7 kg)  08/06/18 195 lb 8 oz (88.7 kg)  05/24/18 203 lb (92.1 kg)   BP Readings from Last 3 Encounters:  10/17/18 130/85  05/24/18 127/79  05/07/18 126/80   Guideline developer:  UpToDate (see UpToDate for funding source) Date Released: June 2014  Health Maintenance Due  Topic Date Due  . FOOT EXAM  05/26/1945  . OPHTHALMOLOGY EXAM  05/26/1945  . TETANUS/TDAP  05/27/1954  . URINE MICROALBUMIN  03/16/2018    There are no preventive care reminders to display for this patient.  Lab Results  Component Value Date   TSH 3.843 10/16/2018   Lab Results  Component Value Date   WBC 9.0 10/15/2018   HGB 11.7 (L) 10/15/2018   HCT 35.2 (L) 10/15/2018   MCV 102.0 (H) 10/15/2018   PLT 152 10/15/2018   Lab Results  Component Value Date   NA 140 10/15/2018   K 4.2 10/15/2018   CO2 23 10/15/2018   GLUCOSE 146 (H) 10/15/2018   BUN 22 10/15/2018   CREATININE 1.35 (H) 10/15/2018   BILITOT 0.4 05/07/2018   ALKPHOS 70 05/07/2018    AST 23 05/07/2018   ALT 39 05/07/2018   PROT 6.5 05/07/2018   ALBUMIN 4.0 05/07/2018   CALCIUM 9.1 10/15/2018   ANIONGAP 10 10/15/2018   GFR 55.65 (L) 08/09/2018   Lab Results  Component Value Date   CHOL 178 01/05/2018   Lab Results  Component  Value Date   HDL 38 (L) 01/05/2018   Lab Results  Component Value Date   LDLCALC 112 (H) 01/05/2018   Lab Results  Component Value Date   TRIG 142 01/05/2018   Lab Results  Component Value Date   CHOLHDL 4.7 01/05/2018   Lab Results  Component Value Date   HGBA1C 7.5 (H) 08/09/2018      Assessment & Plan:   Problem List Items Addressed This Visit      Other   Hospital discharge follow-up - Primary      No orders of the defined types were placed in this encounter.   Follow-up: Return in about 1 month (around 11/24/2018).   Encourage patient to use a walker for all ambulation.  Encouraged him to work with physical therapy as ordered by the hospitalist.  [place] him to stay hydrated.  Take all medicines as directed.  Follow-up with me in 1 month.  Virtual Visit via Video Note  I connected with [name] on 10/24/18 at 11:30 AM EDT by a video enabled telemedicine application and verified that I am speaking with the correct person using two identifiers.  Location: Patient: home Provider:    I discussed the limitations of evaluation and management by telemedicine and the availability of in person appointments. The patient expressed understanding and agreed to proceed.  History of Present Illness:    Observations/Objective:   Assessment and Plan:   Follow Up Instructions:    I discussed the assessment and treatment plan with the patient. The patient was provided an opportunity to ask questions and all were answered. The patient agreed with the plan and demonstrated an understanding of the instructions.   The patient was advised to call back or seek an in-person evaluation if the symptoms worsen or if the  condition fails to improve as anticipated.  I provided 25 minutes of non-face-to-face time during this encounter.   Libby Maw, MD

## 2018-10-26 ENCOUNTER — Telehealth: Payer: Self-pay | Admitting: Radiology

## 2018-10-26 NOTE — Telephone Encounter (Signed)
Enrolled patient for a 30 day [employer] Event monitor to be mailed. Brief instructions were gone over with patient and his wife and they know to expect the monitor to arrive in 3-4 days.

## 2018-11-05 ENCOUNTER — Encounter: Payer: Self-pay | Admitting: Family Medicine

## 2018-11-05 ENCOUNTER — Ambulatory Visit (INDEPENDENT_AMBULATORY_CARE_PROVIDER_SITE_OTHER): Payer: Medicare Other | Admitting: Family Medicine

## 2018-11-05 VITALS — BP 132/80 | HR 115 | Ht 70.0 in | Wt 198.2 lb

## 2018-11-05 DIAGNOSIS — Z794 Long term (current) use of insulin: Secondary | ICD-10-CM

## 2018-11-05 DIAGNOSIS — E118 Type 2 diabetes mellitus with unspecified complications: Secondary | ICD-10-CM | POA: Diagnosis not present

## 2018-11-05 DIAGNOSIS — Z09 Encounter for follow-up examination after completed treatment for conditions other than malignant neoplasm: Secondary | ICD-10-CM | POA: Diagnosis not present

## 2018-11-05 DIAGNOSIS — N183 Chronic kidney disease, stage 3 unspecified: Secondary | ICD-10-CM

## 2018-11-05 DIAGNOSIS — D539 Nutritional anemia, unspecified: Secondary | ICD-10-CM

## 2018-11-05 LAB — BASIC METABOLIC PANEL
BUN: 24 mg/dL — ABNORMAL HIGH (ref 6–23)
CO2: 25 mEq/L (ref 19–32)
Calcium: 9.7 mg/dL (ref 8.4–10.5)
Chloride: 105 mEq/L (ref 96–112)
Creatinine, Ser: 1.2 mg/dL (ref 0.40–1.50)
GFR: 57.76 mL/min — ABNORMAL LOW (ref >60.00)
Glucose, Bld: 144 mg/dL — ABNORMAL HIGH (ref 70–99)
Potassium: 4.8 mEq/L (ref 3.5–5.1)
Sodium: 140 mEq/L (ref 135–145)

## 2018-11-05 LAB — CBC
HCT: 36.9 % — ABNORMAL LOW (ref 39.0–52.0)
Hemoglobin: 12.3 g/dL — ABNORMAL LOW (ref 13.0–17.0)
MCHC: 33.3 g/dL (ref 30.0–36.0)
MCV: 102.3 fl — ABNORMAL HIGH (ref 78.0–100.0)
Platelets: 160 10*3/uL (ref 150.0–400.0)
RBC: 3.6 Mil/uL — ABNORMAL LOW (ref 4.22–5.81)
RDW: 13.9 % (ref 11.5–15.5)
WBC: 8.9 10*3/uL (ref 4.0–10.5)

## 2018-11-05 LAB — HEMOGLOBIN A1C: Hgb A1c MFr Bld: 6.9 % — ABNORMAL HIGH (ref 4.6–6.5)

## 2018-11-05 NOTE — Progress Notes (Signed)
Established Patient Office Visit  Subjective:  Patient ID: Jeremiah Mccoy, male    DOB: [birth year]  Age: 85 y.o. MRN: [id number]  CC:  Chief Complaint  Patient presents with  . Follow-up    HPI Jeremiah Mccoy presents for hospital discharge follow-up status post syncopal episode.  Home health was advised on discharge but patient refused.  His wife says that he is actually been compliant with his medications.  Continues to lead a quite sedentary lifestyle.  No further syncopal episodes have been witnessed status post hospital discharge.  Past Medical History:  Diagnosis Date  . Cancer ([place])   . Depression   . Diabetes mellitus without complication ([place])   . Prostate cancer ([employer])   . Stroke [facility])     Past Surgical History:  Procedure Laterality Date  . APPENDECTOMY    . CHOLECYSTECTOMY    . PROSTATECTOMY      Family History  Problem Relation Age of Onset  . Colon cancer Mother   . Stroke Father   . Diabetes Brother   . Breast cancer Neg Hx   . Prostate cancer Neg Hx   . Pancreatic cancer Neg Hx     Social History   Socioeconomic History  . Marital status: Married    Spouse name: Not on file  . Number of children: Not on file  . Years of education: Not on file  . Highest education level: Not on file  Occupational History  . Occupation: retired    Comment: traffic cop for 22 years  Social Needs  . Financial resource strain: Not on file  . Food insecurity    Worry: Not on file    Inability: Not on file  . Transportation needs    Medical: Not on file    Non-medical: Not on file  Tobacco Use  . Smoking status: Never Smoker  . Smokeless tobacco: Never Used  Substance and Sexual Activity  . Alcohol use: No    Frequency: Never  . Drug use: No  . Sexual activity: Never  Lifestyle  . Physical activity    Days per week: Not on file    Minutes per session: Not on file  . Stress: Not on file  Relationships  . Social Herbalist on phone: Not on  file    Gets together: Not on file    Attends religious service: Not on file    Active member of club or organization: Not on file    Attends meetings of clubs or organizations: Not on file    Relationship status: Not on file  . Intimate partner violence    Fear of current or ex partner: Not on file    Emotionally abused: Not on file    Physically abused: Not on file    Forced sexual activity: Not on file  Other Topics Concern  . Not on file  Social History Narrative   Married for 57 years (2019). Moved to [place] in November 2018 to be closer to their daughter. Patient from [facility]. 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     Outpatient Medications Prior to Visit  Medication Sig Dispense Refill  . aspirin EC 81 MG tablet Take 1 tablet (81 mg total) by mouth daily. 365 tablet 0  . atorvastatin (LIPITOR) 40 MG tablet TAKE 1 TABLET(40 MG) BY MOUTH DAILY AT 6 PM (Patient taking differently: Take 40 mg by mouth daily at 6 PM. ) 90 tablet 1  . metFORMIN (  GLUCOPHAGE) 500 MG tablet TAKE 1 TABLET BY MOUTH TWICE DAILY WITH MEAL 60 tablet 3  . traZODone (DESYREL) 50 MG tablet TAKE 1 TABLET(50 MG) BY MOUTH AT BEDTIME AS NEEDED FOR SLEEP 30 tablet 2  . vitamin B-12 1000 MCG tablet Take 1 tablet (1,000 mcg total) by mouth daily. 30 tablet 0   No facility-administered medications prior to visit.     Allergies  Allergen Reactions  . Morphine And Related Other (See Comments)    Dry heaves/ constipation  . Motrin [Ibuprofen] Other (See Comments)    Dry heaves  . Sulfa Antibiotics Other (See Comments)    Dry heaves    ROS Review of Systems  Constitutional: Negative.   Respiratory: Negative.   Cardiovascular: Negative.   Gastrointestinal: Negative.   Musculoskeletal: Positive for gait problem.  Skin: Negative.   Neurological: Negative for dizziness and light-headedness.  Psychiatric/Behavioral: Negative.       Objective:    Physical Exam  Constitutional: He is oriented to  person, place, and time. He appears well-developed and well-nourished. No distress.  HENT:  Head: Normocephalic and atraumatic.  Right Ear: External ear normal.  Left Ear: External ear normal.  Eyes: Right eye exhibits no discharge. Left eye exhibits no discharge. No scleral icterus.  Neck: No JVD present. No tracheal deviation present.  Cardiovascular: Normal rate, regular rhythm and normal heart sounds.  Pulmonary/Chest: Effort normal and breath sounds normal. No stridor.  Musculoskeletal:        General: No edema.  Neurological: He is alert and oriented to person, place, and time.  Skin: Skin is dry. He is not diaphoretic.  Psychiatric: He has a normal mood and affect. His behavior is normal.    BP 132/80   Pulse (!) 115   Ht 5\' 10"  (1.778 m)   Wt 198 lb 4 oz (89.9 kg)   SpO2 95%   BMI 28.45 kg/m  Wt Readings from Last 3 Encounters:  11/05/18 198 lb 4 oz (89.9 kg)  10/17/18 200 lb (90.7 kg)  08/06/18 195 lb 8 oz (88.7 kg)   BP Readings from Last 3 Encounters:  11/05/18 132/80  10/17/18 130/85  05/24/18 127/79   Guideline developer:  UpToDate (see UpToDate for funding source) Date Released: June 2014  Health Maintenance Due  Topic Date Due  . FOOT EXAM  05/26/1945  . OPHTHALMOLOGY EXAM  05/26/1945  . TETANUS/TDAP  05/27/1954  . URINE MICROALBUMIN  03/16/2018  . INFLUENZA VACCINE  10/27/2018    There are no preventive care reminders to display for this patient.  Lab Results  Component Value Date   TSH 3.843 10/16/2018   Lab Results  Component Value Date   WBC 9.0 10/15/2018   HGB 11.7 (L) 10/15/2018   HCT 35.2 (L) 10/15/2018   MCV 102.0 (H) 10/15/2018   PLT 152 10/15/2018   Lab Results  Component Value Date   NA 140 10/15/2018   K 4.2 10/15/2018   CO2 23 10/15/2018   GLUCOSE 146 (H) 10/15/2018   BUN 22 10/15/2018   CREATININE 1.35 (H) 10/15/2018   BILITOT 0.4 05/07/2018   ALKPHOS 70 05/07/2018   AST 23 05/07/2018   ALT 39 05/07/2018   PROT 6.5  05/07/2018   ALBUMIN 4.0 05/07/2018   CALCIUM 9.1 10/15/2018   ANIONGAP 10 10/15/2018   GFR 55.65 (L) 08/09/2018   Lab Results  Component Value Date   CHOL 178 01/05/2018   Lab Results  Component Value Date   HDL 38 (  L) 01/05/2018   Lab Results  Component Value Date   LDLCALC 112 (H) 01/05/2018   Lab Results  Component Value Date   TRIG 142 01/05/2018   Lab Results  Component Value Date   CHOLHDL 4.7 01/05/2018   Lab Results  Component Value Date   HGBA1C 7.5 (H) 08/09/2018      Assessment & Plan:   Problem List Items Addressed This Visit      Endocrine   Type 2 diabetes mellitus with complication, with long-term current use of insulin (HCC) (Chronic)   Relevant Orders   Basic metabolic panel   Hemoglobin A1c     Genitourinary   CKD (chronic kidney disease), stage III (HCC) (Chronic)   Relevant Orders   CBC   Basic metabolic panel     Other   Hospital discharge follow-up - Primary    Other Visit Diagnoses    Macrocytic anemia       Relevant Orders   Vitamin B12      No orders of the defined types were placed in this encounter.   Follow-up: Return in about 3 months (around 02/05/2019).   Discussed using an antidepressant and patient declines that therapy for now.  Encouraged him to continue his medicines as directed to avoid future hospitalizations.

## 2018-11-07 ENCOUNTER — Ambulatory Visit: Payer: Medicare Other | Admitting: Family Medicine

## 2018-11-27 ENCOUNTER — Telehealth: Payer: Self-pay | Admitting: Family Medicine

## 2018-11-27 NOTE — Telephone Encounter (Signed)

## 2018-11-28 ENCOUNTER — Encounter: Payer: Self-pay | Admitting: Family Medicine

## 2018-11-28 ENCOUNTER — Other Ambulatory Visit: Payer: Self-pay

## 2018-11-28 ENCOUNTER — Ambulatory Visit (INDEPENDENT_AMBULATORY_CARE_PROVIDER_SITE_OTHER): Payer: Medicare Other

## 2018-11-28 DIAGNOSIS — Z23 Encounter for immunization: Secondary | ICD-10-CM | POA: Diagnosis not present

## 2018-11-28 NOTE — Progress Notes (Signed)
Pt came into the office to receive his flu shot. Vaccine given in the right deltoid and pt tolerated well. No signs/symptoms of a reaction prior to leaving. VIS given to pt.

## 2019-01-01 ENCOUNTER — Ambulatory Visit (INDEPENDENT_AMBULATORY_CARE_PROVIDER_SITE_OTHER): Payer: Medicare Other | Admitting: Podiatry

## 2019-01-01 ENCOUNTER — Encounter: Payer: Self-pay | Admitting: Podiatry

## 2019-01-01 ENCOUNTER — Other Ambulatory Visit: Payer: Self-pay

## 2019-01-01 DIAGNOSIS — B351 Tinea unguium: Secondary | ICD-10-CM | POA: Diagnosis not present

## 2019-01-01 DIAGNOSIS — M79675 Pain in left toe(s): Secondary | ICD-10-CM | POA: Diagnosis not present

## 2019-01-01 DIAGNOSIS — E119 Type 2 diabetes mellitus without complications: Secondary | ICD-10-CM

## 2019-01-01 DIAGNOSIS — M79674 Pain in right toe(s): Secondary | ICD-10-CM

## 2019-01-01 DIAGNOSIS — E1151 Type 2 diabetes mellitus with diabetic peripheral angiopathy without gangrene: Secondary | ICD-10-CM

## 2019-01-01 NOTE — Patient Instructions (Signed)
Diabetes Mellitus and Foot Care Foot care is an important part of your health, especially when you have diabetes. Diabetes may cause you to have problems because of poor blood flow (circulation) to your feet and legs, which can cause your skin to:  Become thinner and drier.  Break more easily.  Heal more slowly.  Peel and crack. You may also have nerve damage (neuropathy) in your legs and feet, causing decreased feeling in them. This means that you may not notice minor injuries to your feet that could lead to more serious problems. Noticing and addressing any potential problems early is the best way to prevent future foot problems. How to care for your feet Foot hygiene  Wash your feet daily with warm water and mild soap. Do not use hot water. Then, pat your feet and the areas between your toes until they are completely dry. Do not soak your feet as this can dry your skin.  Trim your toenails straight across. Do not dig under them or around the cuticle. File the edges of your nails with an emery board or nail file.  Apply a moisturizing lotion or petroleum jelly to the skin on your feet and to dry, brittle toenails. Use lotion that does not contain alcohol and is unscented. Do not apply lotion between your toes. Shoes and socks  Wear clean socks or stockings every day. Make sure they are not too tight. Do not wear knee-high stockings since they may decrease blood flow to your legs.  Wear shoes that fit properly and have enough cushioning. Always look in your shoes before you put them on to be sure there are no objects inside.  To break in new shoes, wear them for just a few hours a day. This prevents injuries on your feet. Wounds, scrapes, corns, and calluses  Check your feet daily for blisters, cuts, bruises, sores, and redness. If you cannot see the bottom of your feet, use a mirror or ask someone for help.  Do not cut corns or calluses or try to remove them with medicine.  If you  find a minor scrape, cut, or break in the skin on your feet, keep it and the skin around it clean and dry. You may clean these areas with mild soap and water. Do not clean the area with peroxide, alcohol, or iodine.  If you have a wound, scrape, corn, or callus on your foot, look at it several times a day to make sure it is healing and not infected. Check for: ? Redness, swelling, or pain. ? Fluid or blood. ? Warmth. ? Pus or a bad smell. General instructions  Do not cross your legs. This may decrease blood flow to your feet.  Do not use heating pads or hot water bottles on your feet. They may burn your skin. If you have lost feeling in your feet or legs, you may not know this is happening until it is too late.  Protect your feet from hot and cold by wearing shoes, such as at the beach or on hot pavement.  Schedule a complete foot exam at least once a year (annually) or more often if you have foot problems. If you have foot problems, report any cuts, sores, or bruises to your health care provider immediately. Contact a health care provider if:  You have a medical condition that increases your risk of infection and you have any cuts, sores, or bruises on your feet.  You have an injury that is not   healing.  You have redness on your legs or feet.  You feel burning or tingling in your legs or feet.  You have pain or cramps in your legs and feet.  Your legs or feet are numb.  Your feet always feel cold.  You have pain around a toenail. Get help right away if:  You have a wound, scrape, corn, or callus on your foot and: ? You have pain, swelling, or redness that gets worse. ? You have fluid or blood coming from the wound, scrape, corn, or callus. ? Your wound, scrape, corn, or callus feels warm to the touch. ? You have pus or a bad smell coming from the wound, scrape, corn, or callus. ? You have a fever. ? You have a red line going up your leg. Summary  Check your feet every day  for cuts, sores, red spots, swelling, and blisters.  Moisturize feet and legs daily.  Wear shoes that fit properly and have enough cushioning.  If you have foot problems, report any cuts, sores, or bruises to your health care provider immediately.  Schedule a complete foot exam at least once a year (annually) or more often if you have foot problems. This information is not intended to replace advice given to you by your health care provider. Make sure you discuss any questions you have with your health care provider. Document Released: 03/11/2000 Document Revised: 04/26/2017 Document Reviewed: 04/15/2016 Elsevier Patient Education  2020 Else[employer] Inc[employer]   Onychomycosis/Fungal Toenails  WHAT IS IT? An infection that lies within the keratin of your nail plate that is caused by a fungus.  WHY ME? Fungal infections affect all ages, sexes, races, and creeds.  There may be many factors that predispose you to a fungal infection such as age, coexisting medical conditions such as diabetes, or an autoimmune disease; stress, medications, fatigue, genetics, etc.  Bottom line: fungus thrives in a warm, moist environment and your shoes offer such a location.  IS IT CONTAGIOUS? Theoretically, yes.  You do not want to share shoes, nail clippers or files with someone who has fungal toenails.  Walking around barefoot in the same room or sleeping in the same bed is unlikely to transfer the organism.  It is important to realize, however, that fungus can spread easily from one nail to the next on the same foot.  HOW DO WE TREAT THIS?  There are several ways to treat this condition.  Treatment may depend on many factors such as age, medications, pregnancy, liver and kidney conditions, etc.  It is best to ask your doctor which options are available to you.  1. No treatment.   Unlike many other medical concerns, you can live with this condition.  However for many people this can be a painful condition and may lead to  ingrown toenails or a bacterial infection.  It is recommended that you keep the nails cut short to help reduce the amount of fungal nail. 2. Topical treatment.  These range from herbal remedies to prescription strength nail lacquers.  About 40-50% effective, topicals require twice daily application for approximately 9 to 12 months or until an entirely new nail has grown out.  The most effective topicals are medical grade medications available through physicians offices. 3. Oral antifungal medications.  With an 80-90% cure rate, the most common oral medication requires 3 to 4 months of therapy and stays in your system for a year as the new nail grows out.  Oral antifungal medications do require   blood work to make sure it is a safe drug for you.  A liver function panel will be performed prior to starting the medication and after the first month of treatment.  It is important to have the blood work performed to avoid any harmful side effects.  In general, this medication safe but blood work is required. 4. Laser Therapy.  This treatment is performed by applying a specialized laser to the affected nail plate.  This therapy is noninvasive, fast, and non-painful.  It is not covered by insurance and is therefore, out of pocket.  The results have been very good with a 80-95% cure rate.  The [facility] is the only practice in the area to offer this therapy. 5. Permanent Nail Avulsion.  Removing the entire nail so that a new nail will not grow back. 

## 2019-01-06 NOTE — Progress Notes (Signed)
Subjective:  Jeremiah Mccoy presents to clinic for preventative diabetic foot care today with cc of  painful, thick, discolored, elongated toenails 1-5 b/l that become tender and cannot cut because of thickness. Pain is aggravated when wearing enclosed shoe gear. He denies any new pedal problems on today's visit.   Libby Maw, MD is his PCP.   Current Outpatient Medications on File Prior to Visit  Medication Sig Dispense Refill  . aspirin EC 81 MG tablet Take 1 tablet (81 mg total) by mouth daily. 365 tablet 0  . atorvastatin (LIPITOR) 40 MG tablet TAKE 1 TABLET(40 MG) BY MOUTH DAILY AT 6 PM (Patient taking differently: Take 40 mg by mouth daily at 6 PM. ) 90 tablet 1  . metFORMIN (GLUCOPHAGE) 500 MG tablet TAKE 1 TABLET BY MOUTH TWICE DAILY WITH MEAL 60 tablet 3  . traZODone (DESYREL) 50 MG tablet TAKE 1 TABLET(50 MG) BY MOUTH AT BEDTIME AS NEEDED FOR SLEEP 30 tablet 2  . vitamin B-12 1000 MCG tablet Take 1 tablet (1,000 mcg total) by mouth daily. 30 tablet 0   No current facility-administered medications on file prior to visit.      Allergies  Allergen Reactions  . Morphine And Related Other (See Comments)    Dry heaves/ constipation  . Motrin [Ibuprofen] Other (See Comments)    Dry heaves  . Sulfa Antibiotics Other (See Comments)    Dry heaves    Objective: Physical Examination:  Vascular Examination: Capillary refill time immediate x 10 digits.  DP pulses faintly palpable b/l.  PT pulses nonpalpable b/l.  Digital hair diminished b/l.  No edema noted b/l.  Skin temperature gradient warm to cool b/l.  Dermatological Examination: Skin with normal turgor, texture and tone b/l.  No open wounds b/l.  Elongated, thick, discolored brittle toenails with subungual debris and pain on dorsal palpation of nailbeds 1-5 b/l.  Musculoskeletal Examination: Muscle strength 5/5 to all muscle groups b/l.  No pain, crepitus or joint discomfort with active/passive  ROM.  Neurological Examination: Sensation intact 5/5 b/l with 10 gram monofilament.  Vibratory sensation intact b/l.  Assessment: Mycotic nail infection with pain 1-5 b/l Encounter for diabetic foot exam NIDDM with PAD  Plan: 1. Toenails 1-5 b/l were debrided in length and girth without iatrogenic laceration. 2.  Continue soft, supportive shoe gear daily. 3.  Report any pedal injuries to medical professional. 4.  Follow up 3 months. 5.  Patient/POA to call should there be a question/concern in there interim.

## 2019-01-07 ENCOUNTER — Other Ambulatory Visit: Payer: Self-pay | Admitting: Family Medicine

## 2019-01-07 DIAGNOSIS — Z8673 Personal history of transient ischemic attack (TIA), and cerebral infarction without residual deficits: Secondary | ICD-10-CM

## 2019-01-07 DIAGNOSIS — E785 Hyperlipidemia, unspecified: Secondary | ICD-10-CM

## 2019-01-15 ENCOUNTER — Other Ambulatory Visit: Payer: Self-pay | Admitting: Family Medicine

## 2019-02-05 ENCOUNTER — Encounter: Payer: Self-pay | Admitting: Family Medicine

## 2019-02-05 ENCOUNTER — Ambulatory Visit (INDEPENDENT_AMBULATORY_CARE_PROVIDER_SITE_OTHER): Payer: Medicare Other | Admitting: Family Medicine

## 2019-02-05 ENCOUNTER — Other Ambulatory Visit: Payer: Self-pay

## 2019-02-05 VITALS — BP 128/68 | HR 84 | Temp 98.5°F | Wt 193.8 lb

## 2019-02-05 DIAGNOSIS — E1121 Type 2 diabetes mellitus with diabetic nephropathy: Secondary | ICD-10-CM

## 2019-02-05 DIAGNOSIS — Z23 Encounter for immunization: Secondary | ICD-10-CM

## 2019-02-05 DIAGNOSIS — D539 Nutritional anemia, unspecified: Secondary | ICD-10-CM

## 2019-02-05 DIAGNOSIS — Z022 Encounter for examination for admission to residential institution: Secondary | ICD-10-CM

## 2019-02-05 LAB — COMPREHENSIVE METABOLIC PANEL
ALT: 34 U/L (ref 0–53)
AST: 23 U/L (ref 0–37)
Albumin: 3.8 g/dL (ref 3.5–5.2)
Alkaline Phosphatase: 75 U/L (ref 39–117)
BUN: 22 mg/dL (ref 6–23)
CO2: 26 mEq/L (ref 19–32)
Calcium: 9.1 mg/dL (ref 8.4–10.5)
Chloride: 104 mEq/L (ref 96–112)
Creatinine, Ser: 1.07 mg/dL (ref 0.40–1.50)
GFR: 65.89 mL/min (ref >60.00)
Glucose, Bld: 166 mg/dL — ABNORMAL HIGH (ref 70–99)
Potassium: 4.3 mEq/L (ref 3.5–5.1)
Sodium: 140 mEq/L (ref 135–145)
Total Bilirubin: 0.3 mg/dL (ref 0.2–1.2)
Total Protein: 6.5 g/dL (ref 6.0–8.3)

## 2019-02-05 LAB — B12 AND FOLATE PANEL
Folate: 8.6 ng/mL (ref >5.9)
Vitamin B-12: 788 pg/mL (ref 211–911)

## 2019-02-05 LAB — HEMOGLOBIN A1C: Hgb A1c MFr Bld: 6.7 % — ABNORMAL HIGH (ref 4.6–6.5)

## 2019-02-05 NOTE — Progress Notes (Signed)
Established Patient Office Visit  Subjective:  Patient ID: Jeremiah Mccoy, male    DOB: [birth year]  Age: 85 y.o. MRN: [id number]  CC:  Chief Complaint  Patient presents with  . Follow-up    [name] here today for a 75-monthF/U.  States needs a Quantiferon test? He is in agreement with getting the PNV-23 vaccine    HPI RJalene Lackopresents for follow-up of his diabetes, B12 deficiency and physical for placement in a long-term care facility.  He will go to assisted living while his wife convalesces after her upcoming TKR on the right.  He may stay beyond that period of time.  Continues to take his medicines as far as his wife knows.  She is controlling his diet and his diabetes has traditionally been under good control.  Continues to ambulate with a cane.  Continues to supplement with 1000 mcg of B12.  He is due for an eye check in January.  Past Medical History:  Diagnosis Date  . Cancer ([facility]   . Depression   . Diabetes mellitus without complication ([facility]   . Prostate cancer ([facility]   . Stroke ([facility]     Past Surgical History:  Procedure Laterality Date  . APPENDECTOMY    . CHOLECYSTECTOMY    . PROSTATECTOMY      Family History  Problem Relation Age of Onset  . Colon cancer Mother   . Stroke Father   . Diabetes Brother   . Breast cancer Neg Hx   . Prostate cancer Neg Hx   . Pancreatic cancer Neg Hx     Social History   Socioeconomic History  . Marital status: Married    Spouse name: Not on file  . Number of children: Not on file  . Years of education: Not on file  . Highest education level: Not on file  Occupational History  . Occupation: retired    Comment: traffic cop for 22 years  Social Needs  . Financial resource strain: Not on file  . Food insecurity    Worry: Not on file    Inability: Not on file  . Transportation needs    Medical: Not on file    Non-medical: Not on file  Tobacco Use  . Smoking status: Never Smoker  . Smokeless tobacco: Never Used   Substance and Sexual Activity  . Alcohol use: No    Frequency: Never  . Drug use: No  . Sexual activity: Never  Lifestyle  . Physical activity    Days per week: Not on file    Minutes per session: Not on file  . Stress: Not on file  Relationships  . Social [place] phone: Not on file    Gets together: Not on file    Attends religious service: Not on file    Active member of club or organization: Not on file    Attends meetings of clubs or organizations: Not on file    Relationship status: Not on file  . Intimate partner violence    Fear of current or ex partner: Not on file    Emotionally abused: Not on file    Physically abused: Not on file    Forced sexual activity: Not on file  Other Topics Concern  . Not on file  Social History Narrative   Married for 57 years (2019). [place] 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     Outpatient Medications Prior to Visit  Medication Sig Dispense Refill  . aspirin EC 81 MG tablet Take 1 tablet (81 mg total) by mouth daily. 365 tablet 0  . atorvastatin (LIPITOR) 40 MG tablet TAKE 1 TABLET(40 MG) BY MOUTH DAILY AT 6 PM 90 tablet 1  . metFORMIN (GLUCOPHAGE) 500 MG tablet TAKE 1 TABLET BY MOUTH TWICE DAILY WITH MEAL 60 tablet 3  . traZODone (DESYREL) 50 MG tablet TAKE 1 TABLET(50 MG) BY MOUTH AT BEDTIME AS NEEDED FOR SLEEP 30 tablet 2  . vitamin B-12 1000 MCG tablet Take 1 tablet (1,000 mcg total) by mouth daily. 30 tablet 0   No facility-administered medications prior to visit.     Allergies  Allergen Reactions  . Morphine And Related Other (See Comments)    Dry heaves/ constipation  . Motrin [Ibuprofen] Other (See Comments)    Dry heaves  . Sulfa Antibiotics Other (See Comments)    Dry heaves    ROS Review of Systems  Constitutional: Negative.   HENT: Negative.   Eyes: Negative for photophobia and visual disturbance.   Respiratory: Negative.   Cardiovascular: Negative.   Gastrointestinal: Negative.   Endocrine: Negative for polyphagia and polyuria.  Genitourinary: Negative.   Musculoskeletal: Positive for gait problem.  Skin: Negative for pallor and rash.  Allergic/Immunologic: Negative for immunocompromised state.  Neurological: Negative for light-headedness and headaches.  Hematological: Negative for adenopathy.  Psychiatric/Behavioral: Negative.       Objective:    Physical Exam  Constitutional: He is oriented to person, place, and time. He appears well-developed and well-nourished. No distress.  HENT:  Head: Normocephalic and atraumatic.  Right Ear: External ear normal.  Left Ear: External ear normal.  Eyes: Conjunctivae are normal. Right eye exhibits no discharge. Left eye exhibits no discharge. No scleral icterus.  Neck: No JVD present. No tracheal deviation present.  Cardiovascular: Normal rate, regular rhythm and normal heart sounds.  Pulmonary/Chest: Effort normal and breath sounds normal. No stridor.  Neurological: He is alert and oriented to person, place, and time.  Skin: Skin is warm and dry. He is not diaphoretic.  Psychiatric: He has a normal mood and affect. His behavior is normal.    BP 128/68 (BP Location: Right Arm, Patient Position: Sitting, Cuff Size: Normal)   Pulse 84   Temp 98.5 F (36.9 C) (Oral)   Wt 193 lb 12.8 oz (87.9 kg)   SpO2 93%   BMI 27.81 kg/m  Wt Readings from Last 3 Encounters:  02/05/19 193 lb 12.8 oz (87.9 kg)  11/05/18 198 lb 4 oz (89.9 kg)  10/17/18 200 lb (90.7 kg)     Health Maintenance Due  Topic Date Due  . OPHTHALMOLOGY EXAM  05/26/1943  . URINE MICROALBUMIN  03/16/2018  . PNA vac Low Risk Adult (2 of 2 - PPSV23) 12/08/2018    There are no preventive care reminders to display for this patient.  Lab Results  Component Value Date   TSH 3.843 10/16/2018   Lab Results  Component Value Date   WBC 8.9 11/05/2018   HGB 12.3 (L)  11/05/2018   HCT 36.9 (L) 11/05/2018   MCV 102.3 (H) 11/05/2018   PLT 160.0 11/05/2018   Lab Results  Component Value Date   NA 140 11/05/2018   K 4.8 11/05/2018   CO2 25 11/05/2018   GLUCOSE 144 (H) 11/05/2018   BUN 24 (H) 11/05/2018   CREATININE 1.20 11/05/2018   BILITOT 0.4 05/07/2018  ALKPHOS 70 05/07/2018   AST 23 05/07/2018   ALT 39 05/07/2018   PROT 6.5 05/07/2018   ALBUMIN 4.0 05/07/2018   CALCIUM 9.7 11/05/2018   ANIONGAP 10 10/15/2018   GFR 57.76 (L) 11/05/2018   Lab Results  Component Value Date   CHOL 178 01/05/2018   Lab Results  Component Value Date   HDL 38 (L) 01/05/2018   Lab Results  Component Value Date   LDLCALC 112 (H) 01/05/2018   Lab Results  Component Value Date   TRIG 142 01/05/2018   Lab Results  Component Value Date   CHOLHDL 4.7 01/05/2018   Lab Results  Component Value Date   HGBA1C 6.9 (H) 11/05/2018      Assessment & Plan:   Problem List Items Addressed This Visit      Endocrine   Type II diabetes mellitus with renal manifestations (HCC)   Relevant Orders   Comp Met (CMET)   HgB A1c    Other Visit Diagnoses    Encounter for examination for admission to assisted living facility    -  Primary   Relevant Orders   QuantiFERON-TB Gold Plus   Need for pneumococcal vaccination       Relevant Orders   Pneumococcal polysaccharide vaccine 23-valent greater than or equal to 2yo subcutaneous/IM (Completed)   Macrocytic anemia       Relevant Orders   B12 and Folate Panel      No orders of the defined types were placed in this encounter.   Follow-up: Return in about 3 months (around 05/08/2019).    Libby Maw, MD

## 2019-02-08 LAB — QUANTIFERON-TB GOLD PLUS
Mitogen-NIL: >10 IU/mL
NIL: 0.02 IU/mL
QuantiFERON-TB Gold Plus: NEGATIVE
TB1-NIL: 0 IU/mL
TB2-NIL: 0 IU/mL

## 2019-02-09 DIAGNOSIS — Z1159 Encounter for screening for other viral diseases: Secondary | ICD-10-CM | POA: Diagnosis not present

## 2019-02-18 ENCOUNTER — Emergency Department (HOSPITAL_COMMUNITY)
Admission: EM | Admit: 2019-02-18 | Discharge: 2019-02-19 | Disposition: A | Discharge status: Home / Self Care | Payer: Medicare Other | Attending: Emergency Medicine | Admitting: Emergency Medicine

## 2019-02-18 ENCOUNTER — Emergency Department (HOSPITAL_COMMUNITY): Payer: Medicare Other

## 2019-02-18 ENCOUNTER — Other Ambulatory Visit (HOSPITAL_COMMUNITY): Payer: Self-pay

## 2019-02-18 ENCOUNTER — Encounter (HOSPITAL_COMMUNITY): Payer: Self-pay | Admitting: Emergency Medicine

## 2019-02-18 ENCOUNTER — Other Ambulatory Visit: Payer: Self-pay

## 2019-02-18 DIAGNOSIS — Z8673 Personal history of transient ischemic attack (TIA), and cerebral infarction without residual deficits: Secondary | ICD-10-CM | POA: Insufficient documentation

## 2019-02-18 DIAGNOSIS — M542 Cervicalgia: Secondary | ICD-10-CM | POA: Diagnosis not present

## 2019-02-18 DIAGNOSIS — M545 Low back pain: Secondary | ICD-10-CM | POA: Insufficient documentation

## 2019-02-18 DIAGNOSIS — W19XXXA Unspecified fall, initial encounter: Secondary | ICD-10-CM | POA: Diagnosis not present

## 2019-02-18 DIAGNOSIS — Z8546 Personal history of malignant neoplasm of prostate: Secondary | ICD-10-CM | POA: Diagnosis not present

## 2019-02-18 DIAGNOSIS — Z79899 Other long term (current) drug therapy: Secondary | ICD-10-CM | POA: Insufficient documentation

## 2019-02-18 DIAGNOSIS — R519 Headache, unspecified: Secondary | ICD-10-CM | POA: Insufficient documentation

## 2019-02-18 DIAGNOSIS — Z7984 Long term (current) use of oral hypoglycemic drugs: Secondary | ICD-10-CM | POA: Diagnosis not present

## 2019-02-18 DIAGNOSIS — Z7982 Long term (current) use of aspirin: Secondary | ICD-10-CM | POA: Insufficient documentation

## 2019-02-18 DIAGNOSIS — R404 Transient alteration of awareness: Secondary | ICD-10-CM | POA: Diagnosis not present

## 2019-02-18 DIAGNOSIS — S299XXA Unspecified injury of thorax, initial encounter: Secondary | ICD-10-CM | POA: Diagnosis not present

## 2019-02-18 DIAGNOSIS — R42 Dizziness and giddiness: Secondary | ICD-10-CM | POA: Diagnosis not present

## 2019-02-18 DIAGNOSIS — R2681 Unsteadiness on feet: Secondary | ICD-10-CM | POA: Diagnosis not present

## 2019-02-18 DIAGNOSIS — S3992XA Unspecified injury of lower back, initial encounter: Secondary | ICD-10-CM | POA: Diagnosis not present

## 2019-02-18 DIAGNOSIS — S0990XA Unspecified injury of head, initial encounter: Secondary | ICD-10-CM | POA: Diagnosis not present

## 2019-02-18 DIAGNOSIS — E039 Hypothyroidism, unspecified: Secondary | ICD-10-CM | POA: Diagnosis not present

## 2019-02-18 DIAGNOSIS — S199XXA Unspecified injury of neck, initial encounter: Secondary | ICD-10-CM | POA: Diagnosis not present

## 2019-02-18 DIAGNOSIS — N183 Chronic kidney disease, stage 3 unspecified: Secondary | ICD-10-CM | POA: Insufficient documentation

## 2019-02-18 DIAGNOSIS — S3993XA Unspecified injury of pelvis, initial encounter: Secondary | ICD-10-CM | POA: Diagnosis not present

## 2019-02-18 DIAGNOSIS — E1122 Type 2 diabetes mellitus with diabetic chronic kidney disease: Secondary | ICD-10-CM | POA: Insufficient documentation

## 2019-02-18 DIAGNOSIS — R109 Unspecified abdominal pain: Secondary | ICD-10-CM | POA: Diagnosis not present

## 2019-02-18 DIAGNOSIS — Z743 Need for continuous supervision: Secondary | ICD-10-CM | POA: Diagnosis not present

## 2019-02-18 LAB — BASIC METABOLIC PANEL
Anion gap: 11 (ref 5–15)
BUN: 18 mg/dL (ref 8–23)
CO2: 25 mmol/L (ref 22–32)
Calcium: 9.3 mg/dL (ref 8.9–10.3)
Chloride: 107 mmol/L (ref 98–111)
Creatinine, Ser: 1.18 mg/dL (ref 0.61–1.24)
GFR calc Af Amer: >60 mL/min (ref >60)
GFR calc non Af Amer: 57 mL/min — ABNORMAL LOW (ref >60)
Glucose, Bld: 95 mg/dL (ref 70–99)
Potassium: 4.1 mmol/L (ref 3.5–5.1)
Sodium: 143 mmol/L (ref 135–145)

## 2019-02-18 LAB — I-STAT CHEM 8, ED
BUN: 17 mg/dL (ref 8–23)
Calcium, Ion: 1.24 mmol/L (ref 1.15–1.40)
Chloride: 104 mmol/L (ref 98–111)
Creatinine, Ser: 1.1 mg/dL (ref 0.61–1.24)
Glucose, Bld: 89 mg/dL (ref 70–99)
HCT: 34 % — ABNORMAL LOW (ref 39.0–52.0)
Hemoglobin: 11.6 g/dL — ABNORMAL LOW (ref 13.0–17.0)
Potassium: 4 mmol/L (ref 3.5–5.1)
Sodium: 142 mmol/L (ref 135–145)
TCO2: 25 mmol/L (ref 22–32)

## 2019-02-18 LAB — PROTIME-INR
INR: 1.1 (ref 0.8–1.2)
Prothrombin Time: 13.7 seconds (ref 11.4–15.2)

## 2019-02-18 LAB — CBC WITH DIFFERENTIAL/PLATELET
Abs Immature Granulocytes: 0.04 10*3/uL (ref 0.00–0.07)
Basophils Absolute: 0.1 10*3/uL (ref 0.0–0.1)
Basophils Relative: 1 %
Eosinophils Absolute: 0.8 10*3/uL — ABNORMAL HIGH (ref 0.0–0.5)
Eosinophils Relative: 8 %
HCT: 34.7 % — ABNORMAL LOW (ref 39.0–52.0)
Hemoglobin: 11.1 g/dL — ABNORMAL LOW (ref 13.0–17.0)
Immature Granulocytes: 0 %
Lymphocytes Relative: 23 %
Lymphs Abs: 2.3 10*3/uL (ref 0.7–4.0)
MCH: 33.3 pg (ref 26.0–34.0)
MCHC: 32 g/dL (ref 30.0–36.0)
MCV: 104.2 fL — ABNORMAL HIGH (ref 80.0–100.0)
Monocytes Absolute: 0.7 10*3/uL (ref 0.1–1.0)
Monocytes Relative: 7 %
Neutro Abs: 5.9 10*3/uL (ref 1.7–7.7)
Neutrophils Relative %: 61 %
Platelets: 153 10*3/uL (ref 150–400)
RBC: 3.33 MIL/uL — ABNORMAL LOW (ref 4.22–5.81)
RDW: 13.2 % (ref 11.5–15.5)
WBC: 9.8 10*3/uL (ref 4.0–10.5)
nRBC: 0 % (ref 0.0–0.2)

## 2019-02-18 LAB — URINALYSIS, ROUTINE W REFLEX MICROSCOPIC
Bilirubin Urine: NEGATIVE
Glucose, UA: NEGATIVE mg/dL
Hgb urine dipstick: NEGATIVE
Ketones, ur: NEGATIVE mg/dL
Nitrite: NEGATIVE
Protein, ur: 30 mg/dL — AB
Specific Gravity, Urine: 1.024 (ref 1.005–1.030)
pH: 5 (ref 5.0–8.0)

## 2019-02-18 LAB — APTT: aPTT: 29 seconds (ref 24–36)

## 2019-02-18 LAB — RAPID URINE DRUG SCREEN, HOSP PERFORMED
Amphetamines: NOT DETECTED
Barbiturates: NOT DETECTED
Benzodiazepines: NOT DETECTED
Cocaine: NOT DETECTED
Opiates: NOT DETECTED
Tetrahydrocannabinol: NOT DETECTED

## 2019-02-18 LAB — ETHANOL: Alcohol, Ethyl (B): <10 mg/dL (ref <10)

## 2019-02-18 NOTE — ED Notes (Signed)
[facility] called for transportation  

## 2019-02-18 NOTE — ED Notes (Signed)
Provided pt with water for fluid challenge.  Attempted to ambulate pt with Dr.Rancour and pt was unsteady on his feet and complaining of dizziness.  Pt returned to bed without incident.

## 2019-02-18 NOTE — ED Notes (Signed)
[facility] here for transportation to [facility]

## 2019-02-18 NOTE — ED Provider Notes (Signed)
[facility] DEPT Provider Note   CSN: [id number] Arrival date & time: 02/18/19  1745     History   Chief Complaint Chief Complaint  Patient presents with   Fall    HPI Jeremiah Mccoy is a 85 y.o. male.     Patient here after a falling at his facility.  States he bent over to pick up a pen when he fell backwards striking his head and neck.  Complains of pain to his neck and his back and head. States he has issues with dizziness in the past and believes he became dizzy when he bent over to pick up the pen.  He denies any room spinning or vertigo.  No chest pain or shortness of breath.  He complains of pain to the back of his head and his neck and his low back.  Does not take any blood thinners.  He denies any abdominal pain, chest pain, difficulty breathing, cough or fever. No difficult speaking or swallowing.  States he is not dizzy currently.  No focal weakness, numbness or tingling.  No difficulty breathing or difficulty swallowing.  The history is provided by the patient.  Fall Associated symptoms include headaches. Pertinent negatives include no chest pain, no abdominal pain and no shortness of breath.    Past Medical History:  Diagnosis Date   Cancer ([place])    Depression    Diabetes mellitus without complication ([place])    Prostate cancer ([place])    Stroke [facility])     Patient Active Problem List   Diagnosis Date Noted   Syncope 10/16/2018   Elevated LDL cholesterol level 05/07/2018   Elevated TSH 05/07/2018   Hypothyroidism 05/07/2018   Slow transit constipation 05/07/2018   Insomnia 02/15/2018   Bronchitis 02/07/2018   Type II diabetes mellitus with renal manifestations ([place]) 01/29/2018   HLD (hyperlipidemia) 01/29/2018   Prostate cancer ([place]) 01/29/2018   CKD (chronic kidney disease), stage III 01/29/2018   Dizziness 01/29/2018   Hospital discharge follow-up 01/11/2018   Thrombocytopenia ([place]) 01/05/2018   AKI  (acute kidney injury) ([place]) 01/05/2018   Lightheadedness 01/04/2018   Stroke ([place]) 01/04/2018   Recurrent prostate cancer ([place]) 10/24/2017   Traumatic ecchymosis of abdominal wall 10/19/2017   Macrocytosis without anemia 07/17/2017   Macrocytosis 07/17/2017   GERD (gastroesophageal reflux disease) 06/28/2017   Chest pain 06/22/2017   Elevated PSA, less than 10 ng/ml 04/17/2017   Type 2 diabetes mellitus with complication, with long-term current use of insulin ([place]) 03/15/2017   Elevated cholesterol 03/15/2017   History of CVA (cerebrovascular accident) 03/15/2017   Non-compliance 03/15/2017   History of prostate cancer 03/15/2017   Continuous leakage of urine 03/15/2017   Erectile dysfunction following prostate ablative therapy 03/15/2017    Past Surgical History:  Procedure Laterality Date   APPENDECTOMY     CHOLECYSTECTOMY     PROSTATECTOMY          Home Medications    Prior to Admission medications   Medication Sig Start Date End Date Taking? Authorizing Provider  aspirin EC 81 MG tablet Take 1 tablet (81 mg total) by mouth daily. 05/07/18   Libby Maw, MD  atorvastatin (LIPITOR) 40 MG tablet TAKE 1 TABLET(40 MG) BY MOUTH DAILY AT 6 PM 01/07/19   Libby Maw, MD  metFORMIN (GLUCOPHAGE) 500 MG tablet TAKE 1 TABLET BY MOUTH TWICE DAILY WITH MEAL 05/08/18   Libby Maw, MD  traZODone (DESYREL) 50 MG tablet TAKE 1 TABLET(50 MG) BY  MOUTH AT BEDTIME AS NEEDED FOR SLEEP 01/15/19   Libby Maw, MD  vitamin B-12 1000 MCG tablet Take 1 tablet (1,000 mcg total) by mouth daily. 01/06/18   Geradine Girt, DO    Family History Family History  Problem Relation Age of Onset   Colon cancer Mother    Stroke Father    Diabetes Brother    Breast cancer Neg Hx    Prostate cancer Neg Hx    Pancreatic cancer Neg Hx     Social History Social History   Tobacco Use   Smoking status: Never Smoker   Smokeless  tobacco: Never Used  Substance Use Topics   Alcohol use: No    Frequency: Never   Drug use: No     Allergies   Morphine and related, Motrin [ibuprofen], and Sulfa antibiotics   Review of Systems Review of Systems  Constitutional: Negative for activity change, appetite change and fever.  HENT: Negative for congestion and rhinorrhea.   Eyes: Negative for visual disturbance.  Respiratory: Negative for cough, chest tightness and shortness of breath.   Cardiovascular: Negative for chest pain.  Gastrointestinal: Negative for abdominal pain, nausea and vomiting.  Genitourinary: Negative for dysuria and hematuria.  Musculoskeletal: Positive for arthralgias, myalgias and neck pain.  Skin: Negative for rash.  Neurological: Positive for dizziness, light-headedness and headaches. Negative for weakness.    all other systems are negative except as noted in the HPI and PMH.    Physical Exam Updated Vital Signs BP (!) 155/80    Pulse 70    Temp 98 F (36.7 C) (Oral)    Resp 16    SpO2 99%   Physical Exam Vitals signs and nursing note reviewed.  Constitutional:      General: He is not in acute distress.    Appearance: Normal appearance. He is well-developed and normal weight. He is not ill-appearing.  HENT:     Head: Normocephalic and atraumatic.     Mouth/Throat:     Pharynx: No oropharyngeal exudate.  Eyes:     Conjunctiva/sclera: Conjunctivae normal.     Pupils: Pupils are equal, round, and reactive to light.  Neck:     Musculoskeletal: Normal range of motion and neck supple.     Comments: Paraspinal cervical tenderness, no midline tenderness Cardiovascular:     Rate and Rhythm: Normal rate and regular rhythm.     Heart sounds: Normal heart sounds. No murmur.  Pulmonary:     Effort: Pulmonary effort is normal. No respiratory distress.     Breath sounds: Normal breath sounds.  Chest:     Chest wall: No tenderness.  Abdominal:     Palpations: Abdomen is soft.      Tenderness: There is no abdominal tenderness. There is no guarding or rebound.  Musculoskeletal: Normal range of motion.        General: No tenderness.     Comments: Midline lumbar tenderness without step-off or deformity  Full range of motion of hips without pain  Skin:    General: Skin is warm.  Neurological:     Mental Status: He is alert and oriented to person, place, and time.     Cranial Nerves: No cranial nerve deficit.     Motor: No abnormal muscle tone.     Coordination: Coordination normal.     Comments: Oriented to person and place.  Cranial nerves II to XII intact, no facial asymmetry, 5/5 strength throughout, no pronator drift Intention tremor on finger  to nose bilaterally.  Positive Romberg, unsteady gait with assistance  Psychiatric:        Behavior: Behavior normal.      ED Treatments / Results  Labs (all labs ordered are listed, but only abnormal results are displayed) Labs Reviewed  CBC WITH DIFFERENTIAL/PLATELET - Abnormal; Notable for the following components:      Result Value   RBC 3.33 (*)    Hemoglobin 11.1 (*)    HCT 34.7 (*)    MCV 104.2 (*)    Eosinophils Absolute 0.8 (*)    All other components within normal limits  BASIC METABOLIC PANEL - Abnormal; Notable for the following components:   GFR calc non Af Amer 57 (*)    All other components within normal limits  URINALYSIS, ROUTINE W REFLEX MICROSCOPIC - Abnormal; Notable for the following components:   Protein, ur 30 (*)    Leukocytes,Ua SMALL (*)    Bacteria, UA RARE (*)    All other components within normal limits  I-STAT CHEM 8, ED - Abnormal; Notable for the following components:   Hemoglobin 11.6 (*)    HCT 34.0 (*)    All other components within normal limits  ETHANOL  PROTIME-INR  APTT  RAPID URINE DRUG SCREEN, HOSP PERFORMED  CBG MONITORING, ED    EKG None  Radiology Dg Lumbar Spine 2-3 Views  Result Date: 02/18/2019 CLINICAL DATA:  Fall EXAM: LUMBAR SPINE - 2-3 VIEW  COMPARISON:  CT 01/31/2018 FINDINGS: Five non rib-bearing lumbar type vertebra. Vertebral body heights are maintained. Mild diffuse degenerative changes with multiple level osteophyte and disc space narrowing. Posterior facet degenerative change of the lower lumbar spine IMPRESSION: Degenerative changes.  No acute osseous abnormality. Electronically Signed   By: Donavan Foil M.D.   On: 02/18/2019 19:07   Dg Pelvis 1-2 Views  Result Date: 02/18/2019 CLINICAL DATA:  Mechanical fall EXAM: PELVIS - 1-2 VIEW COMPARISON:  None. FINDINGS: No fracture or malalignment. Pubic symphysis and rami are intact. Mild degenerative changes of the hips. IMPRESSION: No acute osseous abnormality. Electronically Signed   By: Donavan Foil M.D.   On: 02/18/2019 19:07   Ct Head Wo Contrast  Result Date: 02/18/2019 CLINICAL DATA:  Mechanical fall. Neck pain. Back pain. Dementia. EXAM: CT HEAD WITHOUT CONTRAST CT CERVICAL SPINE WITHOUT CONTRAST TECHNIQUE: Multidetector CT imaging of the head and cervical spine was performed following the standard protocol without intravenous contrast. Multiplanar CT image reconstructions of the cervical spine were also generated. COMPARISON:  10/15/2018 head and cervical spine CT. FINDINGS: CT HEAD FINDINGS Brain: Right parietal encephalomalacia is unchanged. No evidence of parenchymal hemorrhage or extra-axial fluid collection. No mass lesion, mass effect, or midline shift. No CT evidence of acute infarction. Generalized cerebral volume loss. Nonspecific prominent subcortical and periventricular white matter hypodensity, most in keeping with chronic small vessel ischemic change. Cerebral ventricle sizes are stable and concordant with the degree of cerebral volume loss. Vascular: No acute abnormality. Skull: No evidence of calvarial fracture. Sinuses/Orbits: The visualized paranasal sinuses are essentially clear. Other:  The mastoid air cells are unopacified. CT CERVICAL SPINE FINDINGS Alignment:  Slight straightening of the cervical spine. No facet subluxation. Dens is well positioned between the lateral masses of C1. Skull base and vertebrae: No acute fracture. No primary bone lesion or focal pathologic process. Soft tissues and spinal canal: No prevertebral edema. No visible canal hematoma. Disc levels: Moderate-to-marked multilevel cervical degenerative disc disease, most prominent at C6-7. Advanced bilateral cervical facet arthropathy. Moderate left  and mild right degenerative foraminal stenosis at C3-4. Mild bilateral degenerative foraminal stenosis at C4-5. Moderate right and mild left degenerative foraminal stenosis at C5-6. Upper chest: No acute abnormality. Other: Visualized mastoid air cells appear clear. No discrete thyroid nodules. No pathologically enlarged cervical nodes. IMPRESSION: 1. No evidence of acute intracranial abnormality. No evidence of calvarial fracture. 2. Right parietal encephalomalacia compatible with remote infarct. 3. Generalized cerebral volume loss and prominent chronic small vessel ischemic changes in the cerebral white matter. 4. No cervical spine fracture or facet subluxation. 5. Moderate to severe multilevel degenerative changes in the cervical spine as detailed. Electronically Signed   By: Ilona Sorrel M.D.   On: 02/18/2019 19:10   Ct Cervical Spine Wo Contrast  Result Date: 02/18/2019 CLINICAL DATA:  Mechanical fall. Neck pain. Back pain. Dementia. EXAM: CT HEAD WITHOUT CONTRAST CT CERVICAL SPINE WITHOUT CONTRAST TECHNIQUE: Multidetector CT imaging of the head and cervical spine was performed following the standard protocol without intravenous contrast. Multiplanar CT image reconstructions of the cervical spine were also generated. COMPARISON:  10/15/2018 head and cervical spine CT. FINDINGS: CT HEAD FINDINGS Brain: Right parietal encephalomalacia is unchanged. No evidence of parenchymal hemorrhage or extra-axial fluid collection. No mass lesion, mass effect, or  midline shift. No CT evidence of acute infarction. Generalized cerebral volume loss. Nonspecific prominent subcortical and periventricular white matter hypodensity, most in keeping with chronic small vessel ischemic change. Cerebral ventricle sizes are stable and concordant with the degree of cerebral volume loss. Vascular: No acute abnormality. Skull: No evidence of calvarial fracture. Sinuses/Orbits: The visualized paranasal sinuses are essentially clear. Other:  The mastoid air cells are unopacified. CT CERVICAL SPINE FINDINGS Alignment: Slight straightening of the cervical spine. No facet subluxation. Dens is well positioned between the lateral masses of C1. Skull base and vertebrae: No acute fracture. No primary bone lesion or focal pathologic process. Soft tissues and spinal canal: No prevertebral edema. No visible canal hematoma. Disc levels: Moderate-to-marked multilevel cervical degenerative disc disease, most prominent at C6-7. Advanced bilateral cervical facet arthropathy. Moderate left and mild right degenerative foraminal stenosis at C3-4. Mild bilateral degenerative foraminal stenosis at C4-5. Moderate right and mild left degenerative foraminal stenosis at C5-6. Upper chest: No acute abnormality. Other: Visualized mastoid air cells appear clear. No discrete thyroid nodules. No pathologically enlarged cervical nodes. IMPRESSION: 1. No evidence of acute intracranial abnormality. No evidence of calvarial fracture. 2. Right parietal encephalomalacia compatible with remote infarct. 3. Generalized cerebral volume loss and prominent chronic small vessel ischemic changes in the cerebral white matter. 4. No cervical spine fracture or facet subluxation. 5. Moderate to severe multilevel degenerative changes in the cervical spine as detailed. Electronically Signed   By: Ilona Sorrel M.D.   On: 02/18/2019 19:10    Procedures Procedures (including critical care time)  Medications Ordered in ED Medications -  No data to display   Initial Impression / Assessment and Plan / ED Course  I have reviewed the triage vital signs and the nursing notes.  Pertinent labs & imaging results that were available during my care of the patient were reviewed by me and considered in my medical decision making (see chart for details).       Fall with head and neck and back pain.  No blood thinner use.  Complains of some dizziness and difficulty walking and falling to the side.  Unclear last seen normal.   With attempted ambulation, patient is very unsteady and ataxic with a wide-based gait and falling  over with assistance.  CT head is negative for acute traumatic pathology.  Does shows areas of chronic infarcts.  Patient with increased dizziness and lightheadedness which he states worsened when he went to bend down earlier today.  His CT is negative for trauma or acute pathology.  With his ongoing ataxia and difficulty with balance, will transfer for MRI to rule out stroke which is not available at this facility.  Unclear last seen normal.  He is not a candidate for TPA.  Discussed with Dr. Wilson Singer.  ED ECG REPORT   Date: 02/18/2019  Rate: 70  Rhythm: normal sinus rhythm  QRS Axis: normal  Intervals: PR prolonged  ST/T Wave abnormalities: normal  Conduction Disutrbances:first-degree A-V block   Narrative Interpretation:   Old EKG Reviewed: unchanged  I have personally reviewed the EKG tracing and agree with the computerized printout as noted.   Final Clinical Impressions(s) / ED Diagnoses   Final diagnoses:  Fall, initial encounter  Unsteady gait    ED Discharge Orders    None       Aldyn Toon, Annie Main, MD 02/19/19 0001

## 2019-02-18 NOTE — ED Triage Notes (Signed)
Pt EMS pt mechanical fall; pt bent over to pick up something when coming back up fell backwards; complaint of neck/back pain; c collar in place. Pt has ambulated to dining room post fall without issues. Pt denies pain with triage. Pt hx of dementia but able to share situation/event.

## 2019-02-18 NOTE — ED Notes (Signed)
Pt in imaging at present.

## 2019-02-18 NOTE — ED Provider Notes (Signed)
11:05 PM Patient received in transfer from [facility].  Patient bent over today, lost his balance and fell.  He has had some persistent dizziness and difficulty walking/balancing.  He also reports that he has had some slurred speech.  He had negative CT head.  Labs are largely unremarkable.  CT shows remote infarct.  Sent to [facility] for MRI to rule out stroke.    Patient takes aspirin 81mg  daily.  Neuro: CN3-12 intact, normal finger to nose, ROM and strength of extremities is normal, had a couple episodes of pressured speech, struggling to say the word "wobbly and slurred," but seemed more due to anxiety and not so much dysarthria.  MRI ordered.  12:15 AM Patient reassessed.  Speech is normal.  Happier now that he had some food.  1:07 AM MRI requests CXR and KUB to verify no metallic FB.  [id number] AM MRI is negative for acute stroke.  I discussed the case with Dr. Lorraine Lax, who recommends outpatient follow-up.  I attempted to contact both patient's spouse and his daughter, but was unsuccessful.  Patient will be discharged back to his facility.   Hoy, Ly[place], PA-C 02/19/19 [id number]    Merryl Hacker, MD 02/19/19 [id number]

## 2019-02-18 NOTE — ED Notes (Signed)
Charge RN at [facility] aware that patient is coming for an MRI, Dr Wilson Singer accepted and will disposition from [facility]

## 2019-02-19 ENCOUNTER — Emergency Department (HOSPITAL_COMMUNITY): Payer: Medicare Other

## 2019-02-19 DIAGNOSIS — R42 Dizziness and giddiness: Secondary | ICD-10-CM | POA: Diagnosis not present

## 2019-02-19 DIAGNOSIS — S0990XA Unspecified injury of head, initial encounter: Secondary | ICD-10-CM | POA: Diagnosis not present

## 2019-02-19 DIAGNOSIS — S299XXA Unspecified injury of thorax, initial encounter: Secondary | ICD-10-CM | POA: Diagnosis not present

## 2019-02-19 DIAGNOSIS — R109 Unspecified abdominal pain: Secondary | ICD-10-CM | POA: Diagnosis not present

## 2019-02-19 DIAGNOSIS — M542 Cervicalgia: Secondary | ICD-10-CM | POA: Diagnosis not present

## 2019-02-19 LAB — CBG MONITORING, ED: Glucose-Capillary: 103 mg/dL — ABNORMAL HIGH (ref 70–99)

## 2019-02-19 NOTE — ED Notes (Signed)
Pt lives at [facility], memory care unit.

## 2019-02-19 NOTE — Discharge Instructions (Addendum)
You CT scan and MRI showed NO evidence of new stroke.  Please make an appointment with your doctor for ER follow-up.  Return for new or worsening symptoms.

## 2019-02-19 NOTE — ED Notes (Signed)
[facility] called to transport patient  

## 2019-02-25 DIAGNOSIS — E782 Mixed hyperlipidemia: Secondary | ICD-10-CM | POA: Diagnosis not present

## 2019-02-25 DIAGNOSIS — E119 Type 2 diabetes mellitus without complications: Secondary | ICD-10-CM | POA: Diagnosis not present

## 2019-02-25 DIAGNOSIS — W19XXXA Unspecified fall, initial encounter: Secondary | ICD-10-CM | POA: Diagnosis not present

## 2019-02-25 DIAGNOSIS — Z7982 Long term (current) use of aspirin: Secondary | ICD-10-CM | POA: Diagnosis not present

## 2019-02-25 DIAGNOSIS — F5101 Primary insomnia: Secondary | ICD-10-CM | POA: Diagnosis not present

## 2019-02-28 DIAGNOSIS — F329 Major depressive disorder, single episode, unspecified: Secondary | ICD-10-CM | POA: Diagnosis not present

## 2019-02-28 DIAGNOSIS — E039 Hypothyroidism, unspecified: Secondary | ICD-10-CM | POA: Diagnosis not present

## 2019-02-28 DIAGNOSIS — I69398 Other sequelae of cerebral infarction: Secondary | ICD-10-CM | POA: Diagnosis not present

## 2019-02-28 DIAGNOSIS — N183 Chronic kidney disease, stage 3 unspecified: Secondary | ICD-10-CM | POA: Diagnosis not present

## 2019-02-28 DIAGNOSIS — Z9181 History of falling: Secondary | ICD-10-CM | POA: Diagnosis not present

## 2019-02-28 DIAGNOSIS — E1122 Type 2 diabetes mellitus with diabetic chronic kidney disease: Secondary | ICD-10-CM | POA: Diagnosis not present

## 2019-02-28 DIAGNOSIS — D631 Anemia in chronic kidney disease: Secondary | ICD-10-CM | POA: Diagnosis not present

## 2019-02-28 DIAGNOSIS — Z7984 Long term (current) use of oral hypoglycemic drugs: Secondary | ICD-10-CM | POA: Diagnosis not present

## 2019-02-28 DIAGNOSIS — F5101 Primary insomnia: Secondary | ICD-10-CM | POA: Diagnosis not present

## 2019-02-28 DIAGNOSIS — R2689 Other abnormalities of gait and mobility: Secondary | ICD-10-CM | POA: Diagnosis not present

## 2019-02-28 DIAGNOSIS — Z7982 Long term (current) use of aspirin: Secondary | ICD-10-CM | POA: Diagnosis not present

## 2019-02-28 DIAGNOSIS — D519 Vitamin B12 deficiency anemia, unspecified: Secondary | ICD-10-CM | POA: Diagnosis not present

## 2019-02-28 DIAGNOSIS — E782 Mixed hyperlipidemia: Secondary | ICD-10-CM | POA: Diagnosis not present

## 2019-03-04 DIAGNOSIS — R2689 Other abnormalities of gait and mobility: Secondary | ICD-10-CM | POA: Diagnosis not present

## 2019-03-04 DIAGNOSIS — N183 Chronic kidney disease, stage 3 unspecified: Secondary | ICD-10-CM | POA: Diagnosis not present

## 2019-03-04 DIAGNOSIS — E1122 Type 2 diabetes mellitus with diabetic chronic kidney disease: Secondary | ICD-10-CM | POA: Diagnosis not present

## 2019-03-04 DIAGNOSIS — E782 Mixed hyperlipidemia: Secondary | ICD-10-CM | POA: Diagnosis not present

## 2019-03-04 DIAGNOSIS — D631 Anemia in chronic kidney disease: Secondary | ICD-10-CM | POA: Diagnosis not present

## 2019-03-04 DIAGNOSIS — I69398 Other sequelae of cerebral infarction: Secondary | ICD-10-CM | POA: Diagnosis not present

## 2019-03-06 DIAGNOSIS — E782 Mixed hyperlipidemia: Secondary | ICD-10-CM | POA: Diagnosis not present

## 2019-03-06 DIAGNOSIS — D631 Anemia in chronic kidney disease: Secondary | ICD-10-CM | POA: Diagnosis not present

## 2019-03-06 DIAGNOSIS — R2689 Other abnormalities of gait and mobility: Secondary | ICD-10-CM | POA: Diagnosis not present

## 2019-03-06 DIAGNOSIS — N183 Chronic kidney disease, stage 3 unspecified: Secondary | ICD-10-CM | POA: Diagnosis not present

## 2019-03-06 DIAGNOSIS — I69398 Other sequelae of cerebral infarction: Secondary | ICD-10-CM | POA: Diagnosis not present

## 2019-03-06 DIAGNOSIS — E1122 Type 2 diabetes mellitus with diabetic chronic kidney disease: Secondary | ICD-10-CM | POA: Diagnosis not present

## 2019-03-08 DIAGNOSIS — E782 Mixed hyperlipidemia: Secondary | ICD-10-CM | POA: Diagnosis not present

## 2019-03-08 DIAGNOSIS — I69398 Other sequelae of cerebral infarction: Secondary | ICD-10-CM | POA: Diagnosis not present

## 2019-03-08 DIAGNOSIS — R2689 Other abnormalities of gait and mobility: Secondary | ICD-10-CM | POA: Diagnosis not present

## 2019-03-08 DIAGNOSIS — E1122 Type 2 diabetes mellitus with diabetic chronic kidney disease: Secondary | ICD-10-CM | POA: Diagnosis not present

## 2019-03-08 DIAGNOSIS — D631 Anemia in chronic kidney disease: Secondary | ICD-10-CM | POA: Diagnosis not present

## 2019-03-08 DIAGNOSIS — N183 Chronic kidney disease, stage 3 unspecified: Secondary | ICD-10-CM | POA: Diagnosis not present

## 2019-03-11 DIAGNOSIS — N183 Chronic kidney disease, stage 3 unspecified: Secondary | ICD-10-CM | POA: Diagnosis not present

## 2019-03-11 DIAGNOSIS — D631 Anemia in chronic kidney disease: Secondary | ICD-10-CM | POA: Diagnosis not present

## 2019-03-11 DIAGNOSIS — E782 Mixed hyperlipidemia: Secondary | ICD-10-CM | POA: Diagnosis not present

## 2019-03-11 DIAGNOSIS — I69398 Other sequelae of cerebral infarction: Secondary | ICD-10-CM | POA: Diagnosis not present

## 2019-03-11 DIAGNOSIS — R2689 Other abnormalities of gait and mobility: Secondary | ICD-10-CM | POA: Diagnosis not present

## 2019-03-11 DIAGNOSIS — E1122 Type 2 diabetes mellitus with diabetic chronic kidney disease: Secondary | ICD-10-CM | POA: Diagnosis not present

## 2019-03-12 DIAGNOSIS — D631 Anemia in chronic kidney disease: Secondary | ICD-10-CM | POA: Diagnosis not present

## 2019-03-12 DIAGNOSIS — I69398 Other sequelae of cerebral infarction: Secondary | ICD-10-CM | POA: Diagnosis not present

## 2019-03-12 DIAGNOSIS — E1122 Type 2 diabetes mellitus with diabetic chronic kidney disease: Secondary | ICD-10-CM | POA: Diagnosis not present

## 2019-03-12 DIAGNOSIS — N183 Chronic kidney disease, stage 3 unspecified: Secondary | ICD-10-CM | POA: Diagnosis not present

## 2019-03-12 DIAGNOSIS — R2689 Other abnormalities of gait and mobility: Secondary | ICD-10-CM | POA: Diagnosis not present

## 2019-03-12 DIAGNOSIS — E782 Mixed hyperlipidemia: Secondary | ICD-10-CM | POA: Diagnosis not present

## 2019-03-14 DIAGNOSIS — E1122 Type 2 diabetes mellitus with diabetic chronic kidney disease: Secondary | ICD-10-CM | POA: Diagnosis not present

## 2019-03-14 DIAGNOSIS — E782 Mixed hyperlipidemia: Secondary | ICD-10-CM | POA: Diagnosis not present

## 2019-03-14 DIAGNOSIS — N183 Chronic kidney disease, stage 3 unspecified: Secondary | ICD-10-CM | POA: Diagnosis not present

## 2019-03-14 DIAGNOSIS — D631 Anemia in chronic kidney disease: Secondary | ICD-10-CM | POA: Diagnosis not present

## 2019-03-14 DIAGNOSIS — R2689 Other abnormalities of gait and mobility: Secondary | ICD-10-CM | POA: Diagnosis not present

## 2019-03-14 DIAGNOSIS — I69398 Other sequelae of cerebral infarction: Secondary | ICD-10-CM | POA: Diagnosis not present

## 2019-03-15 DIAGNOSIS — E1122 Type 2 diabetes mellitus with diabetic chronic kidney disease: Secondary | ICD-10-CM | POA: Diagnosis not present

## 2019-03-15 DIAGNOSIS — I69398 Other sequelae of cerebral infarction: Secondary | ICD-10-CM | POA: Diagnosis not present

## 2019-03-15 DIAGNOSIS — R2689 Other abnormalities of gait and mobility: Secondary | ICD-10-CM | POA: Diagnosis not present

## 2019-03-15 DIAGNOSIS — D631 Anemia in chronic kidney disease: Secondary | ICD-10-CM | POA: Diagnosis not present

## 2019-03-15 DIAGNOSIS — N183 Chronic kidney disease, stage 3 unspecified: Secondary | ICD-10-CM | POA: Diagnosis not present

## 2019-03-15 DIAGNOSIS — E782 Mixed hyperlipidemia: Secondary | ICD-10-CM | POA: Diagnosis not present

## 2019-03-18 DIAGNOSIS — R2689 Other abnormalities of gait and mobility: Secondary | ICD-10-CM | POA: Diagnosis not present

## 2019-03-18 DIAGNOSIS — E1122 Type 2 diabetes mellitus with diabetic chronic kidney disease: Secondary | ICD-10-CM | POA: Diagnosis not present

## 2019-03-18 DIAGNOSIS — I69398 Other sequelae of cerebral infarction: Secondary | ICD-10-CM | POA: Diagnosis not present

## 2019-03-18 DIAGNOSIS — N183 Chronic kidney disease, stage 3 unspecified: Secondary | ICD-10-CM | POA: Diagnosis not present

## 2019-03-18 DIAGNOSIS — E782 Mixed hyperlipidemia: Secondary | ICD-10-CM | POA: Diagnosis not present

## 2019-03-18 DIAGNOSIS — D631 Anemia in chronic kidney disease: Secondary | ICD-10-CM | POA: Diagnosis not present

## 2019-03-20 DIAGNOSIS — D631 Anemia in chronic kidney disease: Secondary | ICD-10-CM | POA: Diagnosis not present

## 2019-03-20 DIAGNOSIS — E782 Mixed hyperlipidemia: Secondary | ICD-10-CM | POA: Diagnosis not present

## 2019-03-20 DIAGNOSIS — I69398 Other sequelae of cerebral infarction: Secondary | ICD-10-CM | POA: Diagnosis not present

## 2019-03-20 DIAGNOSIS — R2689 Other abnormalities of gait and mobility: Secondary | ICD-10-CM | POA: Diagnosis not present

## 2019-03-20 DIAGNOSIS — E1122 Type 2 diabetes mellitus with diabetic chronic kidney disease: Secondary | ICD-10-CM | POA: Diagnosis not present

## 2019-03-20 DIAGNOSIS — N183 Chronic kidney disease, stage 3 unspecified: Secondary | ICD-10-CM | POA: Diagnosis not present

## 2019-03-23 ENCOUNTER — Other Ambulatory Visit: Payer: Self-pay

## 2019-03-23 ENCOUNTER — Emergency Department (HOSPITAL_COMMUNITY): Payer: Medicare Other

## 2019-03-23 ENCOUNTER — Encounter (HOSPITAL_COMMUNITY): Payer: Self-pay

## 2019-03-23 ENCOUNTER — Emergency Department (HOSPITAL_COMMUNITY)
Admission: EM | Admit: 2019-03-23 | Discharge: 2019-03-23 | Disposition: A | Discharge status: Home / Self Care | Payer: Medicare Other | Attending: Emergency Medicine | Admitting: Emergency Medicine

## 2019-03-23 DIAGNOSIS — Z7982 Long term (current) use of aspirin: Secondary | ICD-10-CM | POA: Insufficient documentation

## 2019-03-23 DIAGNOSIS — S0990XA Unspecified injury of head, initial encounter: Secondary | ICD-10-CM | POA: Diagnosis not present

## 2019-03-23 DIAGNOSIS — N183 Chronic kidney disease, stage 3 unspecified: Secondary | ICD-10-CM | POA: Insufficient documentation

## 2019-03-23 DIAGNOSIS — R4182 Altered mental status, unspecified: Secondary | ICD-10-CM | POA: Diagnosis not present

## 2019-03-23 DIAGNOSIS — M545 Low back pain: Secondary | ICD-10-CM | POA: Diagnosis not present

## 2019-03-23 DIAGNOSIS — R42 Dizziness and giddiness: Secondary | ICD-10-CM | POA: Diagnosis not present

## 2019-03-23 DIAGNOSIS — I129 Hypertensive chronic kidney disease with stage 1 through stage 4 chronic kidney disease, or unspecified chronic kidney disease: Secondary | ICD-10-CM | POA: Diagnosis not present

## 2019-03-23 DIAGNOSIS — E1122 Type 2 diabetes mellitus with diabetic chronic kidney disease: Secondary | ICD-10-CM | POA: Insufficient documentation

## 2019-03-23 DIAGNOSIS — W19XXXA Unspecified fall, initial encounter: Secondary | ICD-10-CM

## 2019-03-23 DIAGNOSIS — Z8673 Personal history of transient ischemic attack (TIA), and cerebral infarction without residual deficits: Secondary | ICD-10-CM | POA: Diagnosis not present

## 2019-03-23 DIAGNOSIS — R4020 Unspecified coma: Secondary | ICD-10-CM | POA: Diagnosis not present

## 2019-03-23 DIAGNOSIS — R55 Syncope and collapse: Secondary | ICD-10-CM | POA: Diagnosis not present

## 2019-03-23 DIAGNOSIS — S59901A Unspecified injury of right elbow, initial encounter: Secondary | ICD-10-CM | POA: Diagnosis not present

## 2019-03-23 DIAGNOSIS — M25521 Pain in right elbow: Secondary | ICD-10-CM | POA: Insufficient documentation

## 2019-03-23 DIAGNOSIS — Z7984 Long term (current) use of oral hypoglycemic drugs: Secondary | ICD-10-CM | POA: Diagnosis not present

## 2019-03-23 DIAGNOSIS — R52 Pain, unspecified: Secondary | ICD-10-CM | POA: Diagnosis not present

## 2019-03-23 DIAGNOSIS — Z743 Need for continuous supervision: Secondary | ICD-10-CM | POA: Diagnosis not present

## 2019-03-23 DIAGNOSIS — R279 Unspecified lack of coordination: Secondary | ICD-10-CM | POA: Diagnosis not present

## 2019-03-23 DIAGNOSIS — S199XXA Unspecified injury of neck, initial encounter: Secondary | ICD-10-CM | POA: Diagnosis not present

## 2019-03-23 LAB — BASIC METABOLIC PANEL
Anion gap: 8 (ref 5–15)
BUN: 20 mg/dL (ref 8–23)
CO2: 25 mmol/L (ref 22–32)
Calcium: 8.8 mg/dL — ABNORMAL LOW (ref 8.9–10.3)
Chloride: 109 mmol/L (ref 98–111)
Creatinine, Ser: 1.02 mg/dL (ref 0.61–1.24)
GFR calc Af Amer: >60 mL/min (ref >60)
GFR calc non Af Amer: >60 mL/min (ref >60)
Glucose, Bld: 110 mg/dL — ABNORMAL HIGH (ref 70–99)
Potassium: 4.1 mmol/L (ref 3.5–5.1)
Sodium: 142 mmol/L (ref 135–145)

## 2019-03-23 LAB — CBC WITH DIFFERENTIAL/PLATELET
Abs Immature Granulocytes: 0.02 10*3/uL (ref 0.00–0.07)
Basophils Absolute: 0.1 10*3/uL (ref 0.0–0.1)
Basophils Relative: 1 %
Eosinophils Absolute: 0.7 10*3/uL — ABNORMAL HIGH (ref 0.0–0.5)
Eosinophils Relative: 9 %
HCT: 34.2 % — ABNORMAL LOW (ref 39.0–52.0)
Hemoglobin: 10.9 g/dL — ABNORMAL LOW (ref 13.0–17.0)
Immature Granulocytes: 0 %
Lymphocytes Relative: 20 %
Lymphs Abs: 1.6 10*3/uL (ref 0.7–4.0)
MCH: 34.3 pg — ABNORMAL HIGH (ref 26.0–34.0)
MCHC: 31.9 g/dL (ref 30.0–36.0)
MCV: 107.5 fL — ABNORMAL HIGH (ref 80.0–100.0)
Monocytes Absolute: 0.6 10*3/uL (ref 0.1–1.0)
Monocytes Relative: 7 %
Neutro Abs: 5.1 10*3/uL (ref 1.7–7.7)
Neutrophils Relative %: 63 %
Platelets: UNDETERMINED 10*3/uL (ref 150–400)
RBC: 3.18 MIL/uL — ABNORMAL LOW (ref 4.22–5.81)
RDW: 13.7 % (ref 11.5–15.5)
WBC: 8.1 10*3/uL (ref 4.0–10.5)
nRBC: 0 % (ref 0.0–0.2)

## 2019-03-23 LAB — CBG MONITORING, ED: Glucose-Capillary: 115 mg/dL — ABNORMAL HIGH (ref 70–99)

## 2019-03-23 NOTE — ED Notes (Signed)
Pt ambulated with minimal assistance with walker.

## 2019-03-23 NOTE — Discharge Instructions (Addendum)
[name] was seen in the ER for fall  Work up today was normal  Wife Jeremiah Mccoy was contacted and updated on plan of care and discharge  Return for changes in mental status, severe headache, any stroke or mental status changes   Continue all medications as prescribed by primary care doctor and primary care team

## 2019-03-23 NOTE — ED Provider Notes (Addendum)
[place] DEPT Provider Note   CSN: [id number] Arrival date & time: 03/23/19  1218     History Chief Complaint  Patient presents with  . Fall    Jeziah Landgren is a 85 y.o. male with h/o prostate cancer s/p prostatectomy, CVA, DM on oral agents, anemia presents to ER from [facility] for evaluation of fall. History obtained from patient, triage note and EMS.  Patient states he had a "large and firm" bowel movement and as he was walking out of the bathroom he felt light headed and passed out. He reports falling to the ground but denies any injuries or pain anywhere.  States this has happened before for "months".  He is oriented to full name, hospital and events but does not know the month, year.  When asked if he usually doesn't remember the month or year he states he does not because "I don't care".    HPI     Past Medical History:  Diagnosis Date  . Cancer ([place])   . Depression   . Diabetes mellitus without complication ([place])   . Prostate cancer ([place])   . Stroke [facility])     Patient Active Problem List   Diagnosis Date Noted  . Syncope 10/16/2018  . Elevated LDL cholesterol level 05/07/2018  . Elevated TSH 05/07/2018  . Hypothyroidism 05/07/2018  . Slow transit constipation 05/07/2018  . Insomnia 02/15/2018  . Bronchitis 02/07/2018  . Type II diabetes mellitus with renal manifestations ([place]) 01/29/2018  . HLD (hyperlipidemia) 01/29/2018  . Prostate cancer ([place]) 01/29/2018  . CKD (chronic kidney disease), stage III 01/29/2018  . Dizziness 01/29/2018  . Hospital discharge follow-up 01/11/2018  . Thrombocytopenia ([place]) 01/05/2018  . AKI (acute kidney injury) ([place]) 01/05/2018  . Lightheadedness 01/04/2018  . Stroke ([place]) 01/04/2018  . Recurrent prostate cancer ([place]) 10/24/2017  . Traumatic ecchymosis of abdominal wall 10/19/2017  . Macrocytosis without anemia 07/17/2017  . Macrocytosis 07/17/2017  . GERD (gastroesophageal reflux disease)  06/28/2017  . Chest pain 06/22/2017  . Elevated PSA, less than 10 ng/ml 04/17/2017  . Type 2 diabetes mellitus with complication, with long-term current use of insulin ([place]) 03/15/2017  . Elevated cholesterol 03/15/2017  . History of CVA (cerebrovascular accident) 03/15/2017  . Non-compliance 03/15/2017  . History of prostate cancer 03/15/2017  . Continuous leakage of urine 03/15/2017  . Erectile dysfunction following prostate ablative therapy 03/15/2017    Past Surgical History:  Procedure Laterality Date  . APPENDECTOMY    . CHOLECYSTECTOMY    . PROSTATECTOMY         Family History  Problem Relation Age of Onset  . Colon cancer Mother   . Stroke Father   . Diabetes Brother   . Breast cancer Neg Hx   . Prostate cancer Neg Hx   . Pancreatic cancer Neg Hx     Social History   Tobacco Use  . Smoking status: Never Smoker  . Smokeless tobacco: Never Used  Substance Use Topics  . Alcohol use: No  . Drug use: No    Home Medications Prior to Admission medications   Medication Sig Start Date End Date Taking? Authorizing Provider  aspirin EC 81 MG tablet Take 1 tablet (81 mg total) by mouth daily. 05/07/18   Libby Maw, MD  atorvastatin (LIPITOR) 40 MG tablet TAKE 1 TABLET(40 MG) BY MOUTH DAILY AT 6 PM Patient taking differently: Take 40 mg by mouth daily at 6 PM.  01/07/19   Libby Maw, MD  metFORMIN (GLUCOPHAGE) 500 MG tablet TAKE 1 TABLET BY MOUTH TWICE DAILY WITH MEAL Patient taking differently: Take 500 mg by mouth 2 (two) times daily with a meal.  05/08/18   Libby Maw, MD  traZODone (DESYREL) 50 MG tablet TAKE 1 TABLET(50 MG) BY MOUTH AT BEDTIME AS NEEDED FOR SLEEP Patient taking differently: Take 50 mg by mouth at bedtime as needed for sleep.  01/15/19   Libby Maw, MD  vitamin B-12 1000 MCG tablet Take 1 tablet (1,000 mcg total) by mouth daily. 01/06/18   Geradine Girt, DO    Allergies    Morphine and related,  Motrin [ibuprofen], and Sulfa antibiotics  Review of Systems   Review of Systems  All other systems reviewed and are negative.   Physical Exam Updated Vital Signs BP (!) 161/90 (BP Location: Left Arm)   Pulse 78   Temp 98.3 F (36.8 C) (Oral)   Resp 18   Ht 5\' 11"  (1.803 m)   SpO2 98%   BMI 27.03 kg/m   Physical Exam Vitals and nursing note reviewed.  Constitutional:      General: He is not in acute distress.    Appearance: He is well-developed.     Comments: NAD.  HENT:     Head: Normocephalic and atraumatic.     Comments: No signs of facial/scalp trauma     Right Ear: External ear normal.     Left Ear: External ear normal.     Nose: Nose normal.  Eyes:     Conjunctiva/sclera: Conjunctivae normal.  Neck:     Comments: In cervical collar. Mild low midline tenderness. No paraspinal muscle tenderness. Patient trying to remove collar but cooperative after explanation of indication.  Trachea midline  Cardiovascular:     Rate and Rhythm: Normal rate and regular rhythm.     Heart sounds: Normal heart sounds.  Pulmonary:     Effort: Pulmonary effort is normal.     Breath sounds: Normal breath sounds.     Comments: No A/P/L chest wall tenderness  Musculoskeletal:        General: Normal range of motion.     Cervical back: Normal range of motion and neck supple.     Comments: Patient rolled with assistance of EMT to stabilize c-spine/head No midline TL spine tenderness. No paraspinal muscle tenderness. No ecchymosis on back No buttocks tenderness No TTP of bony prominence at hips, pubic symphysis bilaterally. Full ROM of hips without pain Passive ROM of upper/lower extremitise   Skin:    General: Skin is warm and dry.     Capillary Refill: Capillary refill takes less than 2 seconds.  Neurological:     Mental Status: He is alert and oriented to person, place, and time.     Comments:  Mental Status: Patient is awake, alert, oriented to person, place, events but not  month/year.  Patient is able to give a clear and coherent history.  Speech is fluent and clear without dysarthria or aphasia.  No signs of neglect.  Cranial Nerves: I not tested II visual fields no tested. PERRL.  Unable to visualize posterior eye. III, IV, VI EOMs intact without ptosis or diplopia  V sensation to light touch intact in all 3 divisions of trigeminal nerve bilaterally  VII facial movements symmetric bilaterally VIII hearing intact to voice/conversation  IX, X no uvula deviation, symmetric rise of soft palate/uvula XI 5/5 SCM and trapezius strength bilaterally  XII tongue protrusion midline, symmetric L/R movements  Motor: Strength 5/5 in upper/lower extremities .   Sensation to light touch intact in face, upper/lower extremities. No pronator drift. No leg drop.  Cerebellar: No ataxia with finger to nose. Gait not assessed on initial exam   Psychiatric:        Behavior: Behavior normal.        Thought Content: Thought content normal.        Judgment: Judgment normal.     ED Results / Procedures / Treatments   Labs (all labs ordered are listed, but only abnormal results are displayed) Labs Reviewed  CBC WITH DIFFERENTIAL/PLATELET - Abnormal; Notable for the following components:      Result Value   RBC 3.18 (*)    Hemoglobin 10.9 (*)    HCT 34.2 (*)    MCV 107.5 (*)    MCH 34.3 (*)    Eosinophils Absolute 0.7 (*)    All other components within normal limits  BASIC METABOLIC PANEL - Abnormal; Notable for the following components:   Glucose, Bld 110 (*)    Calcium 8.8 (*)    All other components within normal limits  CBG MONITORING, ED - Abnormal; Notable for the following components:   Glucose-Capillary 115 (*)    All other components within normal limits    EKG EKG Interpretation  Date/Time:  Saturday March 23 2019 12:40:04 EST Ventricular Rate:  72 PR Interval:    QRS Duration: 107 QT Interval:  411 QTC Calculation: 450 R Axis:   -43 Text  Interpretation: Sinus or ectopic atrial rhythm Left axis deviation Low voltage, precordial leads No significant change since last tracing Confirmed by Dorie Rank [phone]) on 03/23/2019 1:07:29 PM   Radiology DG Elbow Complete Right  Result Date: 03/23/2019 CLINICAL DATA:  85 year old who had a syncopal episode earlier today and had an unwitnessed fall. RIGHT elbow pain. Initial encounter. EXAM: RIGHT ELBOW - COMPLETE 3+ VIEW COMPARISON:  None. FINDINGS: No evidence of acute fracture or dislocation. Mild narrowing of the humeroulnar joint space. Well-preserved bone mineral density for age. Small olecranon spur. No posterior fat pad to suggest a joint effusion or hemarthrosis. IMPRESSION: 1. No acute osseous abnormality. 2. Mild osteoarthritis. 3. Small olecranon spur. Electronically Signed   By: Evangeline Dakin M.D.   On: 03/23/2019 15:23   CT Head Wo Contrast  Result Date: 03/23/2019 CLINICAL DATA:  Syncope with resultant fall. Dementia. EXAM: CT HEAD WITHOUT CONTRAST CT CERVICAL SPINE WITHOUT CONTRAST TECHNIQUE: Multidetector CT imaging of the head and cervical spine was performed following the standard protocol without intravenous contrast. Multiplanar CT image reconstructions of the cervical spine were also generated. COMPARISON:  CT scans dated 02/18/2019 FINDINGS: CT HEAD FINDINGS Brain: No evidence of acute infarction, hemorrhage, hydrocephalus, extra-axial collection or mass lesion/mass effect. Diffuse chronic atrophy with secondary ventricular dilatation. Extensive chronic periventricular white matter lucency consistent with small vessel ischemic disease. Old cortical infarcts in right and left posterior parietal and occipital regions. No acute abnormalities. Vascular: No hyperdense vessel or unexpected calcification. Skull: Normal. Negative for fracture or focal lesion. Sinuses/Orbits: Normal. Other: None CT CERVICAL SPINE FINDINGS Alignment: Normal. Skull base and vertebrae: No acute fracture.  No primary bone lesion. Chronic ankylosis of the left lateral masses of C3 and C4. Soft tissues and spinal canal: No prevertebral fluid or swelling. No visible canal hematoma. Disc levels: C2-3: Normal disc. Severe right facet arthritis. Slight right foraminal stenosis. C3-4: Uncinate spurs extend into the left neural foramen. Ankylosis of the left facet joint. Normal right  facet joint. Slight left foraminal narrowing. C4-5: Normal disc. Moderate right facet arthritis. Moderate right foraminal stenosis. C5-6: Small uncinate spurs extend into the left neural foramen. Moderate left facet arthritis. Severe left foraminal stenosis. C6-7: Disc degeneration. Small endplate osteophytes extend into the right neural foramen with moderately severe right foraminal stenosis. C7-T1: Negative. Upper chest: Negative. Other: None IMPRESSION: 1. No acute intracranial abnormality. Atrophy with extensive chronic small vessel ischemic disease. Old cortical infarcts. 2. No acute abnormality of the cervical spine. Multilevel degenerative disc and joint disease. Electronically Signed   By: Lorriane Shire M.D.   On: 03/23/2019 14:30   CT Cervical Spine Wo Contrast  Result Date: 03/23/2019 CLINICAL DATA:  Syncope with resultant fall. Dementia. EXAM: CT HEAD WITHOUT CONTRAST CT CERVICAL SPINE WITHOUT CONTRAST TECHNIQUE: Multidetector CT imaging of the head and cervical spine was performed following the standard protocol without intravenous contrast. Multiplanar CT image reconstructions of the cervical spine were also generated. COMPARISON:  CT scans dated 02/18/2019 FINDINGS: CT HEAD FINDINGS Brain: No evidence of acute infarction, hemorrhage, hydrocephalus, extra-axial collection or mass lesion/mass effect. Diffuse chronic atrophy with secondary ventricular dilatation. Extensive chronic periventricular white matter lucency consistent with small vessel ischemic disease. Old cortical infarcts in right and left posterior parietal and  occipital regions. No acute abnormalities. Vascular: No hyperdense vessel or unexpected calcification. Skull: Normal. Negative for fracture or focal lesion. Sinuses/Orbits: Normal. Other: None CT CERVICAL SPINE FINDINGS Alignment: Normal. Skull base and vertebrae: No acute fracture. No primary bone lesion. Chronic ankylosis of the left lateral masses of C3 and C4. Soft tissues and spinal canal: No prevertebral fluid or swelling. No visible canal hematoma. Disc levels: C2-3: Normal disc. Severe right facet arthritis. Slight right foraminal stenosis. C3-4: Uncinate spurs extend into the left neural foramen. Ankylosis of the left facet joint. Normal right facet joint. Slight left foraminal narrowing. C4-5: Normal disc. Moderate right facet arthritis. Moderate right foraminal stenosis. C5-6: Small uncinate spurs extend into the left neural foramen. Moderate left facet arthritis. Severe left foraminal stenosis. C6-7: Disc degeneration. Small endplate osteophytes extend into the right neural foramen with moderately severe right foraminal stenosis. C7-T1: Negative. Upper chest: Negative. Other: None IMPRESSION: 1. No acute intracranial abnormality. Atrophy with extensive chronic small vessel ischemic disease. Old cortical infarcts. 2. No acute abnormality of the cervical spine. Multilevel degenerative disc and joint disease. Electronically Signed   By: Lorriane Shire M.D.   On: 03/23/2019 14:30    Procedures Procedures (including critical care time)  Medications Ordered in ED Medications - No data to display  ED Course  I have reviewed the triage vital signs and the nursing notes.  Pertinent labs & imaging results that were available during my care of the patient were reviewed by me and considered in my medical decision making (see chart for details).  Clinical Course as of Mar 22 1616  Sat Mar 23, 2019  1539 Spoke to staff at [facility] regarding events leading up to ER visit. Staff heard a scream, walked  into patients room and saw him on the ground with his head up against the wall. Patient told staff he was trying to plug something on the wall and fell.  No witnessed LOC, vomiting. Mental status unchanged per staff after incident.  Legal guarding is wife [place]. Staff states pt has been at baseline the last few days without any medical issues or events at facility    [CG]    Clinical Course User Index [CG] Kinnie Feil, PA-C  MDM Rules/Calculators/A&P                      History obtained from staff at [facility] as above  Exam benign, no signs or symptoms to point to significant physical injury after fall on my exam.  Unreliable exam given possible dementia.  Head CT/cervical CT negative.  Patient reported right elbow pain to EDP, x-rays obtained which were non acute.   EKG and screening labs personally reviewed and unremarkable.   Patient re-evaluated and no clinical decline.  Patient ambulatory at his baseline here. Tolerating PO.   Plan to DC back to [place].    Spoke to wife regarding POC, she is in agreement  Shared with EDP.  [id number]: RN notified me EMT unable to walk patient due to "unstable" and weak.  I personally ambulated patient and he stood up and ambulated with a walker without assistance in the ER.  Reports feeling good but does not want to go back to [facility] facility and would prefer to go home.  Vital signs stable.  Tolerating p.o.  Approved for discharge. Final Clinical Impression(s) / ED Diagnoses Final diagnoses:  Fall, initial encounter  Right elbow pain    Rx / DC Orders ED Discharge Orders    None         Arlean Hopping 03/23/19 1618    Dorie Rank, MD 03/24/19 1205

## 2019-03-23 NOTE — ED Triage Notes (Signed)
Pt arrives [facility] from [place]. Per EMS: Pt was straining to have a BM this morning and after standing had a syncopal episode. Pt was found on floor. No blood thinners noted. Pt arrives in C-collar. Pt has some dementia at baseline.

## 2019-03-23 NOTE — ED Notes (Addendum)
Pt attempting to remove collar. Collar placed back on pt. Pt instructed not to remove collar. Pt verbalizes understanding.

## 2019-03-23 NOTE — ED Notes (Signed)
Ambulated pt, pt attempted to walk but couldn't due to pt was unstable and felt as if he was going to fall from being weak.

## 2019-03-25 DIAGNOSIS — R2689 Other abnormalities of gait and mobility: Secondary | ICD-10-CM | POA: Diagnosis not present

## 2019-03-25 DIAGNOSIS — E782 Mixed hyperlipidemia: Secondary | ICD-10-CM | POA: Diagnosis not present

## 2019-03-25 DIAGNOSIS — E1122 Type 2 diabetes mellitus with diabetic chronic kidney disease: Secondary | ICD-10-CM | POA: Diagnosis not present

## 2019-03-25 DIAGNOSIS — D631 Anemia in chronic kidney disease: Secondary | ICD-10-CM | POA: Diagnosis not present

## 2019-03-25 DIAGNOSIS — N183 Chronic kidney disease, stage 3 unspecified: Secondary | ICD-10-CM | POA: Diagnosis not present

## 2019-03-25 DIAGNOSIS — I639 Cerebral infarction, unspecified: Secondary | ICD-10-CM | POA: Diagnosis not present

## 2019-03-25 DIAGNOSIS — F331 Major depressive disorder, recurrent, moderate: Secondary | ICD-10-CM | POA: Diagnosis not present

## 2019-03-25 DIAGNOSIS — I69398 Other sequelae of cerebral infarction: Secondary | ICD-10-CM | POA: Diagnosis not present

## 2019-03-26 DIAGNOSIS — N183 Chronic kidney disease, stage 3 unspecified: Secondary | ICD-10-CM | POA: Diagnosis not present

## 2019-03-26 DIAGNOSIS — E782 Mixed hyperlipidemia: Secondary | ICD-10-CM | POA: Diagnosis not present

## 2019-03-26 DIAGNOSIS — D631 Anemia in chronic kidney disease: Secondary | ICD-10-CM | POA: Diagnosis not present

## 2019-03-26 DIAGNOSIS — I69398 Other sequelae of cerebral infarction: Secondary | ICD-10-CM | POA: Diagnosis not present

## 2019-03-26 DIAGNOSIS — R2689 Other abnormalities of gait and mobility: Secondary | ICD-10-CM | POA: Diagnosis not present

## 2019-03-26 DIAGNOSIS — E1122 Type 2 diabetes mellitus with diabetic chronic kidney disease: Secondary | ICD-10-CM | POA: Diagnosis not present

## 2019-03-27 DIAGNOSIS — E1122 Type 2 diabetes mellitus with diabetic chronic kidney disease: Secondary | ICD-10-CM | POA: Diagnosis not present

## 2019-03-27 DIAGNOSIS — E782 Mixed hyperlipidemia: Secondary | ICD-10-CM | POA: Diagnosis not present

## 2019-03-27 DIAGNOSIS — N183 Chronic kidney disease, stage 3 unspecified: Secondary | ICD-10-CM | POA: Diagnosis not present

## 2019-03-27 DIAGNOSIS — D631 Anemia in chronic kidney disease: Secondary | ICD-10-CM | POA: Diagnosis not present

## 2019-03-27 DIAGNOSIS — R2689 Other abnormalities of gait and mobility: Secondary | ICD-10-CM | POA: Diagnosis not present

## 2019-03-27 DIAGNOSIS — I69398 Other sequelae of cerebral infarction: Secondary | ICD-10-CM | POA: Diagnosis not present

## 2019-03-28 DIAGNOSIS — R2689 Other abnormalities of gait and mobility: Secondary | ICD-10-CM | POA: Diagnosis not present

## 2019-03-28 DIAGNOSIS — M25552 Pain in left hip: Secondary | ICD-10-CM | POA: Diagnosis not present

## 2019-03-28 DIAGNOSIS — E782 Mixed hyperlipidemia: Secondary | ICD-10-CM | POA: Diagnosis not present

## 2019-03-28 DIAGNOSIS — R52 Pain, unspecified: Secondary | ICD-10-CM | POA: Diagnosis not present

## 2019-03-28 DIAGNOSIS — I69398 Other sequelae of cerebral infarction: Secondary | ICD-10-CM | POA: Diagnosis not present

## 2019-03-28 DIAGNOSIS — E1122 Type 2 diabetes mellitus with diabetic chronic kidney disease: Secondary | ICD-10-CM | POA: Diagnosis not present

## 2019-03-28 DIAGNOSIS — N183 Chronic kidney disease, stage 3 unspecified: Secondary | ICD-10-CM | POA: Diagnosis not present

## 2019-03-28 DIAGNOSIS — D631 Anemia in chronic kidney disease: Secondary | ICD-10-CM | POA: Diagnosis not present

## 2019-03-30 DIAGNOSIS — Z7984 Long term (current) use of oral hypoglycemic drugs: Secondary | ICD-10-CM | POA: Diagnosis not present

## 2019-03-30 DIAGNOSIS — Z9181 History of falling: Secondary | ICD-10-CM | POA: Diagnosis not present

## 2019-03-30 DIAGNOSIS — E1122 Type 2 diabetes mellitus with diabetic chronic kidney disease: Secondary | ICD-10-CM | POA: Diagnosis not present

## 2019-03-30 DIAGNOSIS — D631 Anemia in chronic kidney disease: Secondary | ICD-10-CM | POA: Diagnosis not present

## 2019-03-30 DIAGNOSIS — F329 Major depressive disorder, single episode, unspecified: Secondary | ICD-10-CM | POA: Diagnosis not present

## 2019-03-30 DIAGNOSIS — E039 Hypothyroidism, unspecified: Secondary | ICD-10-CM | POA: Diagnosis not present

## 2019-03-30 DIAGNOSIS — R2689 Other abnormalities of gait and mobility: Secondary | ICD-10-CM | POA: Diagnosis not present

## 2019-03-30 DIAGNOSIS — D519 Vitamin B12 deficiency anemia, unspecified: Secondary | ICD-10-CM | POA: Diagnosis not present

## 2019-03-30 DIAGNOSIS — I69398 Other sequelae of cerebral infarction: Secondary | ICD-10-CM | POA: Diagnosis not present

## 2019-03-30 DIAGNOSIS — N183 Chronic kidney disease, stage 3 unspecified: Secondary | ICD-10-CM | POA: Diagnosis not present

## 2019-03-30 DIAGNOSIS — F5101 Primary insomnia: Secondary | ICD-10-CM | POA: Diagnosis not present

## 2019-03-30 DIAGNOSIS — Z7982 Long term (current) use of aspirin: Secondary | ICD-10-CM | POA: Diagnosis not present

## 2019-03-30 DIAGNOSIS — E782 Mixed hyperlipidemia: Secondary | ICD-10-CM | POA: Diagnosis not present

## 2019-04-01 DIAGNOSIS — N183 Chronic kidney disease, stage 3 unspecified: Secondary | ICD-10-CM | POA: Diagnosis not present

## 2019-04-01 DIAGNOSIS — R634 Abnormal weight loss: Secondary | ICD-10-CM | POA: Diagnosis not present

## 2019-04-01 DIAGNOSIS — Z7984 Long term (current) use of oral hypoglycemic drugs: Secondary | ICD-10-CM | POA: Diagnosis not present

## 2019-04-01 DIAGNOSIS — E782 Mixed hyperlipidemia: Secondary | ICD-10-CM | POA: Diagnosis not present

## 2019-04-01 DIAGNOSIS — R630 Anorexia: Secondary | ICD-10-CM | POA: Diagnosis not present

## 2019-04-01 DIAGNOSIS — D631 Anemia in chronic kidney disease: Secondary | ICD-10-CM | POA: Diagnosis not present

## 2019-04-01 DIAGNOSIS — I69398 Other sequelae of cerebral infarction: Secondary | ICD-10-CM | POA: Diagnosis not present

## 2019-04-01 DIAGNOSIS — E119 Type 2 diabetes mellitus without complications: Secondary | ICD-10-CM | POA: Diagnosis not present

## 2019-04-01 DIAGNOSIS — R2689 Other abnormalities of gait and mobility: Secondary | ICD-10-CM | POA: Diagnosis not present

## 2019-04-01 DIAGNOSIS — Z7982 Long term (current) use of aspirin: Secondary | ICD-10-CM | POA: Diagnosis not present

## 2019-04-01 DIAGNOSIS — W19XXXA Unspecified fall, initial encounter: Secondary | ICD-10-CM | POA: Diagnosis not present

## 2019-04-01 DIAGNOSIS — E1122 Type 2 diabetes mellitus with diabetic chronic kidney disease: Secondary | ICD-10-CM | POA: Diagnosis not present

## 2019-04-01 DIAGNOSIS — Z713 Dietary counseling and surveillance: Secondary | ICD-10-CM | POA: Diagnosis not present

## 2019-04-02 DIAGNOSIS — F331 Major depressive disorder, recurrent, moderate: Secondary | ICD-10-CM | POA: Diagnosis not present

## 2019-04-03 DIAGNOSIS — E1122 Type 2 diabetes mellitus with diabetic chronic kidney disease: Secondary | ICD-10-CM | POA: Diagnosis not present

## 2019-04-03 DIAGNOSIS — R2689 Other abnormalities of gait and mobility: Secondary | ICD-10-CM | POA: Diagnosis not present

## 2019-04-03 DIAGNOSIS — N183 Chronic kidney disease, stage 3 unspecified: Secondary | ICD-10-CM | POA: Diagnosis not present

## 2019-04-03 DIAGNOSIS — D631 Anemia in chronic kidney disease: Secondary | ICD-10-CM | POA: Diagnosis not present

## 2019-04-03 DIAGNOSIS — I69398 Other sequelae of cerebral infarction: Secondary | ICD-10-CM | POA: Diagnosis not present

## 2019-04-03 DIAGNOSIS — E782 Mixed hyperlipidemia: Secondary | ICD-10-CM | POA: Diagnosis not present

## 2019-04-04 ENCOUNTER — Emergency Department (HOSPITAL_COMMUNITY): Payer: Medicare Other

## 2019-04-04 ENCOUNTER — Encounter (HOSPITAL_COMMUNITY): Payer: Self-pay | Admitting: Radiology

## 2019-04-04 ENCOUNTER — Other Ambulatory Visit: Payer: Self-pay

## 2019-04-04 ENCOUNTER — Other Ambulatory Visit: Payer: Self-pay | Admitting: Family Medicine

## 2019-04-04 ENCOUNTER — Emergency Department (HOSPITAL_COMMUNITY)
Admission: EM | Admit: 2019-04-04 | Discharge: 2019-04-04 | Disposition: A | Discharge status: Home / Self Care | Payer: Medicare Other | Attending: Emergency Medicine | Admitting: Emergency Medicine

## 2019-04-04 DIAGNOSIS — R279 Unspecified lack of coordination: Secondary | ICD-10-CM | POA: Diagnosis not present

## 2019-04-04 DIAGNOSIS — M7989 Other specified soft tissue disorders: Secondary | ICD-10-CM | POA: Diagnosis not present

## 2019-04-04 DIAGNOSIS — R4182 Altered mental status, unspecified: Secondary | ICD-10-CM | POA: Diagnosis not present

## 2019-04-04 DIAGNOSIS — F0390 Unspecified dementia without behavioral disturbance: Secondary | ICD-10-CM | POA: Diagnosis not present

## 2019-04-04 DIAGNOSIS — E785 Hyperlipidemia, unspecified: Secondary | ICD-10-CM

## 2019-04-04 DIAGNOSIS — E1122 Type 2 diabetes mellitus with diabetic chronic kidney disease: Secondary | ICD-10-CM | POA: Diagnosis not present

## 2019-04-04 DIAGNOSIS — Z743 Need for continuous supervision: Secondary | ICD-10-CM | POA: Diagnosis not present

## 2019-04-04 DIAGNOSIS — Y9289 Other specified places as the place of occurrence of the external cause: Secondary | ICD-10-CM | POA: Insufficient documentation

## 2019-04-04 DIAGNOSIS — W1839XA Other fall on same level, initial encounter: Secondary | ICD-10-CM | POA: Insufficient documentation

## 2019-04-04 DIAGNOSIS — R5381 Other malaise: Secondary | ICD-10-CM | POA: Diagnosis not present

## 2019-04-04 DIAGNOSIS — Z8673 Personal history of transient ischemic attack (TIA), and cerebral infarction without residual deficits: Secondary | ICD-10-CM | POA: Insufficient documentation

## 2019-04-04 DIAGNOSIS — Y9389 Activity, other specified: Secondary | ICD-10-CM | POA: Insufficient documentation

## 2019-04-04 DIAGNOSIS — Z8546 Personal history of malignant neoplasm of prostate: Secondary | ICD-10-CM | POA: Diagnosis not present

## 2019-04-04 DIAGNOSIS — M79671 Pain in right foot: Secondary | ICD-10-CM | POA: Diagnosis not present

## 2019-04-04 DIAGNOSIS — R600 Localized edema: Secondary | ICD-10-CM | POA: Diagnosis not present

## 2019-04-04 DIAGNOSIS — Z7984 Long term (current) use of oral hypoglycemic drugs: Secondary | ICD-10-CM | POA: Diagnosis not present

## 2019-04-04 DIAGNOSIS — E039 Hypothyroidism, unspecified: Secondary | ICD-10-CM | POA: Insufficient documentation

## 2019-04-04 DIAGNOSIS — Z79899 Other long term (current) drug therapy: Secondary | ICD-10-CM | POA: Insufficient documentation

## 2019-04-04 DIAGNOSIS — N183 Chronic kidney disease, stage 3 unspecified: Secondary | ICD-10-CM | POA: Insufficient documentation

## 2019-04-04 DIAGNOSIS — M79672 Pain in left foot: Secondary | ICD-10-CM | POA: Diagnosis not present

## 2019-04-04 DIAGNOSIS — R52 Pain, unspecified: Secondary | ICD-10-CM | POA: Diagnosis not present

## 2019-04-04 DIAGNOSIS — W19XXXA Unspecified fall, initial encounter: Secondary | ICD-10-CM

## 2019-04-04 DIAGNOSIS — Y999 Unspecified external cause status: Secondary | ICD-10-CM | POA: Insufficient documentation

## 2019-04-04 DIAGNOSIS — R519 Headache, unspecified: Secondary | ICD-10-CM | POA: Diagnosis not present

## 2019-04-04 DIAGNOSIS — S32040A Wedge compression fracture of fourth lumbar vertebra, initial encounter for closed fracture: Secondary | ICD-10-CM | POA: Insufficient documentation

## 2019-04-04 DIAGNOSIS — I10 Essential (primary) hypertension: Secondary | ICD-10-CM | POA: Diagnosis not present

## 2019-04-04 DIAGNOSIS — R296 Repeated falls: Secondary | ICD-10-CM | POA: Diagnosis not present

## 2019-04-04 DIAGNOSIS — Z7982 Long term (current) use of aspirin: Secondary | ICD-10-CM | POA: Insufficient documentation

## 2019-04-04 DIAGNOSIS — S3992XA Unspecified injury of lower back, initial encounter: Secondary | ICD-10-CM | POA: Diagnosis not present

## 2019-04-04 LAB — COMPREHENSIVE METABOLIC PANEL
ALT: 28 U/L (ref 0–44)
AST: 28 U/L (ref 15–41)
Albumin: 3.1 g/dL — ABNORMAL LOW (ref 3.5–5.0)
Alkaline Phosphatase: 59 U/L (ref 38–126)
Anion gap: 9 (ref 5–15)
BUN: 22 mg/dL (ref 8–23)
CO2: 24 mmol/L (ref 22–32)
Calcium: 8.8 mg/dL — ABNORMAL LOW (ref 8.9–10.3)
Chloride: 107 mmol/L (ref 98–111)
Creatinine, Ser: 1.04 mg/dL (ref 0.61–1.24)
GFR calc Af Amer: >60 mL/min (ref >60)
GFR calc non Af Amer: >60 mL/min (ref >60)
Glucose, Bld: 112 mg/dL — ABNORMAL HIGH (ref 70–99)
Potassium: 4 mmol/L (ref 3.5–5.1)
Sodium: 140 mmol/L (ref 135–145)
Total Bilirubin: 0.5 mg/dL (ref 0.3–1.2)
Total Protein: 6.1 g/dL — ABNORMAL LOW (ref 6.5–8.1)

## 2019-04-04 LAB — CBC WITH DIFFERENTIAL/PLATELET
Abs Immature Granulocytes: 0.04 10*3/uL (ref 0.00–0.07)
Basophils Absolute: 0.1 10*3/uL (ref 0.0–0.1)
Basophils Relative: 1 %
Eosinophils Absolute: 0.7 10*3/uL — ABNORMAL HIGH (ref 0.0–0.5)
Eosinophils Relative: 9 %
HCT: 34.3 % — ABNORMAL LOW (ref 39.0–52.0)
Hemoglobin: 11 g/dL — ABNORMAL LOW (ref 13.0–17.0)
Immature Granulocytes: 1 %
Lymphocytes Relative: 19 %
Lymphs Abs: 1.5 10*3/uL (ref 0.7–4.0)
MCH: 33.8 pg (ref 26.0–34.0)
MCHC: 32.1 g/dL (ref 30.0–36.0)
MCV: 105.5 fL — ABNORMAL HIGH (ref 80.0–100.0)
Monocytes Absolute: 0.5 10*3/uL (ref 0.1–1.0)
Monocytes Relative: 7 %
Neutro Abs: 5.1 10*3/uL (ref 1.7–7.7)
Neutrophils Relative %: 63 %
Platelets: 149 10*3/uL — ABNORMAL LOW (ref 150–400)
RBC: 3.25 MIL/uL — ABNORMAL LOW (ref 4.22–5.81)
RDW: 13.2 % (ref 11.5–15.5)
WBC: 7.9 10*3/uL (ref 4.0–10.5)
nRBC: 0 % (ref 0.0–0.2)

## 2019-04-04 MED ORDER — ACETAMINOPHEN 325 MG PO TABS
650.0000 mg | ORAL_TABLET | Freq: Once | ORAL | Status: AC
  Administered 2019-04-04: 650 mg via ORAL
  Filled 2019-04-04: qty 2

## 2019-04-04 NOTE — ED Notes (Signed)
Pt provided with lunch tray.

## 2019-04-04 NOTE — ED Provider Notes (Addendum)
[facility] DEPT Provider Note   CSN: [id number] Arrival date & time: 04/04/19  0956  LEVEL 5 CAVEAT - DEMENTIA  History No chief complaint on file.   Jeremiah Mccoy is a 86 y.o. male.  HPI  86 year old male presents after a fall.  He was walking without his walker and fell.  He tells me that he was trying to make a peanut butter jelly sandwich and fell.  He currently complains of sticky fingers from the jelly but also has some mild headache when asked and low back pain.  EMS also notes lower extremity swelling of unclear duration but no leg pain. No chest pain. Patient is supposed to use a walker due to instability with gait from a stroke but does not use it.  Past Medical History:  Diagnosis Date  . Cancer ([place])   . Depression   . Diabetes mellitus without complication ([place])   . Prostate cancer ([place])   . Stroke [facility])     Patient Active Problem List   Diagnosis Date Noted  . Syncope 10/16/2018  . Elevated LDL cholesterol level 05/07/2018  . Elevated TSH 05/07/2018  . Hypothyroidism 05/07/2018  . Slow transit constipation 05/07/2018  . Insomnia 02/15/2018  . Bronchitis 02/07/2018  . Type II diabetes mellitus with renal manifestations ([place]) 01/29/2018  . HLD (hyperlipidemia) 01/29/2018  . Prostate cancer ([place]) 01/29/2018  . CKD (chronic kidney disease), stage III 01/29/2018  . Dizziness 01/29/2018  . Hospital discharge follow-up 01/11/2018  . Thrombocytopenia ([place]) 01/05/2018  . AKI (acute kidney injury) ([place]) 01/05/2018  . Lightheadedness 01/04/2018  . Stroke ([place]) 01/04/2018  . Recurrent prostate cancer ([place]) 10/24/2017  . Traumatic ecchymosis of abdominal wall 10/19/2017  . Macrocytosis without anemia 07/17/2017  . Macrocytosis 07/17/2017  . GERD (gastroesophageal reflux disease) 06/28/2017  . Chest pain 06/22/2017  . Elevated PSA, less than 10 ng/ml 04/17/2017  . Type 2 diabetes mellitus with complication, with long-term current use  of insulin ([place]) 03/15/2017  . Elevated cholesterol 03/15/2017  . History of CVA (cerebrovascular accident) 03/15/2017  . Non-compliance 03/15/2017  . History of prostate cancer 03/15/2017  . Continuous leakage of urine 03/15/2017  . Erectile dysfunction following prostate ablative therapy 03/15/2017    Past Surgical History:  Procedure Laterality Date  . APPENDECTOMY    . CHOLECYSTECTOMY    . PROSTATECTOMY         Family History  Problem Relation Age of Onset  . Colon cancer Mother   . Stroke Father   . Diabetes Brother   . Breast cancer Neg Hx   . Prostate cancer Neg Hx   . Pancreatic cancer Neg Hx     Social History   Tobacco Use  . Smoking status: Never Smoker  . Smokeless tobacco: Never Used  Substance Use Topics  . Alcohol use: No  . Drug use: No    Home Medications Prior to Admission medications   Medication Sig Start Date End Date Taking? Authorizing Provider  aspirin EC 81 MG tablet Take 1 tablet (81 mg total) by mouth daily. 05/07/18  Yes Libby Maw, MD  atorvastatin (LIPITOR) 40 MG tablet TAKE 1 TABLET(40 MG) BY MOUTH DAILY AT 6 PM Patient taking differently: Take 40 mg by mouth daily at 6 PM.  01/07/19  Yes Libby Maw, MD  Ensure (ENSURE) Take 237 mLs by mouth 3 (three) times daily.   Yes [provider]  LORazepam (ATIVAN) 0.5 MG tablet Take 0.5 mg by mouth 2 (  two) times daily as needed. 03/25/19  Yes [provider]  metFORMIN (GLUCOPHAGE) 500 MG tablet TAKE 1 TABLET BY MOUTH TWICE DAILY WITH MEAL Patient taking differently: Take 500 mg by mouth 2 (two) times daily with a meal.  05/08/18  Yes Libby Maw, MD  mirtazapine (REMERON) 15 MG tablet Take 15 mg by mouth at bedtime. 04/01/19  Yes [provider]  sertraline (ZOLOFT) 25 MG tablet Take 25 mg by mouth daily. 03/25/19  Yes [provider]  Skin Protectants, Misc. (BAZA PROTECT EX) Apply 1 application topically 3 (three) times daily.  Apply to buttocks for redness   Yes [provider]  traZODone (DESYREL) 50 MG tablet TAKE 1 TABLET(50 MG) BY MOUTH AT BEDTIME AS NEEDED FOR SLEEP Patient taking differently: Take 50 mg by mouth at bedtime.  01/15/19  Yes Libby Maw, MD  vitamin B-12 1000 MCG tablet Take 1 tablet (1,000 mcg total) by mouth daily. 01/06/18  Yes Eulogio Bear U, DO    Allergies    Morphine and related, Motrin [ibuprofen], and Sulfa antibiotics  Review of Systems   Review of Systems  Unable to perform ROS: Dementia    Physical Exam Updated Vital Signs BP 138/82 (BP Location: Left Arm)   Pulse 71   Temp 98.5 F (36.9 C) (Oral)   Resp 16   SpO2 100%   Physical Exam Vitals and nursing note reviewed.  Constitutional:      General: He is not in acute distress.    Appearance: He is well-developed. He is not ill-appearing or diaphoretic.  HENT:     Head: Normocephalic and atraumatic.     Comments: No obvious external head trauma    Right Ear: External ear normal.     Left Ear: External ear normal.     Nose: Nose normal.  Eyes:     General:        Right eye: No discharge.        Left eye: No discharge.  Cardiovascular:     Rate and Rhythm: Normal rate and regular rhythm.     Heart sounds: Normal heart sounds.  Pulmonary:     Effort: Pulmonary effort is normal.     Breath sounds: Normal breath sounds.  Chest:     Chest wall: No tenderness.  Abdominal:     General: There is no distension.     Palpations: Abdomen is soft.     Tenderness: There is no abdominal tenderness.  Musculoskeletal:     Cervical back: Neck supple. No tenderness.     Thoracic back: No tenderness.     Lumbar back: Tenderness (mild) present.     Right lower leg: Edema present.     Left lower leg: Edema present.     Comments: Mild lower extremity edema. No tenderness or decreased ROM to bilateral hips, thighs, knees, lower legs/feet  Skin:    General: Skin is warm and dry.  Neurological:     Mental  Status: He is alert.     Comments: Equal strength in all 4 extremities. No slurred speech.  Psychiatric:        Mood and Affect: Mood is not anxious.     ED Results / Procedures / Treatments   Labs (all labs ordered are listed, but only abnormal results are displayed) Labs Reviewed  COMPREHENSIVE METABOLIC PANEL - Abnormal; Notable for the following components:      Result Value   Glucose, Bld 112 (*)  Calcium 8.8 (*)    Total Protein 6.1 (*)    Albumin 3.1 (*)    All other components within normal limits  CBC WITH DIFFERENTIAL/PLATELET - Abnormal; Notable for the following components:   RBC 3.25 (*)    Hemoglobin 11.0 (*)    HCT 34.3 (*)    MCV 105.5 (*)    Platelets 149 (*)    Eosinophils Absolute 0.7 (*)    All other components within normal limits    EKG EKG Interpretation  Date/Time:  Thursday April 04 2019 11:01:33 EST Ventricular Rate:  62 PR Interval:    QRS Duration: 90 QT Interval:  422 QTC Calculation: 428 R Axis:   20 Text Interpretation: Sinus rhythm with artifact Abnormal ECG similar to Dec 2020 Confirmed by Sherwood Gambler [id number]) on 04/04/2019 11:08:15 AM   Radiology DG Lumbar Spine Complete  Result Date: 04/04/2019 CLINICAL DATA:  Fall, back injury EXAM: LUMBAR SPINE - COMPLETE 4+ VIEW COMPARISON:  02/18/2019 FINDINGS: Mild compression deformity of the L4 vertebral body, new since prior study. No subluxation. Degenerative disc and facet disease diffusely throughout the lumbar spine. IMPRESSION: Mild compression deformity at L4, new since prior study. Electronically Signed   By: Rolm Baptise M.D.   On: 04/04/2019 11:14   CT Head Wo Contrast  Result Date: 04/04/2019 CLINICAL DATA:  86 year old male with multiple recent falls. Found down this morning. EXAM: CT HEAD WITHOUT CONTRAST TECHNIQUE: Contiguous axial images were obtained from the base of the skull through the vertex without intravenous contrast. COMPARISON:  Head CT 03/23/2019. Brain MRI  02/19/2019. FINDINGS: Brain: Chronic encephalomalacia in the posterior left MCA, posterior right right MCA and right PCA territories. Chronic patchy and confluent bilateral cerebral white matter hypodensity. Small chronic right cerebellar infarcts. Stable gray-white matter differentiation throughout the brain. No midline shift, ventriculomegaly, mass effect, evidence of mass lesion, intracranial hemorrhage or evidence of cortically based acute infarction. Vascular: Calcified atherosclerosis at the skull base. No suspicious intracranial vascular hyperdensity. Skull: No acute osseous abnormality identified. Sinuses/Orbits: Visualized paranasal sinuses and mastoids are stable and well pneumatized. Other: No acute orbit or scalp soft tissue findings. IMPRESSION: 1. No acute intracranial abnormality. No acute traumatic injury identified. 2. Stable non contrast CT appearance of advanced chronic ischemic disease. Electronically Signed   By: Genevie Ann M.D.   On: 04/04/2019 10:59   CT Cervical Spine Wo Contrast  Result Date: 04/04/2019 CLINICAL DATA:  Multiple falls EXAM: CT CERVICAL SPINE WITHOUT CONTRAST TECHNIQUE: Multidetector CT imaging of the cervical spine was performed without intravenous contrast. Multiplanar CT image reconstructions were also generated. COMPARISON:  03/23/2019 FINDINGS: Alignment: No subluxation Skull base and vertebrae: No acute fracture. No primary bone lesion or focal pathologic process. Soft tissues and spinal canal: No prevertebral fluid or swelling. No visible canal hematoma. Disc levels: Diffuse degenerative disc and advanced diffuse degenerative facet disease bilaterally. Upper chest: Old healed left posterior rib fractures. No acute findings. Other: Carotid artery calcifications. IMPRESSION: Cervical spondylosis.  No acute bony abnormality. Electronically Signed   By: Rolm Baptise M.D.   On: 04/04/2019 11:00   DG Foot Complete Left  Result Date: 04/04/2019 CLINICAL DATA:  Left foot  pain EXAM: LEFT FOOT - COMPLETE 3+ VIEW COMPARISON:  None. FINDINGS: There is no evidence of fracture or dislocation. Mild degenerative changes including the IP joint of the great toe and D IP joints throughout the foot. There is a chronic healed fracture deformity of the third metatarsal neck. Tiny plantar calcaneal spur.  Mild soft tissue swelling over the dorsum of the forefoot. IMPRESSION: 1. No acute osseous abnormality, left foot. 2. Mild dorsal forefoot soft tissue swelling, nonspecific. Electronically Signed   By: Davina Poke D.O.   On: 04/04/2019 13:31   DG Foot Complete Right  Result Date: 04/04/2019 CLINICAL DATA:  Foot pain EXAM: RIGHT FOOT COMPLETE - 3+ VIEW COMPARISON:  None. FINDINGS: There is no evidence of fracture or dislocation. Mild degenerative changes at the first MTP joint and IP joint of the great toe. No erosive changes are identified. There is mild dorsal soft tissue swelling at the forefoot. IMPRESSION: 1. No acute osseous abnormality, right foot. 2. Mild dorsal forefoot soft tissue swelling, nonspecific. Electronically Signed   By: Davina Poke D.O.   On: 04/04/2019 13:29    Procedures Procedures (including critical care time)  Medications Ordered in ED Medications  acetaminophen (TYLENOL) tablet 650 mg (650 mg Oral Given 04/04/19 1121)    ED Course  I have reviewed the triage vital signs and the nursing notes.  Pertinent labs & imaging results that were available during my care of the patient were reviewed by me and considered in my medical decision making (see chart for details).    MDM Rules/Calculators/A&P                      Patient has stable vital signs in the ED.  Labs are overall reassuring besides some mild hypoalbuminemia which is probably contributing to the lower extremity edema.  As for acute trauma, there is an age-indeterminate L4 compression fracture that is mild.  Hard to localize tenderness but given his complaints of back pain this is  probably acute.  However he has no weakness in his extremities or other signs of acute neurologic compromise.  Head CT and cervical spine CT negative.  I discussed with the wife who indicates that he was complaining of a lot of foot pain after the injury so foot x-rays were ordered though they remain nontraumatic on exam.  These are negative.  Will discharge back to his facility.  Of note there was a question of the patient needing a psychiatry consult for falling and his dementia but he has been very calm here and I do not think he needs emergent psychiatry. Final Clinical Impression(s) / ED Diagnoses Final diagnoses:  Fall, initial encounter  Bilateral lower extremity edema  Closed compression fracture of L4 vertebra, initial encounter [facility])    Rx / DC Orders ED Discharge Orders    None       Sherwood Gambler, MD 04/04/19 1414  Addendum: [facility] is requesting psychiatry consult. They feel he is falling on purpose, not eating on purpose and doing other activities to get him out of the nursing home. They are worried he is trying to kill himself. I discussed we can consult psych but he may still be discharged.    Sherwood Gambler, MD 04/04/19 1517

## 2019-04-04 NOTE — ED Notes (Signed)
Patient transported to CT 

## 2019-04-04 NOTE — ED Notes (Signed)
RN attempted to walk patient to bathroom. Patient was very unsteady on his feet and felt scared of falling. Patient made it to and from bathroom with 2 person assist without incident. Patient is normally to use a walker.  MD made aware of ambulation.

## 2019-04-04 NOTE — ED Triage Notes (Signed)
Pt BIBA from [facility]-   Per EMS- Staff reports pt has had multiple falls within last week, reportedly noncompliance with walker. Pt was found this AM after unwitnessed fall.  Pt c/o right leg pain, lower back pain, headache pain.   CBG 184 162/76 HR 62  R 18 98% RA

## 2019-04-04 NOTE — ED Notes (Signed)
RN tried to call report to the facility x2.  On the second call to the facility, the staff stated that they expected him to have a psychiatric eval. RN attempted to explain that patient had been calm and cooperative and eaten his lunch but the staff report patient is intentionally harming himself in an attempt to get out of the nursing home.  MD reports he has already spoken to facility.

## 2019-04-04 NOTE — ED Notes (Signed)
Per Diamantina Monks at [facility] [phone], patient needs a psych eval prior to returning to facility-states his behavior and frequent falls warrants an evaluation

## 2019-04-08 DIAGNOSIS — E782 Mixed hyperlipidemia: Secondary | ICD-10-CM | POA: Diagnosis not present

## 2019-04-08 DIAGNOSIS — R2689 Other abnormalities of gait and mobility: Secondary | ICD-10-CM | POA: Diagnosis not present

## 2019-04-08 DIAGNOSIS — Z23 Encounter for immunization: Secondary | ICD-10-CM | POA: Diagnosis not present

## 2019-04-08 DIAGNOSIS — E1122 Type 2 diabetes mellitus with diabetic chronic kidney disease: Secondary | ICD-10-CM | POA: Diagnosis not present

## 2019-04-08 DIAGNOSIS — I69398 Other sequelae of cerebral infarction: Secondary | ICD-10-CM | POA: Diagnosis not present

## 2019-04-08 DIAGNOSIS — D631 Anemia in chronic kidney disease: Secondary | ICD-10-CM | POA: Diagnosis not present

## 2019-04-08 DIAGNOSIS — N183 Chronic kidney disease, stage 3 unspecified: Secondary | ICD-10-CM | POA: Diagnosis not present

## 2019-04-09 ENCOUNTER — Other Ambulatory Visit: Payer: Self-pay

## 2019-04-09 ENCOUNTER — Ambulatory Visit: Payer: Medicare Other | Admitting: Podiatry

## 2019-04-09 ENCOUNTER — Emergency Department (HOSPITAL_COMMUNITY)
Admission: EM | Admit: 2019-04-09 | Discharge: 2019-04-09 | Disposition: A | Discharge status: Home / Self Care | Payer: Medicare Other | Attending: Emergency Medicine | Admitting: Emergency Medicine

## 2019-04-09 ENCOUNTER — Emergency Department (HOSPITAL_COMMUNITY): Payer: Medicare Other

## 2019-04-09 DIAGNOSIS — E1122 Type 2 diabetes mellitus with diabetic chronic kidney disease: Secondary | ICD-10-CM | POA: Insufficient documentation

## 2019-04-09 DIAGNOSIS — Y999 Unspecified external cause status: Secondary | ICD-10-CM | POA: Diagnosis not present

## 2019-04-09 DIAGNOSIS — F0390 Unspecified dementia without behavioral disturbance: Secondary | ICD-10-CM | POA: Diagnosis not present

## 2019-04-09 DIAGNOSIS — C61 Malignant neoplasm of prostate: Secondary | ICD-10-CM | POA: Insufficient documentation

## 2019-04-09 DIAGNOSIS — W19XXXA Unspecified fall, initial encounter: Secondary | ICD-10-CM

## 2019-04-09 DIAGNOSIS — R102 Pelvic and perineal pain: Secondary | ICD-10-CM | POA: Diagnosis not present

## 2019-04-09 DIAGNOSIS — Z743 Need for continuous supervision: Secondary | ICD-10-CM | POA: Diagnosis not present

## 2019-04-09 DIAGNOSIS — R279 Unspecified lack of coordination: Secondary | ICD-10-CM | POA: Diagnosis not present

## 2019-04-09 DIAGNOSIS — Z7982 Long term (current) use of aspirin: Secondary | ICD-10-CM | POA: Diagnosis not present

## 2019-04-09 DIAGNOSIS — Z7984 Long term (current) use of oral hypoglycemic drugs: Secondary | ICD-10-CM | POA: Diagnosis not present

## 2019-04-09 DIAGNOSIS — S0990XA Unspecified injury of head, initial encounter: Secondary | ICD-10-CM | POA: Diagnosis not present

## 2019-04-09 DIAGNOSIS — S199XXA Unspecified injury of neck, initial encounter: Secondary | ICD-10-CM | POA: Diagnosis not present

## 2019-04-09 DIAGNOSIS — Y939 Activity, unspecified: Secondary | ICD-10-CM | POA: Diagnosis not present

## 2019-04-09 DIAGNOSIS — S300XXA Contusion of lower back and pelvis, initial encounter: Secondary | ICD-10-CM | POA: Diagnosis not present

## 2019-04-09 DIAGNOSIS — R410 Disorientation, unspecified: Secondary | ICD-10-CM | POA: Diagnosis not present

## 2019-04-09 DIAGNOSIS — E039 Hypothyroidism, unspecified: Secondary | ICD-10-CM | POA: Insufficient documentation

## 2019-04-09 DIAGNOSIS — R456 Violent behavior: Secondary | ICD-10-CM | POA: Diagnosis not present

## 2019-04-09 DIAGNOSIS — S3993XA Unspecified injury of pelvis, initial encounter: Secondary | ICD-10-CM | POA: Diagnosis not present

## 2019-04-09 DIAGNOSIS — N183 Chronic kidney disease, stage 3 unspecified: Secondary | ICD-10-CM | POA: Insufficient documentation

## 2019-04-09 DIAGNOSIS — Y92121 Bathroom in nursing home as the place of occurrence of the external cause: Secondary | ICD-10-CM | POA: Insufficient documentation

## 2019-04-09 DIAGNOSIS — R5381 Other malaise: Secondary | ICD-10-CM | POA: Diagnosis not present

## 2019-04-09 DIAGNOSIS — S3992XA Unspecified injury of lower back, initial encounter: Secondary | ICD-10-CM | POA: Diagnosis present

## 2019-04-09 NOTE — ED Notes (Signed)
Notified [facility] for transportation 

## 2019-04-09 NOTE — ED Triage Notes (Signed)
Per EMS, patient is from memory unit and has frequent falls. Patient fell today, c/o pain in back of head. Patient has no visible signs of injury.

## 2019-04-09 NOTE — Discharge Instructions (Addendum)
You were evaluated in the emergency department for injuries from a fall.  You had a CAT scan of your head and cervical spine along with an x-ray of your pelvis that did not show any serious findings.  Please follow-up with your doctor and return to the emergency department if any worsening symptoms.

## 2019-04-09 NOTE — ED Provider Notes (Signed)
[place] [facility] DEPT Provider Note   CSN: [id number] Arrival date & time: 04/09/19  [id number]     History Chief Complaint  Patient presents with  . Fall    Yuriel Torno is a 86 y.o. male.  Level 5 Caveat secondary to dementia.  Patient is transported by EMS to here from his memory unit after being found on the ground after a likely fall.  Reportedly by EMS was found in the bathroom.  Patient states he has pain in his buttocks.  No headache shortness of breath abdominal pain numbness or weakness.  The history is provided by the patient.  Fall This is a recurrent problem. The problem occurs every several days. The problem has not changed since onset.Pertinent negatives include no chest pain, no abdominal pain, no headaches and no shortness of breath. Nothing aggravates the symptoms. Nothing relieves the symptoms. He has tried nothing for the symptoms. The treatment provided no relief.       Past Medical History:  Diagnosis Date  . Cancer ([place])   . Depression   . Diabetes mellitus without complication ([place])   . Prostate cancer ([place])   . Stroke [facility])     Patient Active Problem List   Diagnosis Date Noted  . Syncope 10/16/2018  . Elevated LDL cholesterol level 05/07/2018  . Elevated TSH 05/07/2018  . Hypothyroidism 05/07/2018  . Slow transit constipation 05/07/2018  . Insomnia 02/15/2018  . Bronchitis 02/07/2018  . Type II diabetes mellitus with renal manifestations ([place]) 01/29/2018  . HLD (hyperlipidemia) 01/29/2018  . Prostate cancer ([place]) 01/29/2018  . CKD (chronic kidney disease), stage III 01/29/2018  . Dizziness 01/29/2018  . Hospital discharge follow-up 01/11/2018  . Thrombocytopenia ([place]) 01/05/2018  . AKI (acute kidney injury) ([place]) 01/05/2018  . Lightheadedness 01/04/2018  . Stroke ([place]) 01/04/2018  . Recurrent prostate cancer ([place]) 10/24/2017  . Traumatic ecchymosis of abdominal wall 10/19/2017  . Macrocytosis without anemia  07/17/2017  . Macrocytosis 07/17/2017  . GERD (gastroesophageal reflux disease) 06/28/2017  . Chest pain 06/22/2017  . Elevated PSA, less than 10 ng/ml 04/17/2017  . Type 2 diabetes mellitus with complication, with long-term current use of insulin ([place]) 03/15/2017  . Elevated cholesterol 03/15/2017  . History of CVA (cerebrovascular accident) 03/15/2017  . Non-compliance 03/15/2017  . History of prostate cancer 03/15/2017  . Continuous leakage of urine 03/15/2017  . Erectile dysfunction following prostate ablative therapy 03/15/2017    Past Surgical History:  Procedure Laterality Date  . APPENDECTOMY    . CHOLECYSTECTOMY    . PROSTATECTOMY         Family History  Problem Relation Age of Onset  . Colon cancer Mother   . Stroke Father   . Diabetes Brother   . Breast cancer Neg Hx   . Prostate cancer Neg Hx   . Pancreatic cancer Neg Hx     Social History   Tobacco Use  . Smoking status: Never Smoker  . Smokeless tobacco: Never Used  Substance Use Topics  . Alcohol use: No  . Drug use: No    Home Medications Prior to Admission medications   Medication Sig Start Date End Date Taking? Authorizing Provider  aspirin EC 81 MG tablet Take 1 tablet (81 mg total) by mouth daily. 05/07/18   Libby Maw, MD  atorvastatin (LIPITOR) 40 MG tablet TAKE 1 TABLET(40 MG) BY MOUTH DAILY AT 6 PM Patient taking differently: Take 40 mg by mouth daily at 6 PM.  01/07/19  Libby Maw, MD  Ensure (ENSURE) Take 237 mLs by mouth 3 (three) times daily.    [provider]  LORazepam (ATIVAN) 0.5 MG tablet Take 0.5 mg by mouth 2 (two) times daily as needed. 03/25/19   [provider]  metFORMIN (GLUCOPHAGE) 500 MG tablet TAKE 1 TABLET BY MOUTH TWICE DAILY WITH MEAL Patient taking differently: Take 500 mg by mouth 2 (two) times daily with a meal.  05/08/18   Libby Maw, MD  mirtazapine (REMERON) 15 MG tablet Take 15 mg by mouth at bedtime. 04/01/19    [provider]  sertraline (ZOLOFT) 25 MG tablet Take 25 mg by mouth daily. 03/25/19   [provider]  Skin Protectants, Misc. (BAZA PROTECT EX) Apply 1 application topically 3 (three) times daily. Apply to buttocks for redness    [provider]  traZODone (DESYREL) 50 MG tablet TAKE 1 TABLET(50 MG) BY MOUTH AT BEDTIME AS NEEDED FOR SLEEP Patient taking differently: Take 50 mg by mouth at bedtime.  01/15/19   Libby Maw, MD  vitamin B-12 1000 MCG tablet Take 1 tablet (1,000 mcg total) by mouth daily. 01/06/18   Geradine Girt, DO    Allergies    Morphine and related, Motrin [ibuprofen], and Sulfa antibiotics  Review of Systems   Review of Systems  Unable to perform ROS: Dementia  Respiratory: Negative for shortness of breath.   Cardiovascular: Negative for chest pain.  Gastrointestinal: Negative for abdominal pain.  Neurological: Negative for headaches.    Physical Exam Updated Vital Signs BP (!) 156/80   Pulse 78   Temp 98.1 F (36.7 C) (Oral)   Resp 16   Ht 5\' 11"  (1.803 m)   SpO2 99%   BMI 27.03 kg/m   Physical Exam Vitals and nursing note reviewed.  Constitutional:      Appearance: He is well-developed.  HENT:     Head: Normocephalic and atraumatic.  Eyes:     Conjunctiva/sclera: Conjunctivae normal.  Cardiovascular:     Rate and Rhythm: Normal rate and regular rhythm.     Heart sounds: No murmur.  Pulmonary:     Effort: Pulmonary effort is normal. No respiratory distress.     Breath sounds: Normal breath sounds.  Abdominal:     Palpations: Abdomen is soft.     Tenderness: There is no abdominal tenderness.  Musculoskeletal:        General: No deformity or signs of injury. Normal range of motion.     Cervical back: Neck supple.  Skin:    General: Skin is warm and dry.     Capillary Refill: Capillary refill takes less than 2 seconds.  Neurological:     General: No focal deficit present.     Mental Status: He is  alert. Mental status is at baseline.     ED Results / Procedures / Treatments   Labs (all labs ordered are listed, but only abnormal results are displayed) Labs Reviewed - No data to display  EKG None  Radiology DG Pelvis 1-2 Views  Result Date: 04/09/2019 CLINICAL DATA:  Per EMS, patient is from memory unit and has frequent falls. Patient fell today, c/o pain in back of head and pelvic pain. Patient has no visible signs of injury EXAM: PELVIS - 1-2 VIEW COMPARISON:  04/04/2019 and previous FINDINGS: There is no evidence of pelvic fracture or diastasis. No pelvic bone lesions are seen. Stable left pelvic phlebolith. IMPRESSION: Negative. Electronically Signed   By: Keturah Barre  Vernard Gambles M.D.   On: 04/09/2019 10:16   CT Head Wo Contrast  Result Date: 04/09/2019 CLINICAL DATA:  Trauma, frequent falls EXAM: CT HEAD WITHOUT CONTRAST CT CERVICAL SPINE WITHOUT CONTRAST TECHNIQUE: Multidetector CT imaging of the head and cervical spine was performed following the standard protocol without intravenous contrast. Multiplanar CT image reconstructions of the cervical spine were also generated. COMPARISON:  04/04/2019 FINDINGS: CT HEAD FINDINGS Brain: No evidence of acute infarction, hemorrhage, hydrocephalus, extra-axial collection or mass lesion/mass effect. Encephalomalacic changes related to old left MCA, right posterior MCA, and right PCA distribution infarcts. Extensive small vessel ischemic changes. Global cortical and central atrophy. Secondary mild ventricular prominence. Vascular: Mild intracranial atherosclerosis. Skull: Normal. Negative for fracture or focal lesion. Sinuses/Orbits: The visualized paranasal sinuses are essentially clear. The mastoid air cells are unopacified. Other: None. CT CERVICAL SPINE FINDINGS Alignment: Normal cervical lordosis. Skull base and vertebrae: Vertebral body heights are maintained. Dens is intact. Soft tissues and spinal canal: Spinal canal is patent. Disc levels: Moderate  degenerative changes of the mid/lower cervical spine. Upper chest: Calcified granuloma in the medial right upper lobe, benign. Other: Visualized thyroid is unremarkable. IMPRESSION: No evidence of acute intracranial abnormality. Prior MCA and right PCA distribution infarcts. Atrophy with small vessel ischemic changes. No evidence of acute traumatic injury to the cervical spine. Moderate degenerative changes of the mid/lower cervical spine. Electronically Signed   By: Julian Hy M.D.   On: 04/09/2019 10:35   CT Cervical Spine Wo Contrast  Result Date: 04/09/2019 CLINICAL DATA:  Trauma, frequent falls EXAM: CT HEAD WITHOUT CONTRAST CT CERVICAL SPINE WITHOUT CONTRAST TECHNIQUE: Multidetector CT imaging of the head and cervical spine was performed following the standard protocol without intravenous contrast. Multiplanar CT image reconstructions of the cervical spine were also generated. COMPARISON:  04/04/2019 FINDINGS: CT HEAD FINDINGS Brain: No evidence of acute infarction, hemorrhage, hydrocephalus, extra-axial collection or mass lesion/mass effect. Encephalomalacic changes related to old left MCA, right posterior MCA, and right PCA distribution infarcts. Extensive small vessel ischemic changes. Global cortical and central atrophy. Secondary mild ventricular prominence. Vascular: Mild intracranial atherosclerosis. Skull: Normal. Negative for fracture or focal lesion. Sinuses/Orbits: The visualized paranasal sinuses are essentially clear. The mastoid air cells are unopacified. Other: None. CT CERVICAL SPINE FINDINGS Alignment: Normal cervical lordosis. Skull base and vertebrae: Vertebral body heights are maintained. Dens is intact. Soft tissues and spinal canal: Spinal canal is patent. Disc levels: Moderate degenerative changes of the mid/lower cervical spine. Upper chest: Calcified granuloma in the medial right upper lobe, benign. Other: Visualized thyroid is unremarkable. IMPRESSION: No evidence of acute  intracranial abnormality. Prior MCA and right PCA distribution infarcts. Atrophy with small vessel ischemic changes. No evidence of acute traumatic injury to the cervical spine. Moderate degenerative changes of the mid/lower cervical spine. Electronically Signed   By: Julian Hy M.D.   On: 04/09/2019 10:35    Procedures Procedures (including critical care time)  Medications Ordered in ED Medications - No data to display  ED Course  I have reviewed the triage vital signs and the nursing notes.  Pertinent labs & imaging results that were available during my care of the patient were reviewed by me and considered in my medical decision making (see chart for details).  Clinical Course as of Apr 08 1724  Tue Apr 09, 2067  2279 86 year old male with dementia here after presumed fall found on the floor.  No obvious signs of trauma.  Differential includes head bleed, stroke, skull fracture, cervical fracture,  pelvic fracture, contusion.   [MB]  1019 Pelvis x-ray interpreted by me as no gross fractures.   [MB]  1019 CT head and C-spine interpreted by me as no gross bleeds or fracture noted.  Awaiting radiology input.   [MB]  64 Patient's imaging officially read as no acute findings.  I removed his collar.  We will discharge him back to his facility.   [MB]  1055 Attempted to reach the patient's legal guardian his daughter without success.   [MB]    Clinical Course User Index [MB] Hayden Rasmussen, MD   MDM Rules/Calculators/A&P                      Final Clinical Impression(s) / ED Diagnoses Final diagnoses:  Fall, initial encounter  Contusion of buttock, initial encounter    Rx / DC Orders ED Discharge Orders    None       Hayden Rasmussen, MD 04/09/19 1727

## 2019-04-10 DIAGNOSIS — E782 Mixed hyperlipidemia: Secondary | ICD-10-CM | POA: Diagnosis not present

## 2019-04-10 DIAGNOSIS — R2689 Other abnormalities of gait and mobility: Secondary | ICD-10-CM | POA: Diagnosis not present

## 2019-04-10 DIAGNOSIS — I69398 Other sequelae of cerebral infarction: Secondary | ICD-10-CM | POA: Diagnosis not present

## 2019-04-10 DIAGNOSIS — N183 Chronic kidney disease, stage 3 unspecified: Secondary | ICD-10-CM | POA: Diagnosis not present

## 2019-04-10 DIAGNOSIS — E1122 Type 2 diabetes mellitus with diabetic chronic kidney disease: Secondary | ICD-10-CM | POA: Diagnosis not present

## 2019-04-10 DIAGNOSIS — D631 Anemia in chronic kidney disease: Secondary | ICD-10-CM | POA: Diagnosis not present

## 2019-04-11 DIAGNOSIS — E1122 Type 2 diabetes mellitus with diabetic chronic kidney disease: Secondary | ICD-10-CM | POA: Diagnosis not present

## 2019-04-11 DIAGNOSIS — R2689 Other abnormalities of gait and mobility: Secondary | ICD-10-CM | POA: Diagnosis not present

## 2019-04-11 DIAGNOSIS — D631 Anemia in chronic kidney disease: Secondary | ICD-10-CM | POA: Diagnosis not present

## 2019-04-11 DIAGNOSIS — I69398 Other sequelae of cerebral infarction: Secondary | ICD-10-CM | POA: Diagnosis not present

## 2019-04-11 DIAGNOSIS — E782 Mixed hyperlipidemia: Secondary | ICD-10-CM | POA: Diagnosis not present

## 2019-04-11 DIAGNOSIS — N183 Chronic kidney disease, stage 3 unspecified: Secondary | ICD-10-CM | POA: Diagnosis not present

## 2019-04-12 DIAGNOSIS — G4701 Insomnia due to medical condition: Secondary | ICD-10-CM | POA: Diagnosis not present

## 2019-04-12 DIAGNOSIS — F331 Major depressive disorder, recurrent, moderate: Secondary | ICD-10-CM | POA: Diagnosis not present

## 2019-04-12 DIAGNOSIS — F0151 Vascular dementia with behavioral disturbance: Secondary | ICD-10-CM | POA: Diagnosis not present

## 2019-04-15 DIAGNOSIS — R296 Repeated falls: Secondary | ICD-10-CM | POA: Diagnosis not present

## 2019-04-15 DIAGNOSIS — N183 Chronic kidney disease, stage 3 unspecified: Secondary | ICD-10-CM | POA: Diagnosis not present

## 2019-04-15 DIAGNOSIS — Z743 Need for continuous supervision: Secondary | ICD-10-CM | POA: Diagnosis not present

## 2019-04-15 DIAGNOSIS — F0391 Unspecified dementia with behavioral disturbance: Secondary | ICD-10-CM | POA: Diagnosis not present

## 2019-04-15 DIAGNOSIS — Z8673 Personal history of transient ischemic attack (TIA), and cerebral infarction without residual deficits: Secondary | ICD-10-CM | POA: Diagnosis not present

## 2019-04-15 DIAGNOSIS — Z66 Do not resuscitate: Secondary | ICD-10-CM | POA: Diagnosis not present

## 2019-04-15 DIAGNOSIS — G9389 Other specified disorders of brain: Secondary | ICD-10-CM | POA: Diagnosis not present

## 2019-04-15 DIAGNOSIS — F329 Major depressive disorder, single episode, unspecified: Secondary | ICD-10-CM | POA: Diagnosis not present

## 2019-04-15 DIAGNOSIS — I440 Atrioventricular block, first degree: Secondary | ICD-10-CM | POA: Diagnosis not present

## 2019-04-15 DIAGNOSIS — Z7984 Long term (current) use of oral hypoglycemic drugs: Secondary | ICD-10-CM | POA: Diagnosis not present

## 2019-04-15 DIAGNOSIS — F603 Borderline personality disorder: Secondary | ICD-10-CM | POA: Diagnosis not present

## 2019-04-15 DIAGNOSIS — F29 Unspecified psychosis not due to a substance or known physiological condition: Secondary | ICD-10-CM | POA: Diagnosis not present

## 2019-04-15 DIAGNOSIS — Z8546 Personal history of malignant neoplasm of prostate: Secondary | ICD-10-CM | POA: Diagnosis not present

## 2019-04-15 DIAGNOSIS — Z7982 Long term (current) use of aspirin: Secondary | ICD-10-CM | POA: Diagnosis not present

## 2019-04-15 DIAGNOSIS — R9089 Other abnormal findings on diagnostic imaging of central nervous system: Secondary | ICD-10-CM | POA: Diagnosis not present

## 2019-04-15 DIAGNOSIS — S32049A Unspecified fracture of fourth lumbar vertebra, initial encounter for closed fracture: Secondary | ICD-10-CM | POA: Diagnosis not present

## 2019-04-15 DIAGNOSIS — E1122 Type 2 diabetes mellitus with diabetic chronic kidney disease: Secondary | ICD-10-CM | POA: Diagnosis not present

## 2019-04-15 DIAGNOSIS — F0151 Vascular dementia with behavioral disturbance: Secondary | ICD-10-CM | POA: Diagnosis not present

## 2019-04-15 DIAGNOSIS — F3289 Other specified depressive episodes: Secondary | ICD-10-CM | POA: Diagnosis not present

## 2019-04-15 DIAGNOSIS — Z008 Encounter for other general examination: Secondary | ICD-10-CM | POA: Diagnosis not present

## 2019-04-15 DIAGNOSIS — R5381 Other malaise: Secondary | ICD-10-CM | POA: Diagnosis not present

## 2019-04-15 DIAGNOSIS — E119 Type 2 diabetes mellitus without complications: Secondary | ICD-10-CM | POA: Diagnosis not present

## 2019-04-15 DIAGNOSIS — R2689 Other abnormalities of gait and mobility: Secondary | ICD-10-CM | POA: Diagnosis not present

## 2019-04-15 DIAGNOSIS — D631 Anemia in chronic kidney disease: Secondary | ICD-10-CM | POA: Diagnosis not present

## 2019-04-15 DIAGNOSIS — E782 Mixed hyperlipidemia: Secondary | ICD-10-CM | POA: Diagnosis not present

## 2019-04-15 DIAGNOSIS — R4182 Altered mental status, unspecified: Secondary | ICD-10-CM | POA: Diagnosis not present

## 2019-04-15 DIAGNOSIS — N179 Acute kidney failure, unspecified: Secondary | ICD-10-CM | POA: Diagnosis not present

## 2019-04-15 DIAGNOSIS — J90 Pleural effusion, not elsewhere classified: Secondary | ICD-10-CM | POA: Diagnosis not present

## 2019-04-15 DIAGNOSIS — Z20822 Contact with and (suspected) exposure to covid-19: Secondary | ICD-10-CM | POA: Diagnosis not present

## 2019-04-15 DIAGNOSIS — I69398 Other sequelae of cerebral infarction: Secondary | ICD-10-CM | POA: Diagnosis not present

## 2019-04-16 DIAGNOSIS — I952 Hypotension due to drugs: Secondary | ICD-10-CM | POA: Diagnosis not present

## 2019-04-16 DIAGNOSIS — Z8546 Personal history of malignant neoplasm of prostate: Secondary | ICD-10-CM | POA: Diagnosis not present

## 2019-04-16 DIAGNOSIS — H1012 Acute atopic conjunctivitis, left eye: Secondary | ICD-10-CM | POA: Diagnosis not present

## 2019-04-16 DIAGNOSIS — R112 Nausea with vomiting, unspecified: Secondary | ICD-10-CM | POA: Diagnosis not present

## 2019-04-16 DIAGNOSIS — G4700 Insomnia, unspecified: Secondary | ICD-10-CM | POA: Diagnosis present

## 2019-04-16 DIAGNOSIS — K5900 Constipation, unspecified: Secondary | ICD-10-CM | POA: Diagnosis present

## 2019-04-16 DIAGNOSIS — D539 Nutritional anemia, unspecified: Secondary | ICD-10-CM | POA: Diagnosis present

## 2019-04-16 DIAGNOSIS — E1169 Type 2 diabetes mellitus with other specified complication: Secondary | ICD-10-CM | POA: Diagnosis not present

## 2019-04-16 DIAGNOSIS — E1122 Type 2 diabetes mellitus with diabetic chronic kidney disease: Secondary | ICD-10-CM | POA: Diagnosis present

## 2019-04-16 DIAGNOSIS — G319 Degenerative disease of nervous system, unspecified: Secondary | ICD-10-CM | POA: Diagnosis not present

## 2019-04-16 DIAGNOSIS — I82432 Acute embolism and thrombosis of left popliteal vein: Secondary | ICD-10-CM | POA: Diagnosis not present

## 2019-04-16 DIAGNOSIS — Z515 Encounter for palliative care: Secondary | ICD-10-CM | POA: Diagnosis not present

## 2019-04-16 DIAGNOSIS — E860 Dehydration: Secondary | ICD-10-CM | POA: Diagnosis not present

## 2019-04-16 DIAGNOSIS — I129 Hypertensive chronic kidney disease with stage 1 through stage 4 chronic kidney disease, or unspecified chronic kidney disease: Secondary | ICD-10-CM | POA: Diagnosis present

## 2019-04-16 DIAGNOSIS — R110 Nausea: Secondary | ICD-10-CM | POA: Diagnosis not present

## 2019-04-16 DIAGNOSIS — Z03818 Encounter for observation for suspected exposure to other biological agents ruled out: Secondary | ICD-10-CM | POA: Diagnosis not present

## 2019-04-16 DIAGNOSIS — N182 Chronic kidney disease, stage 2 (mild): Secondary | ICD-10-CM | POA: Diagnosis present

## 2019-04-16 DIAGNOSIS — Z043 Encounter for examination and observation following other accident: Secondary | ICD-10-CM | POA: Diagnosis not present

## 2019-04-16 DIAGNOSIS — R9431 Abnormal electrocardiogram [ECG] [EKG]: Secondary | ICD-10-CM | POA: Diagnosis present

## 2019-04-16 DIAGNOSIS — F0151 Vascular dementia with behavioral disturbance: Secondary | ICD-10-CM | POA: Diagnosis present

## 2019-04-16 DIAGNOSIS — Z8673 Personal history of transient ischemic attack (TIA), and cerebral infarction without residual deficits: Secondary | ICD-10-CM | POA: Diagnosis present

## 2019-04-16 DIAGNOSIS — F0390 Unspecified dementia without behavioral disturbance: Secondary | ICD-10-CM | POA: Diagnosis not present

## 2019-04-16 DIAGNOSIS — I82412 Acute embolism and thrombosis of left femoral vein: Secondary | ICD-10-CM | POA: Diagnosis not present

## 2019-04-16 DIAGNOSIS — S0990XA Unspecified injury of head, initial encounter: Secondary | ICD-10-CM | POA: Diagnosis not present

## 2019-04-16 DIAGNOSIS — F329 Major depressive disorder, single episode, unspecified: Secondary | ICD-10-CM | POA: Diagnosis present

## 2019-04-16 DIAGNOSIS — F0391 Unspecified dementia with behavioral disturbance: Secondary | ICD-10-CM | POA: Diagnosis not present

## 2019-04-16 DIAGNOSIS — I10 Essential (primary) hypertension: Secondary | ICD-10-CM | POA: Diagnosis not present

## 2019-04-16 DIAGNOSIS — J90 Pleural effusion, not elsewhere classified: Secondary | ICD-10-CM | POA: Diagnosis not present

## 2019-04-16 DIAGNOSIS — E039 Hypothyroidism, unspecified: Secondary | ICD-10-CM | POA: Diagnosis present

## 2019-04-16 DIAGNOSIS — S32040D Wedge compression fracture of fourth lumbar vertebra, subsequent encounter for fracture with routine healing: Secondary | ICD-10-CM | POA: Diagnosis not present

## 2019-04-16 DIAGNOSIS — Z23 Encounter for immunization: Secondary | ICD-10-CM | POA: Diagnosis not present

## 2019-04-16 DIAGNOSIS — I2699 Other pulmonary embolism without acute cor pulmonale: Secondary | ICD-10-CM | POA: Diagnosis not present

## 2019-04-16 DIAGNOSIS — G479 Sleep disorder, unspecified: Secondary | ICD-10-CM | POA: Diagnosis not present

## 2019-04-16 DIAGNOSIS — S22048A Other fracture of fourth thoracic vertebra, initial encounter for closed fracture: Secondary | ICD-10-CM | POA: Diagnosis not present

## 2019-04-16 DIAGNOSIS — E782 Mixed hyperlipidemia: Secondary | ICD-10-CM | POA: Diagnosis present

## 2019-04-16 DIAGNOSIS — R451 Restlessness and agitation: Secondary | ICD-10-CM | POA: Diagnosis not present

## 2019-04-16 DIAGNOSIS — F419 Anxiety disorder, unspecified: Secondary | ICD-10-CM | POA: Diagnosis not present

## 2019-04-16 DIAGNOSIS — E8809 Other disorders of plasma-protein metabolism, not elsewhere classified: Secondary | ICD-10-CM | POA: Diagnosis present

## 2019-04-16 DIAGNOSIS — R9389 Abnormal findings on diagnostic imaging of other specified body structures: Secondary | ICD-10-CM | POA: Diagnosis not present

## 2019-04-16 DIAGNOSIS — H1013 Acute atopic conjunctivitis, bilateral: Secondary | ICD-10-CM | POA: Diagnosis not present

## 2019-04-16 DIAGNOSIS — R7401 Elevation of levels of liver transaminase levels: Secondary | ICD-10-CM | POA: Diagnosis present

## 2019-04-16 DIAGNOSIS — K219 Gastro-esophageal reflux disease without esophagitis: Secondary | ICD-10-CM | POA: Diagnosis present

## 2019-04-16 DIAGNOSIS — S32049A Unspecified fracture of fourth lumbar vertebra, initial encounter for closed fracture: Secondary | ICD-10-CM | POA: Diagnosis not present

## 2019-04-16 DIAGNOSIS — E1159 Type 2 diabetes mellitus with other circulatory complications: Secondary | ICD-10-CM | POA: Diagnosis not present

## 2019-04-16 DIAGNOSIS — T434X5A Adverse effect of butyrophenone and thiothixene neuroleptics, initial encounter: Secondary | ICD-10-CM | POA: Diagnosis not present

## 2019-04-16 DIAGNOSIS — D696 Thrombocytopenia, unspecified: Secondary | ICD-10-CM | POA: Diagnosis present

## 2019-04-16 DIAGNOSIS — R5381 Other malaise: Secondary | ICD-10-CM | POA: Diagnosis present

## 2019-04-16 DIAGNOSIS — Z7189 Other specified counseling: Secondary | ICD-10-CM | POA: Diagnosis not present

## 2019-04-16 DIAGNOSIS — E872 Acidosis: Secondary | ICD-10-CM | POA: Diagnosis not present

## 2019-04-16 DIAGNOSIS — R579 Shock, unspecified: Secondary | ICD-10-CM | POA: Diagnosis not present

## 2019-04-16 DIAGNOSIS — S32040S Wedge compression fracture of fourth lumbar vertebra, sequela: Secondary | ICD-10-CM | POA: Diagnosis not present

## 2019-04-16 DIAGNOSIS — Z9114 Patient's other noncompliance with medication regimen: Secondary | ICD-10-CM | POA: Diagnosis not present

## 2019-04-16 DIAGNOSIS — M79652 Pain in left thigh: Secondary | ICD-10-CM | POA: Diagnosis not present

## 2019-04-16 DIAGNOSIS — S32040A Wedge compression fracture of fourth lumbar vertebra, initial encounter for closed fracture: Secondary | ICD-10-CM | POA: Diagnosis not present

## 2019-04-16 DIAGNOSIS — Z79899 Other long term (current) drug therapy: Secondary | ICD-10-CM | POA: Diagnosis present

## 2019-04-16 DIAGNOSIS — Z20822 Contact with and (suspected) exposure to covid-19: Secondary | ICD-10-CM | POA: Diagnosis present

## 2019-04-16 DIAGNOSIS — Z66 Do not resuscitate: Secondary | ICD-10-CM | POA: Diagnosis not present

## 2019-04-16 DIAGNOSIS — R4182 Altered mental status, unspecified: Secondary | ICD-10-CM | POA: Diagnosis not present

## 2019-04-16 DIAGNOSIS — R531 Weakness: Secondary | ICD-10-CM | POA: Diagnosis not present

## 2019-04-16 DIAGNOSIS — E079 Disorder of thyroid, unspecified: Secondary | ICD-10-CM | POA: Diagnosis not present

## 2019-04-16 DIAGNOSIS — Z7984 Long term (current) use of oral hypoglycemic drugs: Secondary | ICD-10-CM | POA: Diagnosis not present

## 2019-04-16 DIAGNOSIS — N179 Acute kidney failure, unspecified: Secondary | ICD-10-CM | POA: Diagnosis not present

## 2019-04-17 DIAGNOSIS — K5900 Constipation, unspecified: Secondary | ICD-10-CM | POA: Diagnosis not present

## 2019-04-17 DIAGNOSIS — E782 Mixed hyperlipidemia: Secondary | ICD-10-CM | POA: Diagnosis not present

## 2019-04-17 DIAGNOSIS — E079 Disorder of thyroid, unspecified: Secondary | ICD-10-CM | POA: Diagnosis not present

## 2019-04-17 DIAGNOSIS — E1169 Type 2 diabetes mellitus with other specified complication: Secondary | ICD-10-CM | POA: Diagnosis not present

## 2019-04-17 DIAGNOSIS — N182 Chronic kidney disease, stage 2 (mild): Secondary | ICD-10-CM | POA: Diagnosis not present

## 2019-04-17 DIAGNOSIS — K219 Gastro-esophageal reflux disease without esophagitis: Secondary | ICD-10-CM | POA: Diagnosis not present

## 2019-04-17 DIAGNOSIS — D539 Nutritional anemia, unspecified: Secondary | ICD-10-CM | POA: Diagnosis not present

## 2019-04-17 DIAGNOSIS — E1122 Type 2 diabetes mellitus with diabetic chronic kidney disease: Secondary | ICD-10-CM | POA: Diagnosis not present

## 2019-04-17 DIAGNOSIS — Z8546 Personal history of malignant neoplasm of prostate: Secondary | ICD-10-CM | POA: Diagnosis not present

## 2019-04-17 DIAGNOSIS — Z8673 Personal history of transient ischemic attack (TIA), and cerebral infarction without residual deficits: Secondary | ICD-10-CM | POA: Diagnosis not present

## 2019-04-17 DIAGNOSIS — F0151 Vascular dementia with behavioral disturbance: Secondary | ICD-10-CM | POA: Diagnosis not present

## 2019-04-17 DIAGNOSIS — Z03818 Encounter for observation for suspected exposure to other biological agents ruled out: Secondary | ICD-10-CM | POA: Diagnosis not present

## 2019-04-17 DIAGNOSIS — F0391 Unspecified dementia with behavioral disturbance: Secondary | ICD-10-CM | POA: Diagnosis not present

## 2019-04-18 DIAGNOSIS — F0391 Unspecified dementia with behavioral disturbance: Secondary | ICD-10-CM | POA: Diagnosis not present

## 2019-04-19 DIAGNOSIS — D539 Nutritional anemia, unspecified: Secondary | ICD-10-CM | POA: Diagnosis not present

## 2019-04-19 DIAGNOSIS — N182 Chronic kidney disease, stage 2 (mild): Secondary | ICD-10-CM | POA: Diagnosis not present

## 2019-04-19 DIAGNOSIS — R9431 Abnormal electrocardiogram [ECG] [EKG]: Secondary | ICD-10-CM | POA: Diagnosis not present

## 2019-04-19 DIAGNOSIS — R5381 Other malaise: Secondary | ICD-10-CM | POA: Diagnosis not present

## 2019-04-19 DIAGNOSIS — K5900 Constipation, unspecified: Secondary | ICD-10-CM | POA: Diagnosis not present

## 2019-04-19 DIAGNOSIS — E1122 Type 2 diabetes mellitus with diabetic chronic kidney disease: Secondary | ICD-10-CM | POA: Diagnosis not present

## 2019-04-22 DIAGNOSIS — F0391 Unspecified dementia with behavioral disturbance: Secondary | ICD-10-CM | POA: Diagnosis not present

## 2019-04-23 DIAGNOSIS — F0391 Unspecified dementia with behavioral disturbance: Secondary | ICD-10-CM | POA: Diagnosis not present

## 2019-04-24 DIAGNOSIS — F0391 Unspecified dementia with behavioral disturbance: Secondary | ICD-10-CM | POA: Diagnosis not present

## 2019-04-25 DIAGNOSIS — F0391 Unspecified dementia with behavioral disturbance: Secondary | ICD-10-CM | POA: Diagnosis not present

## 2019-04-25 DIAGNOSIS — Z043 Encounter for examination and observation following other accident: Secondary | ICD-10-CM | POA: Diagnosis not present

## 2019-04-25 DIAGNOSIS — S22048A Other fracture of fourth thoracic vertebra, initial encounter for closed fracture: Secondary | ICD-10-CM | POA: Diagnosis not present

## 2019-04-25 DIAGNOSIS — S0990XA Unspecified injury of head, initial encounter: Secondary | ICD-10-CM | POA: Diagnosis not present

## 2019-04-25 DIAGNOSIS — M79652 Pain in left thigh: Secondary | ICD-10-CM | POA: Diagnosis not present

## 2019-04-26 DIAGNOSIS — F0391 Unspecified dementia with behavioral disturbance: Secondary | ICD-10-CM | POA: Diagnosis not present

## 2019-04-27 DIAGNOSIS — F0391 Unspecified dementia with behavioral disturbance: Secondary | ICD-10-CM | POA: Diagnosis not present

## 2019-04-27 DIAGNOSIS — S32040A Wedge compression fracture of fourth lumbar vertebra, initial encounter for closed fracture: Secondary | ICD-10-CM | POA: Diagnosis not present

## 2019-04-28 DIAGNOSIS — F0391 Unspecified dementia with behavioral disturbance: Secondary | ICD-10-CM | POA: Diagnosis not present

## 2019-05-03 DIAGNOSIS — K5900 Constipation, unspecified: Secondary | ICD-10-CM | POA: Diagnosis not present

## 2019-05-03 DIAGNOSIS — S32040S Wedge compression fracture of fourth lumbar vertebra, sequela: Secondary | ICD-10-CM | POA: Diagnosis not present

## 2019-05-03 DIAGNOSIS — E1122 Type 2 diabetes mellitus with diabetic chronic kidney disease: Secondary | ICD-10-CM | POA: Diagnosis not present

## 2019-05-03 DIAGNOSIS — N182 Chronic kidney disease, stage 2 (mild): Secondary | ICD-10-CM | POA: Diagnosis not present

## 2019-05-27 ENCOUNTER — Telehealth: Payer: Self-pay | Admitting: Family Medicine

## 2019-05-27 NOTE — Telephone Encounter (Signed)
Patient wife is calling and wanted to speak to Dr. Ethelene Hal about getting a FL2 form. Wife is requested a call back from Dr. Ethelene Hal. Pls advise. CB is [phone]

## 2019-05-27 NOTE — Telephone Encounter (Signed)
Patient's wife is requesting a phone call from doctor only to ask about FL2 form. Please call when you get a chance.

## 2019-05-28 NOTE — Telephone Encounter (Signed)
Not sure that FL 2 is the correct form. Neither have been employed for years. What is her question.

## 2019-05-28 NOTE — Telephone Encounter (Signed)
Dr. Tamela Gammon  Spoke with patient who states that she is needing a FL2 form filled out so that Mr. Rosalee Kaufman could be moved back to his original facility Mrs. Rosalee Kaufman states that she does not feel like patient should be seen at a memory recovery facility.   Spoke with Dr. Ethelene Hal regarding patients concerns and informed him of Mrs. Roxanne Gates reason for the Fl2 form per Dr. Ethelene Hal he feels like patient should be seen at a memory facility and the form is out of his control to deal with since patient also being seen by doctors at his current facility.   Mrs. Rosalee Kaufman aware of this, no other concerns at this time.

## 2019-07-15 DIAGNOSIS — E782 Mixed hyperlipidemia: Secondary | ICD-10-CM | POA: Diagnosis present

## 2019-07-15 DIAGNOSIS — Z8673 Personal history of transient ischemic attack (TIA), and cerebral infarction without residual deficits: Secondary | ICD-10-CM | POA: Diagnosis not present

## 2019-07-15 DIAGNOSIS — I69398 Other sequelae of cerebral infarction: Secondary | ICD-10-CM | POA: Diagnosis not present

## 2019-07-15 DIAGNOSIS — I2699 Other pulmonary embolism without acute cor pulmonale: Secondary | ICD-10-CM | POA: Diagnosis present

## 2019-07-15 DIAGNOSIS — R451 Restlessness and agitation: Secondary | ICD-10-CM | POA: Diagnosis present

## 2019-07-15 DIAGNOSIS — R1310 Dysphagia, unspecified: Secondary | ICD-10-CM | POA: Diagnosis not present

## 2019-07-15 DIAGNOSIS — I517 Cardiomegaly: Secondary | ICD-10-CM | POA: Diagnosis not present

## 2019-07-15 DIAGNOSIS — I10 Essential (primary) hypertension: Secondary | ICD-10-CM | POA: Diagnosis not present

## 2019-07-15 DIAGNOSIS — Z7189 Other specified counseling: Secondary | ICD-10-CM | POA: Diagnosis not present

## 2019-07-15 DIAGNOSIS — F329 Major depressive disorder, single episode, unspecified: Secondary | ICD-10-CM | POA: Diagnosis present

## 2019-07-15 DIAGNOSIS — I82412 Acute embolism and thrombosis of left femoral vein: Secondary | ICD-10-CM | POA: Diagnosis present

## 2019-07-15 DIAGNOSIS — G4700 Insomnia, unspecified: Secondary | ICD-10-CM | POA: Diagnosis present

## 2019-07-15 DIAGNOSIS — D696 Thrombocytopenia, unspecified: Secondary | ICD-10-CM | POA: Diagnosis not present

## 2019-07-15 DIAGNOSIS — E785 Hyperlipidemia, unspecified: Secondary | ICD-10-CM | POA: Diagnosis not present

## 2019-07-15 DIAGNOSIS — I82419 Acute embolism and thrombosis of unspecified femoral vein: Secondary | ICD-10-CM | POA: Diagnosis not present

## 2019-07-15 DIAGNOSIS — N179 Acute kidney failure, unspecified: Secondary | ICD-10-CM | POA: Diagnosis present

## 2019-07-15 DIAGNOSIS — I5021 Acute systolic (congestive) heart failure: Secondary | ICD-10-CM | POA: Diagnosis not present

## 2019-07-15 DIAGNOSIS — I129 Hypertensive chronic kidney disease with stage 1 through stage 4 chronic kidney disease, or unspecified chronic kidney disease: Secondary | ICD-10-CM | POA: Diagnosis present

## 2019-07-15 DIAGNOSIS — Z9981 Dependence on supplemental oxygen: Secondary | ICD-10-CM | POA: Diagnosis not present

## 2019-07-15 DIAGNOSIS — S32040S Wedge compression fracture of fourth lumbar vertebra, sequela: Secondary | ICD-10-CM | POA: Diagnosis not present

## 2019-07-15 DIAGNOSIS — I952 Hypotension due to drugs: Secondary | ICD-10-CM | POA: Diagnosis present

## 2019-07-15 DIAGNOSIS — E1122 Type 2 diabetes mellitus with diabetic chronic kidney disease: Secondary | ICD-10-CM | POA: Diagnosis present

## 2019-07-15 DIAGNOSIS — R578 Other shock: Secondary | ICD-10-CM | POA: Diagnosis present

## 2019-07-15 DIAGNOSIS — R0902 Hypoxemia: Secondary | ICD-10-CM | POA: Diagnosis not present

## 2019-07-15 DIAGNOSIS — R5381 Other malaise: Secondary | ICD-10-CM | POA: Diagnosis present

## 2019-07-15 DIAGNOSIS — R1311 Dysphagia, oral phase: Secondary | ICD-10-CM | POA: Diagnosis present

## 2019-07-15 DIAGNOSIS — F0390 Unspecified dementia without behavioral disturbance: Secondary | ICD-10-CM | POA: Diagnosis not present

## 2019-07-15 DIAGNOSIS — Z515 Encounter for palliative care: Secondary | ICD-10-CM | POA: Diagnosis not present

## 2019-07-15 DIAGNOSIS — F0151 Vascular dementia with behavioral disturbance: Secondary | ICD-10-CM | POA: Diagnosis present

## 2019-07-15 DIAGNOSIS — E1169 Type 2 diabetes mellitus with other specified complication: Secondary | ICD-10-CM | POA: Diagnosis not present

## 2019-07-15 DIAGNOSIS — E079 Disorder of thyroid, unspecified: Secondary | ICD-10-CM | POA: Diagnosis not present

## 2019-07-15 DIAGNOSIS — Z8546 Personal history of malignant neoplasm of prostate: Secondary | ICD-10-CM | POA: Diagnosis not present

## 2019-07-15 DIAGNOSIS — Z8701 Personal history of pneumonia (recurrent): Secondary | ICD-10-CM | POA: Diagnosis not present

## 2019-07-15 DIAGNOSIS — I071 Rheumatic tricuspid insufficiency: Secondary | ICD-10-CM | POA: Diagnosis not present

## 2019-07-15 DIAGNOSIS — E872 Acidosis: Secondary | ICD-10-CM | POA: Diagnosis present

## 2019-07-15 DIAGNOSIS — N182 Chronic kidney disease, stage 2 (mild): Secondary | ICD-10-CM | POA: Diagnosis present

## 2019-07-15 DIAGNOSIS — E039 Hypothyroidism, unspecified: Secondary | ICD-10-CM | POA: Diagnosis present

## 2019-07-15 DIAGNOSIS — I82432 Acute embolism and thrombosis of left popliteal vein: Secondary | ICD-10-CM | POA: Diagnosis present

## 2019-07-15 DIAGNOSIS — K5900 Constipation, unspecified: Secondary | ICD-10-CM | POA: Diagnosis present

## 2019-07-15 DIAGNOSIS — F419 Anxiety disorder, unspecified: Secondary | ICD-10-CM | POA: Diagnosis not present

## 2019-07-15 DIAGNOSIS — Z66 Do not resuscitate: Secondary | ICD-10-CM | POA: Diagnosis present

## 2019-07-15 DIAGNOSIS — R110 Nausea: Secondary | ICD-10-CM | POA: Diagnosis not present

## 2019-07-15 DIAGNOSIS — S32040A Wedge compression fracture of fourth lumbar vertebra, initial encounter for closed fracture: Secondary | ICD-10-CM | POA: Diagnosis not present

## 2019-07-15 DIAGNOSIS — I82402 Acute embolism and thrombosis of unspecified deep veins of left lower extremity: Secondary | ICD-10-CM | POA: Diagnosis not present

## 2019-07-15 DIAGNOSIS — R579 Shock, unspecified: Secondary | ICD-10-CM | POA: Diagnosis not present

## 2019-07-15 DIAGNOSIS — T434X5A Adverse effect of butyrophenone and thiothixene neuroleptics, initial encounter: Secondary | ICD-10-CM | POA: Diagnosis present

## 2019-07-15 DIAGNOSIS — I959 Hypotension, unspecified: Secondary | ICD-10-CM | POA: Diagnosis not present

## 2019-07-27 DEATH — deceased

## 2019-09-02 IMAGING — MR MR MRA HEAD W/O CM
2 of 3 series · 18 of 48 positions shown · non-contrast
Comparison: Limited brain MRI performed earlier the same day.

CLINICAL DATA: Initial evaluation for acute stroke.

EXAM:
MRA HEAD WITHOUT CONTRAST
MRA NECK WITHOUT CONTRAST
TECHNIQUE: Angiographic images of the Circle of Willis were obtained using MRA
technique without intravenous contrast. Angiographic images of the
neck were obtained using MRA technique without intravenous contrast.
Carotid stenosis measurements (when applicable) are obtained
utilizing NASCET criteria, using the distal internal carotid
diameter as the denominator.

[Series 3: ax (id) 2 · axial · 1.0mm · 0.43mm/px · z∈[-41,+50]mm · 9 of 184 slices shown]
[im 1/184]
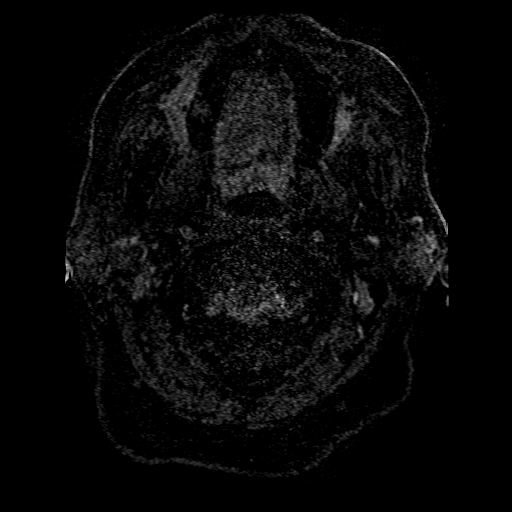
[im 31/184]
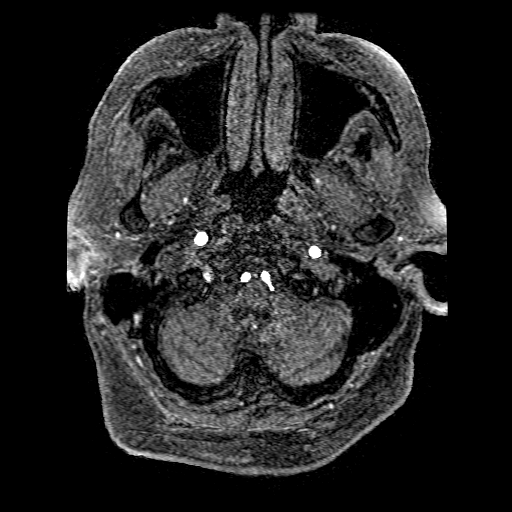
[im 62/184]
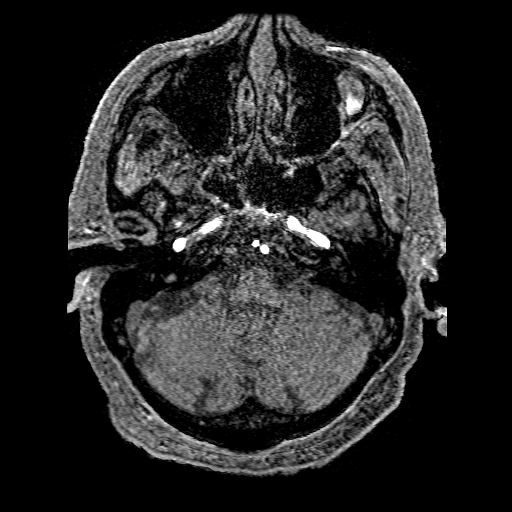
[im 77/184]
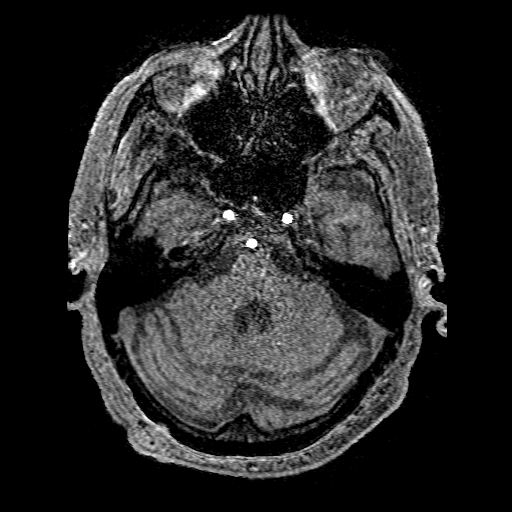
[im 92/184]
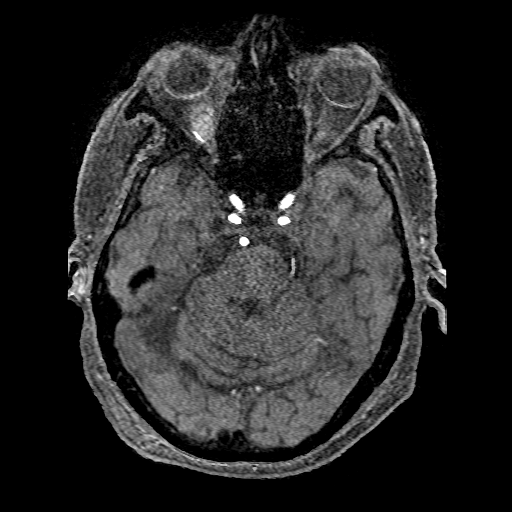
[im 107/184]
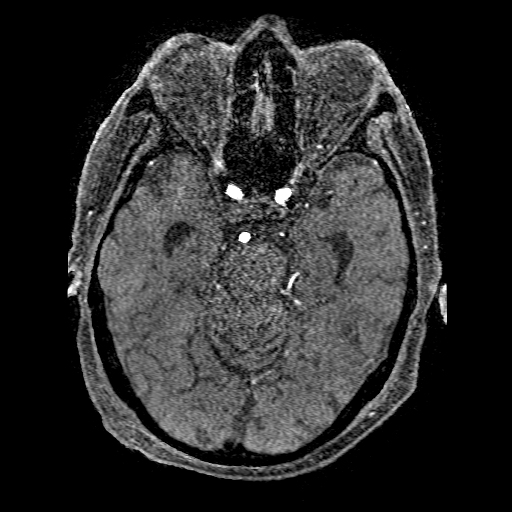
[im 123/184]
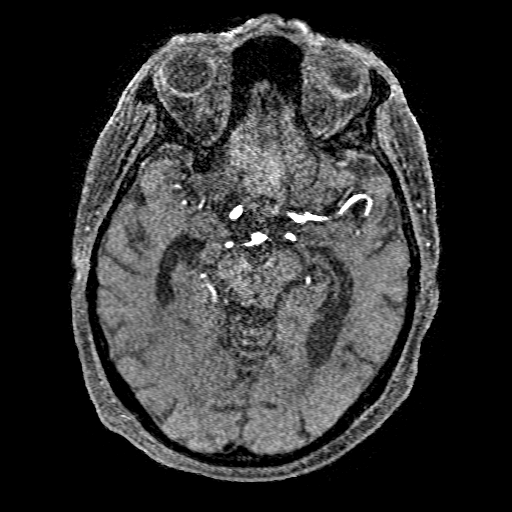
[im 153/184]
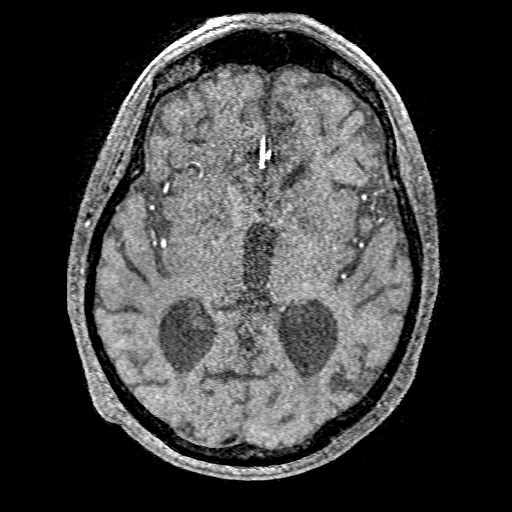
[im 184/184]
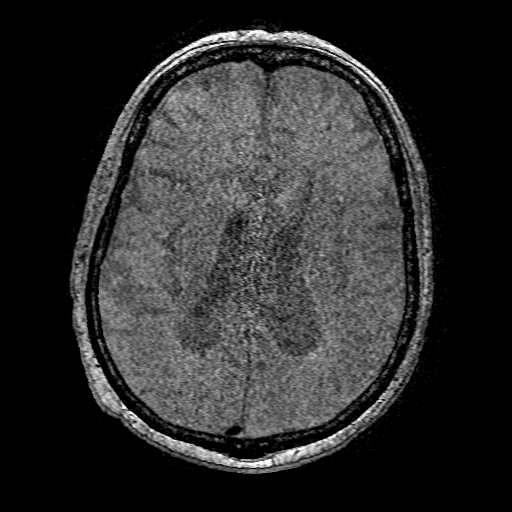

[Series 6: sag inhance (id) · sagittal · 1.2mm · 0.47mm/px · 9 of 358 slices shown]
[im 15/358]
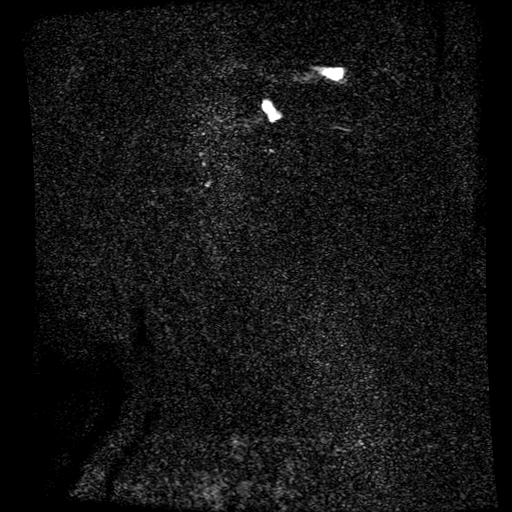
[im 60/358]
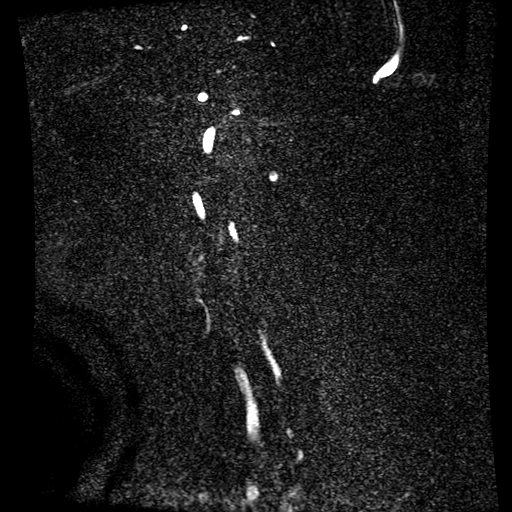
[im 105/358]
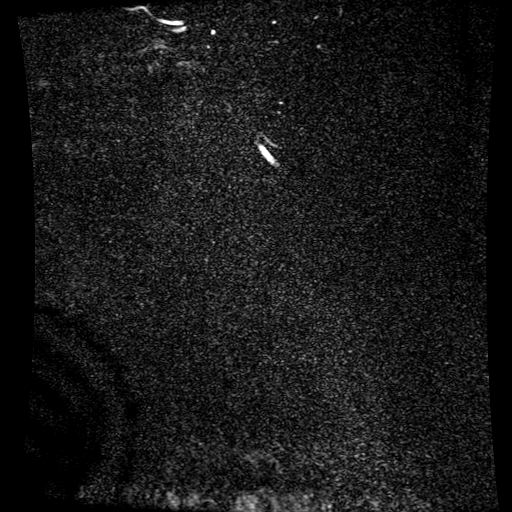
[im 149/358]
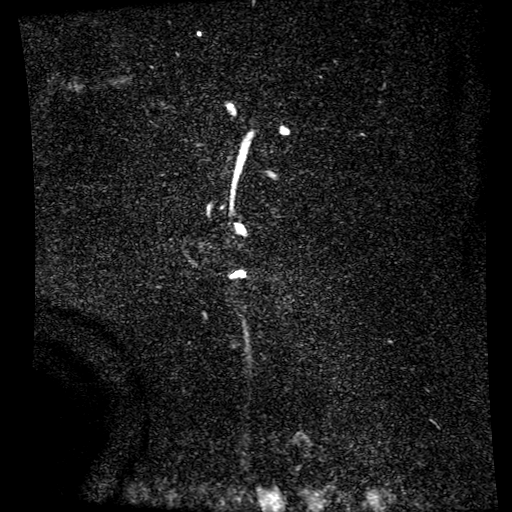
[im 179/358]
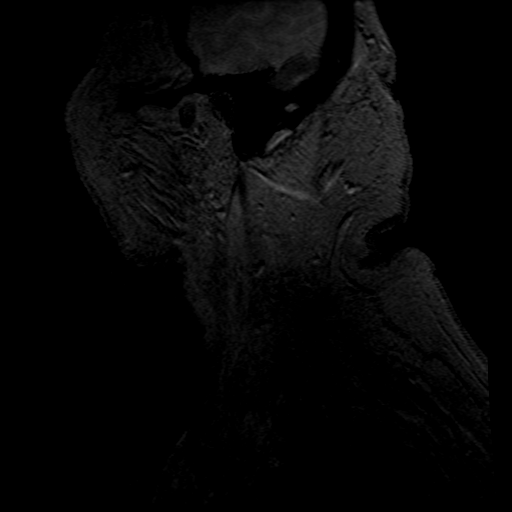
[im 209/358]
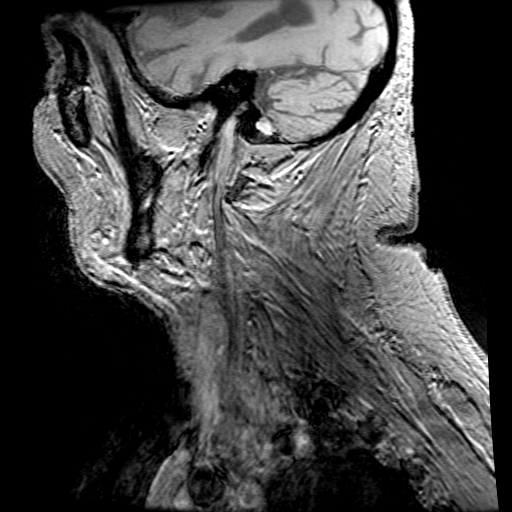
[im 253/358]
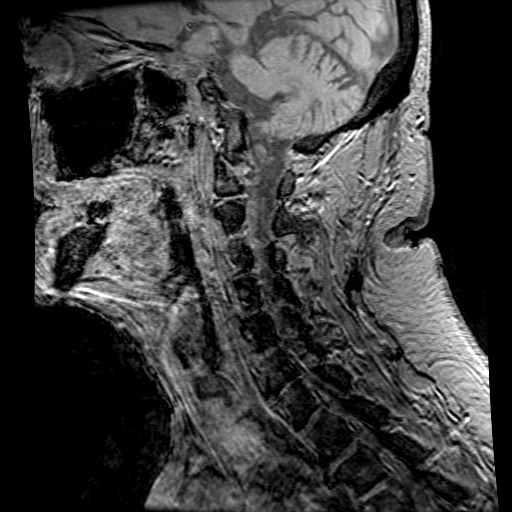
[im 298/358]
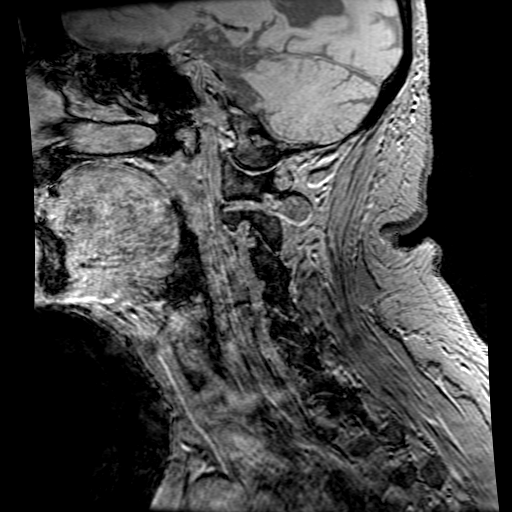
[im 343/358]
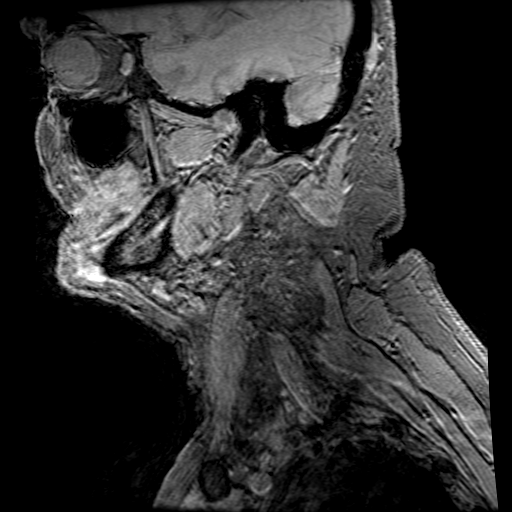

[18 of 48 positions shown; findings below may reference images not displayed]

FINDINGS: MRA HEAD FINDINGS

Distal cervical segments of the internal carotid arteries are patent
with antegrade flow. Petrous, cavernous, and supraclinoid segments
patent without hemodynamically significant stenosis. Origin of the
ophthalmic arteries patent bilaterally. ICA termini widely patent.
A1 segments, anterior communicating artery common anterior cerebral
arteries widely patent and normal. No M1 stenosis or occlusion.
Normal MCA bifurcations. Distal MCA branches well perfused and
symmetric. Mild distal small vessel atheromatous irregularity.

Vertebral arteries widely patent to the vertebrobasilar junction
without stenosis. Posterior inferior cerebral arteries patent
bilaterally. Basilar artery widely patent to its distal aspect.
Superior cerebellar and posterior cerebral arteries widely patent
bilaterally. Mild distal small vessel atheromatous irregularity.

No aneurysm.

MRA NECK FINDINGS

Examination technically limited by lack of IV contrast.

Aortic arch and origin of the great vessels not well assessed on
this examination.

Visualized right common carotid artery widely patent to the
bifurcation without stenosis. Mild atheromatous narrowing about the
proximal right ICA without significant stenosis. Right ICA otherwise
widely patent to the skull base without flow-limiting stenosis or
occlusion.

Visualized left common carotid artery widely patent to the
bifurcation. Mild smooth atheromatous narrowing at the origin of the
left ICA. Left ICA otherwise widely patent to the skull base without
stenosis or occlusion.

Origin of the vertebral arteries not well assessed on this exam.
Vertebral arteries otherwise widely patent to the vertebrobasilar
junction without stenosis or occlusion.
IMPRESSION: MRA HEAD IMPRESSION:

1. Negative intracranial MRA. No large vessel occlusion. No
hemodynamically significant or correctable stenosis.
2. Mild distal small vessel atheromatous irregularity.

MRA NECK IMPRESSION:

Negative MRA of the neck. No vascular occlusion or hemodynamically
significant stenosis identified.
